# Patient Record
Sex: Male | Born: 1935
Health system: Southern US, Community
[De-identification: ages and names within clinical notes are randomized; demographics above are authoritative.]

## PROBLEM LIST (undated history)

## (undated) ENCOUNTER — Ambulatory Visit: Admission: EM | Payer: Medicare HMO | Source: Home / Self Care

## (undated) DIAGNOSIS — E7800 Pure hypercholesterolemia, unspecified: Secondary | ICD-10-CM

## (undated) DIAGNOSIS — H9190 Unspecified hearing loss, unspecified ear: Secondary | ICD-10-CM

## (undated) DIAGNOSIS — K219 Gastro-esophageal reflux disease without esophagitis: Secondary | ICD-10-CM

## (undated) DIAGNOSIS — I10 Essential (primary) hypertension: Secondary | ICD-10-CM

## (undated) DIAGNOSIS — T7840XA Allergy, unspecified, initial encounter: Secondary | ICD-10-CM

## (undated) DIAGNOSIS — E785 Hyperlipidemia, unspecified: Secondary | ICD-10-CM

## (undated) DIAGNOSIS — I219 Acute myocardial infarction, unspecified: Secondary | ICD-10-CM

## (undated) HISTORY — PX: CARDIAC CATHETERIZATION: SHX172

## (undated) HISTORY — PX: FRACTURE SURGERY: SHX138

## (undated) HISTORY — PX: TONSILLECTOMY: SUR1361

## (undated) HISTORY — DX: Allergy, unspecified, initial encounter: T78.40XA

## (undated) HISTORY — DX: Hyperlipidemia, unspecified: E78.5

## (undated) HISTORY — DX: Acute myocardial infarction, unspecified: I21.9

## (undated) HISTORY — DX: Essential (primary) hypertension: I10

---

## 1998-04-03 ENCOUNTER — Ambulatory Visit (HOSPITAL_COMMUNITY): Admission: RE | Admit: 1998-04-03 | Discharge: 1998-04-03 | Payer: Self-pay

## 1998-10-13 ENCOUNTER — Ambulatory Visit (HOSPITAL_COMMUNITY): Admission: RE | Admit: 1998-10-13 | Discharge: 1998-10-13 | Payer: Self-pay | Admitting: *Deleted

## 2004-10-23 ENCOUNTER — Inpatient Hospital Stay (HOSPITAL_COMMUNITY): Admission: EM | Admit: 2004-10-23 | Discharge: 2004-10-25 | Payer: Self-pay | Admitting: Emergency Medicine

## 2004-11-09 ENCOUNTER — Emergency Department (HOSPITAL_COMMUNITY): Admission: EM | Admit: 2004-11-09 | Discharge: 2004-11-09 | Payer: Self-pay | Admitting: Emergency Medicine

## 2004-11-13 ENCOUNTER — Ambulatory Visit (HOSPITAL_COMMUNITY): Admission: RE | Admit: 2004-11-13 | Discharge: 2004-11-13 | Payer: Self-pay | Admitting: Cardiology

## 2004-11-25 ENCOUNTER — Ambulatory Visit: Payer: Self-pay | Admitting: Cardiology

## 2004-11-29 ENCOUNTER — Ambulatory Visit (HOSPITAL_COMMUNITY): Admission: RE | Admit: 2004-11-29 | Discharge: 2004-11-30 | Payer: Self-pay | Admitting: Cardiology

## 2005-01-15 ENCOUNTER — Ambulatory Visit: Payer: Self-pay | Admitting: Cardiology

## 2005-04-09 ENCOUNTER — Ambulatory Visit: Payer: Self-pay | Admitting: Cardiology

## 2005-06-25 ENCOUNTER — Ambulatory Visit: Payer: Self-pay | Admitting: Cardiology

## 2005-07-10 ENCOUNTER — Ambulatory Visit: Payer: Self-pay | Admitting: Cardiology

## 2005-09-24 ENCOUNTER — Emergency Department (HOSPITAL_COMMUNITY): Admission: EM | Admit: 2005-09-24 | Discharge: 2005-09-25 | Payer: Self-pay | Admitting: Emergency Medicine

## 2005-10-10 ENCOUNTER — Ambulatory Visit: Payer: Self-pay | Admitting: Cardiology

## 2006-01-12 ENCOUNTER — Ambulatory Visit: Payer: Self-pay | Admitting: Cardiology

## 2012-09-27 ENCOUNTER — Emergency Department (HOSPITAL_COMMUNITY): Payer: Medicare Other

## 2012-09-27 ENCOUNTER — Encounter (HOSPITAL_COMMUNITY): Payer: Self-pay | Admitting: *Deleted

## 2012-09-27 ENCOUNTER — Emergency Department (HOSPITAL_COMMUNITY)
Admission: EM | Admit: 2012-09-27 | Discharge: 2012-09-27 | Disposition: A | Discharge status: Home / Self Care | Payer: Medicare Other | Attending: Emergency Medicine | Admitting: Emergency Medicine

## 2012-09-27 DIAGNOSIS — Z79899 Other long term (current) drug therapy: Secondary | ICD-10-CM | POA: Insufficient documentation

## 2012-09-27 DIAGNOSIS — Z7982 Long term (current) use of aspirin: Secondary | ICD-10-CM | POA: Insufficient documentation

## 2012-09-27 DIAGNOSIS — Z7902 Long term (current) use of antithrombotics/antiplatelets: Secondary | ICD-10-CM | POA: Insufficient documentation

## 2012-09-27 DIAGNOSIS — M549 Dorsalgia, unspecified: Secondary | ICD-10-CM | POA: Insufficient documentation

## 2012-09-27 DIAGNOSIS — M519 Unspecified thoracic, thoracolumbar and lumbosacral intervertebral disc disorder: Secondary | ICD-10-CM

## 2012-09-27 DIAGNOSIS — M5137 Other intervertebral disc degeneration, lumbosacral region: Secondary | ICD-10-CM | POA: Insufficient documentation

## 2012-09-27 DIAGNOSIS — M51379 Other intervertebral disc degeneration, lumbosacral region without mention of lumbar back pain or lower extremity pain: Secondary | ICD-10-CM | POA: Insufficient documentation

## 2012-09-27 LAB — CBC WITH DIFFERENTIAL/PLATELET
Basophils Absolute: 0 10*3/uL (ref 0.0–0.1)
Eosinophils Absolute: 0 10*3/uL (ref 0.0–0.7)
Eosinophils Relative: 0 % (ref 0–5)
HCT: 36.2 % — ABNORMAL LOW (ref 39.0–52.0)
MCH: 27.6 pg (ref 26.0–34.0)
MCHC: 33.1 g/dL (ref 30.0–36.0)
MCV: 83.4 fL (ref 78.0–100.0)
Monocytes Absolute: 0.5 10*3/uL (ref 0.1–1.0)
Platelets: 137 10*3/uL — ABNORMAL LOW (ref 150–400)
RDW: 13.2 % (ref 11.5–15.5)

## 2012-09-27 LAB — URINALYSIS, ROUTINE W REFLEX MICROSCOPIC
Bilirubin Urine: NEGATIVE
Hgb urine dipstick: NEGATIVE
Nitrite: NEGATIVE
Specific Gravity, Urine: 1.017 (ref 1.005–1.030)
Urobilinogen, UA: 0.2 mg/dL (ref 0.0–1.0)
pH: 7 (ref 5.0–8.0)

## 2012-09-27 LAB — COMPREHENSIVE METABOLIC PANEL
ALT: 15 U/L (ref 0–53)
AST: 20 U/L (ref 0–37)
Calcium: 8.9 mg/dL (ref 8.4–10.5)
Creatinine, Ser: 0.66 mg/dL (ref 0.50–1.35)
GFR calc Af Amer: >90 mL/min (ref >90)
Sodium: 139 mEq/L (ref 135–145)
Total Protein: 6.5 g/dL (ref 6.0–8.3)

## 2012-09-27 MED ORDER — KETOROLAC TROMETHAMINE 30 MG/ML IJ SOLN
15.0000 mg | Freq: Once | INTRAMUSCULAR | Status: AC
  Administered 2012-09-27: 15 mg via INTRAVENOUS
  Filled 2012-09-27: qty 1

## 2012-09-27 MED ORDER — HYDROCODONE-ACETAMINOPHEN 5-325 MG PO TABS: 1.0000 | ORAL_TABLET | Freq: Four times a day (QID) | ORAL | Status: DC | PRN

## 2012-09-27 MED ORDER — ONDANSETRON HCL 4 MG/2ML IJ SOLN
4.0000 mg | Freq: Once | INTRAMUSCULAR | Status: AC
  Administered 2012-09-27: 4 mg via INTRAVENOUS
  Filled 2012-09-27: qty 2

## 2012-09-27 MED ORDER — MORPHINE SULFATE 4 MG/ML IJ SOLN
4.0000 mg | Freq: Once | INTRAMUSCULAR | Status: AC
  Administered 2012-09-27: 4 mg via INTRAVENOUS
  Filled 2012-09-27: qty 1

## 2012-09-27 MED ORDER — CYCLOBENZAPRINE HCL 10 MG PO TABS: 10.0000 mg | ORAL_TABLET | Freq: Two times a day (BID) | ORAL | Status: DC | PRN

## 2012-09-27 NOTE — ED Notes (Signed)
PT called EMS for Flank pain that did not resolve. Pt has had same pain in oast. On arrival Pt A/O ,no acute distress noted.

## 2012-09-27 NOTE — ED Provider Notes (Addendum)
History     CSN: [id number]  Arrival date & time 09/27/12  1346   First MD Initiated Contact with Patient 09/27/12 1405      Chief Complaint  Patient presents with  . Flank Pain    LT    (Consider location/radiation/quality/duration/timing/severity/associated sxs/prior treatment) Patient is a 77 y.o. male presenting with flank pain. The history is provided by the patient.  Flank Pain This is a new problem. The current episode started 3 to 5 hours ago. The problem occurs constantly. The problem has not changed since onset.Pertinent negatives include no chest pain, no abdominal pain and no shortness of breath. Exacerbated by: lying flat. Nothing relieves the symptoms. Treatments tried: cranberry pills and tylenol. The treatment provided no relief.    No past medical history on file.  No past surgical history on file.  No family history on file.  History  Substance Use Topics  . Smoking status: Not on file  . Smokeless tobacco: Not on file  . Alcohol Use: Not on file      Review of Systems  Constitutional: Negative for fever.  Respiratory: Negative for shortness of breath.   Cardiovascular: Negative for chest pain.  Gastrointestinal: Negative for abdominal pain.  Genitourinary: Positive for flank pain.  All other systems reviewed and are negative.    Allergies  Review of patient's allergies indicates no known allergies.  Home Medications   Current Outpatient Rx  Name  Route  Sig  Dispense  Refill  . ACETAMINOPHEN ER 650 MG PO TBCR   Oral   Take 1,300 mg by mouth every 8 (eight) hours as needed. For pain         . ASPIRIN EC 81 MG PO TBEC   Oral   Take 81 mg by mouth daily.         Marland Kitchen VITAMIN D 1000 UNITS PO TABS   Oral   Take 1,000 Units by mouth daily.         Marland Kitchen CLOPIDOGREL BISULFATE 75 MG PO TABS   Oral   Take 75 mg by mouth every other day.         Marland Kitchen LANSOPRAZOLE 30 MG PO CPDR   Oral   Take 30 mg by mouth 2 (two) times daily.         Marland Kitchen  METOPROLOL TARTRATE 50 MG PO TABS   Oral   Take 25 mg by mouth daily.         . ADULT MULTIVITAMIN W/MINERALS CH   Oral   Take 1 tablet by mouth daily.         . OMEGA-3-ACID ETHYL ESTERS 1 G PO CAPS   Oral   Take 2 g by mouth 2 (two) times daily.         Marland Kitchen RAMIPRIL 2.5 MG PO CAPS   Oral   Take 2.5 mg by mouth daily.         Marland Kitchen ROSUVASTATIN CALCIUM 10 MG PO TABS   Oral   Take 10 mg by mouth daily.           BP 129/57  Pulse 68  Temp 97 F (36.1 C) (Oral)  Resp 20  SpO2 93%  Physical Exam  Nursing note and vitals reviewed. Constitutional: He is oriented to person, place, and time. He appears well-developed and well-nourished. No distress.  HENT:  Head: Normocephalic and atraumatic.  Mouth/Throat: Oropharynx is clear and moist.  Eyes: Conjunctivae normal and EOM are normal. Pupils are equal, round,  and reactive to light.  Neck: Normal range of motion. Neck supple.  Cardiovascular: Normal rate, regular rhythm and intact distal pulses.   No murmur heard. Pulmonary/Chest: Effort normal and breath sounds normal. No respiratory distress. He has no wheezes. He has no rales.  Abdominal: Soft. He exhibits no distension. There is no tenderness. There is no rebound and no guarding. A hernia is present. Hernia confirmed positive in the ventral area.       Easily reducible umbilical hernia  Musculoskeletal: Normal range of motion. He exhibits no edema and no tenderness.       Lumbar back: He exhibits normal range of motion, no bony tenderness and normal pulse.       Back:  Neurological: He is alert and oriented to person, place, and time.  Skin: Skin is warm and dry. No rash noted. No erythema.  Psychiatric: He has a normal mood and affect. His behavior is normal.    ED Course  Procedures (including critical care time)  Labs Reviewed  CBC WITH DIFFERENTIAL - Abnormal; Notable for the following:    Hemoglobin 12.0 (*)     HCT 36.2 (*)     Platelets 137 (*)     All  other components within normal limits  COMPREHENSIVE METABOLIC PANEL - Abnormal; Notable for the following:    Glucose, Bld 115 (*)     All other components within normal limits  URINALYSIS, ROUTINE W REFLEX MICROSCOPIC   Ct Abdomen Pelvis Wo Contrast  09/27/2012  *RADIOLOGY REPORT*  Clinical Data:  Left-sided flank pain since this morning.  History kidney stones.  CT ABDOMEN AND PELVIS WITHOUT CONTRAST (CT UROGRAM)  Technique: Contiguous axial images of the abdomen and pelvis without oral or intravenous contrast were obtained.  Comparison: None  Findings:  Exam is limited for evaluation of entities other than urinary tract calculi due to lack of oral or intravenous contrast.   Lung bases:  Mild scarring at the right lung base.  Normal heart size with multivessel coronary artery atherosclerosis.  Suspect an LAD stent.  Abdomen/pelvis:  Normal uninfused appearance of the liver, spleen, stomach, pancreas, gallbladder, biliary tract, adrenal glands.  No renal calculi or hydronephrosis.  Interpolar left renal lesion which measures 1.1 cm and fluid density, likely a cyst. No hydroureter or ureteric calculi.  No bladder calculi.  No retroperitoneal or retrocrural adenopathy.  Scattered colonic diverticula.  Normal terminal ileum and appendix. Normal small bowel without abdominal ascites.  Small right greater than left bilateral fat containing inguinal hernias.  Mild prostatomegaly, without significant free pelvic fluid.  Fat containing right paramidline ventral abdominal wall hernia on image 57/series 2.  Slight increased density within the herniated fat.  Bones/Musculoskeletal:  No acute osseous abnormality.  Multiple lumbar disc bulges.  IMPRESSION:  1. No urinary tract calculi or hydronephrosis. 2.  No other explanation for left-sided flank pain. 3.  Ventral abdominal wall hernia containing fat.  Increased density within the fat, for which physical exam correlation is suggested to exclude incarceration.    Original Report Authenticated By: Jeronimo Greaves, M.D.      1. Lumbar disc disease   2. Back pain       MDM   Patient with abrupt onset of left-sided flank pain today which did not improve after he took cranberry pills. He states he's had pain with this in the past but never this bad but always went away with cranberry pills. He denies any nausea, vomiting, diarrhea or abdominal pain. There is  no pain radiating down his leg he is able to ambulate without difficulty. Bedside ultrasound showed a normal aorta without any signs of AAA. Ultrasound of the kidney at bedside showed no hydronephrosis. CBC, CMP and UA are all within normal limits without any signs of UTI or hematuria. CBC is also within normal limits. Liver function and kidneys are within normal limits. CT of the abdomen and pelvis without contrast to evaluate for stones is pending. Patient had only minimal improvement after morphine and Toradol with used to see if that'll increase pain relief  5:26 PM CT is negative for stones but does show multiple bulging lumbar vertebral discs which would explain the location and extent of patient's pain.  6:24 PM Pain is improved.  Will d/c home.    Gwyneth Sprout, MD 09/27/12 1725  Gwyneth Sprout, MD 09/27/12 1726  Gwyneth Sprout, MD 09/27/12 [id number]  Gwyneth Sprout, MD 09/27/12 [id number]

## 2015-10-10 ENCOUNTER — Ambulatory Visit (INDEPENDENT_AMBULATORY_CARE_PROVIDER_SITE_OTHER): Payer: Medicare Other | Admitting: Physician Assistant

## 2015-10-10 ENCOUNTER — Encounter: Payer: Self-pay | Admitting: Physician Assistant

## 2015-10-10 VITALS — BP 120/80 | HR 70 | Temp 98.1°F | Ht 72.0 in | Wt 169.5 lb

## 2015-10-10 DIAGNOSIS — I10 Essential (primary) hypertension: Secondary | ICD-10-CM | POA: Diagnosis not present

## 2015-10-10 DIAGNOSIS — Z7189 Other specified counseling: Secondary | ICD-10-CM

## 2015-10-10 DIAGNOSIS — Z0000 Encounter for general adult medical examination without abnormal findings: Secondary | ICD-10-CM

## 2015-10-10 DIAGNOSIS — Z7689 Persons encountering health services in other specified circumstances: Secondary | ICD-10-CM

## 2015-10-10 DIAGNOSIS — E785 Hyperlipidemia, unspecified: Secondary | ICD-10-CM

## 2015-10-10 DIAGNOSIS — I2510 Atherosclerotic heart disease of native coronary artery without angina pectoris: Secondary | ICD-10-CM | POA: Insufficient documentation

## 2015-10-10 DIAGNOSIS — E559 Vitamin D deficiency, unspecified: Secondary | ICD-10-CM | POA: Diagnosis not present

## 2015-10-10 LAB — LIPID PANEL
CHOL/HDL RATIO: 4.2 ratio (ref <=5.0)
CHOLESTEROL: 129 mg/dL (ref 125–200)
HDL: 31 mg/dL — ABNORMAL LOW (ref >=40)
LDL Cholesterol: 46 mg/dL (ref <130)
Triglycerides: 260 mg/dL — ABNORMAL HIGH (ref <150)
VLDL: 52 mg/dL — ABNORMAL HIGH (ref <30)

## 2015-10-10 LAB — CBC WITH DIFFERENTIAL/PLATELET
Basophils Absolute: 0.1 10*3/uL (ref 0.0–0.1)
Basophils Relative: 1 % (ref 0–1)
Eosinophils Absolute: 0.2 10*3/uL (ref 0.0–0.7)
Eosinophils Relative: 4 % (ref 0–5)
HEMATOCRIT: 40.7 % (ref 39.0–52.0)
HEMOGLOBIN: 13.9 g/dL (ref 13.0–17.0)
LYMPHS PCT: 34 % (ref 12–46)
Lymphs Abs: 1.8 10*3/uL (ref 0.7–4.0)
MCH: 29.8 pg (ref 26.0–34.0)
MCHC: 34.2 g/dL (ref 30.0–36.0)
MCV: 87.3 fL (ref 78.0–100.0)
MONO ABS: 0.8 10*3/uL (ref 0.1–1.0)
MONOS PCT: 16 % — AB (ref 3–12)
MPV: 10 fL (ref 8.6–12.4)
NEUTROS ABS: 2.3 10*3/uL (ref 1.7–7.7)
Neutrophils Relative %: 45 % (ref 43–77)
Platelets: 148 10*3/uL — ABNORMAL LOW (ref 150–400)
RBC: 4.66 MIL/uL (ref 4.22–5.81)
RDW: 14.4 % (ref 11.5–15.5)
WBC: 5.2 10*3/uL (ref 4.0–10.5)

## 2015-10-10 LAB — COMPLETE METABOLIC PANEL WITH GFR
ALBUMIN: 3.9 g/dL (ref 3.6–5.1)
ALK PHOS: 53 U/L (ref 40–115)
ALT: 19 U/L (ref 9–46)
AST: 21 U/L (ref 10–35)
BUN: 9 mg/dL (ref 7–25)
CALCIUM: 8.7 mg/dL (ref 8.6–10.3)
CO2: 27 mmol/L (ref 20–31)
Chloride: 106 mmol/L (ref 98–110)
Creat: 0.71 mg/dL (ref 0.70–1.18)
GFR, Est African American: >89 mL/min (ref >=60)
Glucose, Bld: 101 mg/dL — ABNORMAL HIGH (ref 70–99)
POTASSIUM: 4.2 mmol/L (ref 3.5–5.3)
Sodium: 140 mmol/L (ref 135–146)
Total Bilirubin: 1 mg/dL (ref 0.2–1.2)
Total Protein: 6.2 g/dL (ref 6.1–8.1)

## 2015-10-10 NOTE — Progress Notes (Signed)
Patient ID: Randy Thomas MRN: [id number], DOB: Jul 01, 1936 80 y.o. Date of Encounter: 10/10/2015, 12:22 PM    Chief Complaint: Physical (CPE)  HPI: 80 y.o. y/o white male here for CPE.   He is also being seen as a new patient to establish care.  He is wearing a Veteran's cap---asked if he goes to the [facility].  He says that he has been going to the [facility] in [place] but is in the process of trying to find out if he can go to the new [facility] in [place]. However, he only goes to the [facility] for very limited things----such as, he does get his eye glasses there-- because he can get them for free there.  Says that he goes to his "Civilian Doctor for most everything."  However says that his "civilian doctor" is "getting old,  close to 8" so needs a new provider. (sees Dr. Laurian Brim in [place]).  He has a history of CAD. Says he has stent placed by Dr. Einar Gip 2006. Say he was continuing to have follow-up with Dr. Einar Gip annually--- but this past December, patient called and canceled the appointment.  He states that he sees no other specialists.  Reviewed his past medical history and problem list and all of this is up to date and documented below. Per patient--There is no additional past medical history or surgery history. Also reviewed his family medical history but there is no significant family medical history.  I asked him what his daily routine/activity level is. He says that he does all of the vacuuming and laundry and the lawn work. Says his wife likes flowers so he does flowers. Says that he does no "routine exercise program but sometimes he and his wife will go and walk at [facility].  He says that even with this exertion, he has no chest pressure heaviness or tightness in no increased shortness of breath----no symptoms similar to his prior angina.  He says that "the kind of high cholesterol he has has a really long name--- that it is hereditary something or another----and that it is  very high and they have told him if they can just get it down to around 200 they're happy with that"  No complaints or concerns today. Very pleasant man. Very talkative. Even comments that he knows he's a real chatter and talker and his wife tells him that in that he gets that from his mom. !!!   Review of Systems: Consitutional: No fever, chills, fatigue, night sweats, lymphadenopathy, or weight changes. Eyes: No visual changes, eye redness, or discharge. ENT/Mouth: Ears: No otalgia, tinnitus, hearing loss, discharge. Nose: No congestion, rhinorrhea, sinus pain, or epistaxis. Throat: No sore throat, post nasal drip, or teeth pain. Cardiovascular: No CP, palpitations, diaphoresis, DOE, edema, orthopnea, PND. Respiratory: No cough, hemoptysis, SOB, or wheezing. Gastrointestinal: No anorexia, dysphagia, reflux, pain, nausea, vomiting, hematemesis, diarrhea, constipation, BRBPR, or melena. Genitourinary: No dysuria, frequency, urgency, hematuria, incontinence, nocturia, decreased urinary stream, discharge, impotence, or testicular pain/masses. Musculoskeletal: No decreased ROM, myalgias, stiffness, joint swelling, or weakness. Skin: No rash, erythema, lesion changes, pain, warmth, jaundice, or pruritis. Neurological: No headache, dizziness, syncope, seizures, tremors, memory loss, coordination problems, or paresthesias. Psychological: No anxiety, depression, hallucinations, SI/HI. Endocrine: No fatigue, polydipsia, polyphagia, polyuria, or known diabetes. All other systems were reviewed and are otherwise negative.  Past Medical History  Diagnosis Date  . Allergy   . Myocardial infarction ([place])   . Hypertension   . Hyperlipidemia  Past Surgical History  Procedure Laterality Date  . Fracture surgery      1967 wore cast    Home Meds:  Outpatient Prescriptions Prior to Visit  Medication Sig Dispense Refill  . acetaminophen (TYLENOL ARTHRITIS PAIN) 650 MG CR tablet Take 1,300 mg by  mouth every 8 (eight) hours as needed. For pain    . aspirin EC 81 MG tablet Take 81 mg by mouth daily.    . cholecalciferol (VITAMIN D) 1000 UNITS tablet Take 1,000 Units by mouth daily.    . lansoprazole (PREVACID) 30 MG capsule Take 30 mg by mouth 2 (two) times daily.    . Multiple Vitamin (MULTIVITAMIN WITH MINERALS) TABS Take 1 tablet by mouth daily.    Marland Kitchen omega-3 acid ethyl esters (LOVAZA) 1 G capsule Take 2 g by mouth 2 (two) times daily.    . ramipril (ALTACE) 2.5 MG capsule Take 2.5 mg by mouth daily.    . metoprolol (LOPRESSOR) 50 MG tablet Take 25 mg by mouth daily.    . clopidogrel (PLAVIX) 75 MG tablet Take 75 mg by mouth every other day.    . cyclobenzaprine (FLEXERIL) 10 MG tablet Take 1 tablet (10 mg total) by mouth 2 (two) times daily as needed for muscle spasms. 20 tablet 0  . HYDROcodone-acetaminophen (NORCO/VICODIN) 5-325 MG per tablet Take 1 tablet by mouth every 6 (six) hours as needed for pain. 15 tablet 0  . rosuvastatin (CRESTOR) 10 MG tablet Take 10 mg by mouth daily.     No facility-administered medications prior to visit.    Allergies: No Known Allergies  Social History   Social History  . Marital Status: Married    Spouse Name: N/A  . Number of Children: N/A  . Years of Education: N/A   Occupational History  . Not on file.   Social History Main Topics  . Smoking status: Former Smoker    Quit date: 09/15/1981  . Smokeless tobacco: Never Used  . Alcohol Use: No  . Drug Use: No  . Sexual Activity: Not Currently   Other Topics Concern  . Not on file   Social History Narrative    Family History  Problem Relation Age of Onset  . Asthma Mother   . Hearing loss Mother   . Heart disease Father   . Diabetes Brother   . Diabetes Maternal Grandmother     Physical Exam: Blood pressure 120/80, pulse 70, temperature 98.1 F (36.7 C), temperature source Oral, height 6' (1.829 m), weight 169 lb 8 oz (76.885 kg).  General: Well developed, well  nourished, in no acute distress. HEENT: Normocephalic, atraumatic. Conjunctiva pink, sclera non-icteric. Pupils 2 mm constricting to 1 mm, round, regular, and equally reactive to light and accomodation. EOMI. Internal auditory canal clear. TMs with good cone of light and without pathology. Nasal mucosa pink. Nares are without discharge. No sinus tenderness. Oral mucosa pink. Pharynx without exudate.   Neck: Supple. Trachea midline. No thyromegaly. Full ROM. No lymphadenopathy. No carotid bruit. Lungs: Clear to auscultation bilaterally without wheezes, rales, or rhonchi. Breathing is of normal effort and unlabored. Cardiovascular: RRR with S1 S2. No murmurs, rubs, or gallops. Distal pulses 2+ symmetrically. No carotid or abdominal bruits. Abdomen: Soft, non-tender, non-distended with normoactive bowel sounds. No hepatosplenomegaly or masses. No rebound/guarding. No CVA tenderness. No hernias. Musculoskeletal: Full range of motion and 5/5 strength throughout. Without swelling, atrophy, tenderness, crepitus, or warmth. Extremities without clubbing, cyanosis, or edema.  Skin: Warm and moist without erythema,  ecchymosis, wounds, or rash. Neuro: A+Ox3. CN II-XII grossly intact. Moves all extremities spontaneously. Full sensation throughout. Normal gait. DTR 2+ throughout upper and lower extremities.  Psych:  Responds to questions appropriately with a normal affect.   Assessment/Plan:  80 y.o. y/o  white male here for CPE  -1. Visit for preventive health examination  A. Screening Labs: - CBC with Differential/Platelet - COMPLETE METABOLIC PANEL WITH GFR - Lipid panel - TSH - VITAMIN D 25 Hydroxy (Vit-D Deficiency, Fractures) - PSA, Medicare   B. Screening For Prostate Cancer: - PSA, Medicare  C. Screening For Colorectal Cancer:  He states that he did have a colonoscopy around 5-10 years ago. Says this was performed through the [facility]. He states that he keeps medical records at home and will look  through his records and will bring in colonoscopy report.  D. Immunizations: He reports that he has immunization records at home and will bring them in for [place] to put in our records. Says that he knows he had a flu vaccine at the pharmacy October 2016. Says that he has had pneumonia vaccine at the [facility] and even had "the booster". Says that he had to T dap about one year ago. Isn't sure if he had shingles vaccine. Flu Tetanus Pneumococcal Zostaax  2. Coronary artery disease involving native coronary artery of native heart without angina pectoris He is currently stable with no angina even with exertion. At next visit, will encourage him to schedule follow-up with his Cardiologist--Dr. Einar Gip He is on: Aspirin, Plavix, beta blocker, ACE inhibitor, statin  3. Hyperlipidemia He is on Crestor. He is fasting. Will check labs to monitor. - COMPLETE METABOLIC PANEL WITH GFR - Lipid panel  4. Essential hypertension Blood pressure is at goal/controlled. Check lab to monitor. Continue current medications. - COMPLETE METABOLIC PANEL WITH GFR  5. Vitamin D deficiency He is on vitamin D supplements. Will check lab now to monitor. - VITAMIN D 25 Hydroxy (Vit-D Deficiency, Fractures)    Subjective:   Patient presents for Medicare Annual/Subsequent preventive examination.   Review Past Medical/Family/Social: All of this information is reviewed and documented in the Epic tabs as well as in the note above.   Risk Factors  Current exercise habits:  This is documented in the history of present illness above. Dietary issues discussed:  He follows a low-sodium low-cholesterol "heart healthy "diet  Cardiac risk factors:  He has a known history of CAD.  Depression Screen  (Note: if answer to either of the following is "Yes", a more complete depression screening is indicated)  Over the past two weeks, have you felt down, depressed or hopeless? No Over the past two weeks, have you felt little interest or  pleasure in doing things? No Have you lost interest or pleasure in daily life? No Do you often feel hopeless? No Do you cry easily over simple problems? No   Activities of Daily Living  In your present state of health, do you have any difficulty performing the following activities?:  Driving? No  Managing money? No  Feeding yourself? No  Getting from bed to chair? No  Climbing a flight of stairs? No  Preparing food and eating?: No  Bathing or showering? No  Getting dressed: No  Getting to the toilet? No  Using the toilet:No  Moving around from place to place: No  In the past year have you fallen or had a near fall?:No  Are you sexually active? No  Do you have more than  one partner? No   Hearing Difficulties: No  Do you often ask people to speak up or repeat themselves? No  Do you experience ringing or noises in your ears? No Do you have difficulty understanding soft or whispered voices? No  Do you feel that you have a problem with memory? No Do you often misplace items? No  Do you feel safe at home? Yes  Cognitive Testing  Alert? Yes Normal Appearance?Yes  Oriented to person? Yes Place? Yes  Time? Yes  Recall of three objects? Yes  Can perform simple calculations? Yes  Displays appropriate judgment?Yes  Can read the correct time from a watch face?Yes   List the Names of Other Physician/Practitioners you currently use:  Dr. Einar Gip, Cardiology  Indicate any recent Medical Services you may have received from other than [facility] providers in the past year (date may be approximate).  Dr. Einar Gip, Cardiology  Screening Tests / Date----This information is all documented above. See note above. Colonoscopy                     Zostavax  Mammogram  Influenza Vaccine  Tetanus/tdap    Assessment:    Annual wellness medicare exam   Plan:    During the course of the visit the patient was educated and counseled about appropriate screening and preventive services including:    Screening mammography  Colorectal cancer screening  Shingles vaccine. Prescription given to that she can get the vaccine at the pharmacy or Medicare part D.  Screen + for depression. PHQ- 9 score of 12 (moderate depression). We discussed the options of counseling versus possibly a medication. I encouraged her strongly think about the counseling. She is going through some medical problems currently and her husband is as well Mrs. been very stressful for her. She says she will think about it. She does have Xanax to use as needed. Though she may benefit from an SSRI for her more depressive type symptoms but she wants to hold off at this time.  I aksed her to please have her cardioloist send records since we have none on file.  Diet review for nutrition referral? Yes ____ Not Indicated __x__  Patient Instructions (the written plan) was given to the patient.  Medicare Attestation  I have personally reviewed:  The patient's medical and social history  Their use of alcohol, tobacco or illicit drugs  Their current medications and supplements  The patient's functional ability including ADLs,fall risks, home safety risks, cognitive, and hearing and visual impairment  Diet and physical activities  Evidence for depression or mood disorders  The patient's weight, height, BMI, and visual acuity have been recorded in the chart. I have made referrals, counseling, and provided education to the patient based on review of the above and I have provided the patient with a written personalized care plan for preventive services.      Signed:   [address] [place], [place]  10/10/2015 12:22 PM

## 2015-10-11 ENCOUNTER — Encounter: Payer: Self-pay | Admitting: Family Medicine

## 2015-10-11 LAB — TSH: TSH: 2.61 u[IU]/mL (ref 0.350–4.500)

## 2015-10-11 LAB — VITAMIN D 25 HYDROXY (VIT D DEFICIENCY, FRACTURES): VIT D 25 HYDROXY: 33 ng/mL (ref 30–100)

## 2015-10-11 LAB — PSA, MEDICARE: PSA: 0.99 ng/mL (ref <=4.00)

## 2016-02-27 ENCOUNTER — Other Ambulatory Visit: Payer: Self-pay | Admitting: Physician Assistant

## 2016-02-27 NOTE — Telephone Encounter (Signed)
Refill appropriate and filled per protocol. 

## 2016-04-10 ENCOUNTER — Ambulatory Visit: Payer: Medicare Other | Admitting: Physician Assistant

## 2016-06-19 ENCOUNTER — Ambulatory Visit (INDEPENDENT_AMBULATORY_CARE_PROVIDER_SITE_OTHER): Payer: Medicare Other | Admitting: *Deleted

## 2016-06-19 DIAGNOSIS — Z23 Encounter for immunization: Secondary | ICD-10-CM | POA: Diagnosis not present

## 2016-06-19 NOTE — Progress Notes (Signed)
Patient seen in office for Influenza Vaccination.   Tolerated IM administration well.   Immunization history updated.  

## 2016-06-24 ENCOUNTER — Other Ambulatory Visit: Payer: Self-pay | Admitting: Physician Assistant

## 2016-10-15 ENCOUNTER — Encounter: Payer: Self-pay | Admitting: Physician Assistant

## 2016-10-15 ENCOUNTER — Ambulatory Visit (INDEPENDENT_AMBULATORY_CARE_PROVIDER_SITE_OTHER): Payer: Medicare Other | Admitting: Physician Assistant

## 2016-10-15 VITALS — BP 128/72 | HR 72 | Temp 97.5°F | Resp 18 | Wt 163.4 lb

## 2016-10-15 DIAGNOSIS — E559 Vitamin D deficiency, unspecified: Secondary | ICD-10-CM

## 2016-10-15 DIAGNOSIS — E785 Hyperlipidemia, unspecified: Secondary | ICD-10-CM

## 2016-10-15 DIAGNOSIS — Z0000 Encounter for general adult medical examination without abnormal findings: Secondary | ICD-10-CM

## 2016-10-15 DIAGNOSIS — I10 Essential (primary) hypertension: Secondary | ICD-10-CM | POA: Diagnosis not present

## 2016-10-15 DIAGNOSIS — I2510 Atherosclerotic heart disease of native coronary artery without angina pectoris: Secondary | ICD-10-CM | POA: Diagnosis not present

## 2016-10-15 MED ORDER — RAMIPRIL 2.5 MG PO CAPS: 2.5000 mg | ORAL_CAPSULE | Freq: Every day | ORAL | 1 refills | Status: DC

## 2016-10-15 MED ORDER — ROSUVASTATIN CALCIUM 40 MG PO TABS: 40.0000 mg | ORAL_TABLET | Freq: Every day | ORAL | 0 refills | Status: DC

## 2016-10-15 MED ORDER — METHOCARBAMOL 750 MG PO TABS: 750.0000 mg | ORAL_TABLET | Freq: Four times a day (QID) | ORAL | 5 refills | Status: DC

## 2016-10-15 NOTE — Progress Notes (Signed)
Patient ID: Randy Thomas MRN: [id number], DOB: 08/10/1936 81 y.o. Date of Encounter: 10/15/2016, 3:24 PM    Chief Complaint: Physical (CPE)  HPI: 81 y.o. y/o white male here for CPE.    09/2015: He is also being seen as a new patient to establish care.  He is wearing a Veteran's cap---asked if he goes to the [facility].  He says that he has been going to the [facility] in [place] but is in the process of trying to find out if he can go to the new [facility] in [place]. However, he only goes to the [facility] for very limited things----such as, he does get his eye glasses there-- because he can get them for free there.  Says that he goes to his "Civilian Doctor for most everything."  However says that his "civilian doctor" is "getting old,  close to 18" so needs a new provider. (sees Dr. Laurian Brim in [place]).  He has a history of CAD. Says he has stent placed by Dr. Einar Gip 2006. Say he was continuing to have follow-up with Dr. Einar Gip annually--- but this past December, patient called and canceled the appointment.  He states that he sees no other specialists.  Reviewed his past medical history and problem list and all of this is up to date and documented below. Per patient--There is no additional past medical history or surgery history. Also reviewed his family medical history but there is no significant family medical history.  I asked him what his daily routine/activity level is. He says that he does all of the vacuuming and laundry and the lawn work. Says his wife likes flowers so he does flowers. Says that he does no "routine exercise program but sometimes he and his wife will go and walk at [facility].  He says that even with this exertion, he has no chest pressure heaviness or tightness in no increased shortness of breath----no symptoms similar to his prior angina.  He says that "the kind of high cholesterol he has has a really long name--- that it is hereditary something or another----and  that it is very high and they have told him if they can just get it down to around 200 they're happy with that"  No complaints or concerns today. Very pleasant man. Very talkative. Even comments that he knows he's a real chatter and talker and his wife tells him that in that he gets that from his mom. !!!   10/15/2016:  Says that he has been having no chest pressure, no chest heaviness, no chest tightness. No increased SOB or DOE.  Still stays active---vacuuming, working outside in the yard.  Ran out of Crestor ~  Month ago. Was having no myalgias or other adverse effects---just ran out of refills. Taking ramipril and toprol daily. No lightheadedness or other adv effects. Says he saw Dr. Einar Gip for OV Sept 2017. Says he had him do carotid dopplers. Had no other cardiac tests this year.  Dr. Einar Gip does labs when he sees pt. Therefore, I will continue to just see patient for routine visit once a year and perform labs here once a year. Dr. Einar Gip will cont to monitor labs during the interval between. Reports that he occasionally gets some low back pain. Says that in the past he was prescribed Robaxin for this and it has worked well and is requesting refill on this. No other concerns to discuss today.  Review of Systems: Consitutional: No fever, chills, fatigue, night sweats, lymphadenopathy, or weight  changes. Eyes: No visual changes, eye redness, or discharge. ENT/Mouth: Ears: No otalgia, tinnitus, hearing loss, discharge. Nose: No congestion, rhinorrhea, sinus pain, or epistaxis. Throat: No sore throat, post nasal drip, or teeth pain. Cardiovascular: No CP, palpitations, diaphoresis, DOE, edema, orthopnea, PND. Respiratory: No cough, hemoptysis, SOB, or wheezing. Gastrointestinal: No anorexia, dysphagia, reflux, pain, nausea, vomiting, hematemesis, diarrhea, constipation, BRBPR, or melena. Genitourinary: No dysuria, frequency, urgency, hematuria, incontinence, nocturia, decreased urinary stream,  discharge, impotence, or testicular pain/masses. Musculoskeletal: No decreased ROM, myalgias, stiffness, joint swelling, or weakness. Skin: No rash, erythema, lesion changes, pain, warmth, jaundice, or pruritis. Neurological: No headache, dizziness, syncope, seizures, tremors, memory loss, coordination problems, or paresthesias. Psychological: No anxiety, depression, hallucinations, SI/HI. Endocrine: No fatigue, polydipsia, polyphagia, polyuria, or known diabetes. All other systems were reviewed and are otherwise negative.  Past Medical History:  Diagnosis Date  . Allergy   . Hyperlipidemia   . Hypertension   . Myocardial infarction      Past Surgical History:  Procedure Laterality Date  . FRACTURE SURGERY     1967 wore cast    Home Meds:  Outpatient Medications Prior to Visit  Medication Sig Dispense Refill  . acetaminophen (TYLENOL ARTHRITIS PAIN) 650 MG CR tablet Take 1,300 mg by mouth every 8 (eight) hours as needed. For pain    . aspirin EC 81 MG tablet Take 81 mg by mouth daily.    . cholecalciferol (VITAMIN D) 1000 UNITS tablet Take 1,000 Units by mouth daily.    . lansoprazole (PREVACID) 30 MG capsule Take 30 mg by mouth 2 (two) times daily.    . metoprolol succinate (TOPROL-XL) 50 MG 24 hr tablet TAKE 1/2 TABLET BY MOUTH IN THE MORNING AND 1/2 TABLET BY MOUTH IN THE EVENING  4  . Multiple Vitamin (MULTIVITAMIN WITH MINERALS) TABS Take 1 tablet by mouth daily.    Marland Kitchen omega-3 acid ethyl esters (LOVAZA) 1 G capsule Take 2 g by mouth 2 (two) times daily.    . ramipril (ALTACE) 2.5 MG capsule take 1 capsule by mouth once daily 90 capsule 0  . rosuvastatin (CRESTOR) 20 MG tablet Take 20 mg by mouth daily.     No facility-administered medications prior to visit.     Allergies: No Known Allergies  Social History   Social History  . Marital status: Married    Spouse name: N/A  . Number of children: N/A  . Years of education: N/A   Occupational History  . Not on file.    Social History Main Topics  . Smoking status: Former Smoker    Quit date: 09/15/1981  . Smokeless tobacco: Never Used  . Alcohol use No  . Drug use: No  . Sexual activity: Not Currently   Other Topics Concern  . Not on file   Social History Narrative  . No narrative on file    Family History  Problem Relation Age of Onset  . Asthma Mother   . Hearing loss Mother   . Heart disease Father   . Diabetes Brother   . Diabetes Maternal Grandmother     Physical Exam: Blood pressure 128/72, pulse 72, temperature 97.5 F (36.4 C), temperature source Oral, resp. rate 18, weight 163 lb 6.4 oz (74.1 kg), SpO2 96 %.  General: Well developed, well nourished, in no acute distress. HEENT: Normocephalic, atraumatic. Conjunctiva pink, sclera non-icteric. Pupils 2 mm constricting to 1 mm, round, regular, and equally reactive to light and accomodation. EOMI. Internal auditory canal clear. TMs with  good cone of light and without pathology. Nasal mucosa pink. Nares are without discharge. No sinus tenderness. Oral mucosa pink. Pharynx without exudate.   Neck: Supple. Trachea midline. No thyromegaly. Full ROM. No lymphadenopathy.  Soft carotid bruit. Lungs: Clear to auscultation bilaterally without wheezes, rales, or rhonchi. Breathing is of normal effort and unlabored. Cardiovascular: RRR with S1 S2. No murmurs, rubs, or gallops.  No LE edema. 2+ Posterior Tibial Pulses Bilaterally. Abdomen: Soft, non-tender, non-distended with normoactive bowel sounds. No hepatosplenomegaly or masses. No rebound/guarding. No CVA tenderness.  Small umbilical hernia.  Musculoskeletal: Full range of motion and 5/5 strength throughout. Without swelling, atrophy, tenderness, crepitus, or warmth. Extremities without clubbing, cyanosis, or edema.  Skin: Warm and moist without erythema, ecchymosis, wounds, or rash. Neuro: A+Ox3. CN II-XII grossly intact. Moves all extremities spontaneously. Full sensation throughout. Normal  gait. DTR 2+ throughout upper and lower extremities.  Psych:  Responds to questions appropriately with a normal affect.   Assessment/Plan:  81 y.o. y/o  white male here for CPE  -1. Visit for preventive health examination  A. Screening Labs: - CBC with Differential/Platelet - COMPLETE METABOLIC PANEL WITH GFR - Lipid panel - TSH - VITAMIN D 25 Hydroxy (Vit-D Deficiency, Fractures)    B. Screening For Prostate Cancer: - Age 43. Will stop checking PSAs  C. Screening For Colorectal Cancer:  He states that he did have a colonoscopy around 5-10 years ago. Says this was performed through the [facility]. He states that he keeps medical records at home and will look through his records and will bring in colonoscopy report.  D. Immunizations: He reports that he has immunization records at home and will bring them in for [place] to put in our records. Says that he knows he had a flu vaccine at the pharmacy October 2016. Says that he has had pneumonia vaccine at the [facility] and even had "the booster". Says that he had to T dap about one year ago. Isn't sure if he had shingles vaccine. Flu---He came here and received Flu vaccine 2017--2018 Tetanus Pneumococcal Zostaax  2. Coronary artery disease involving native coronary artery of native heart without angina pectoris He is currently stable with no angina even with exertion. He has had follow-up with his Cardiologist--Dr. Einar Gip He is on: Aspirin, beta blocker, ACE inhibitor, statin  3. Hyperlipidemia He is on Crestor. He is fasting. Will check labs to monitor. - COMPLETE METABOLIC PANEL WITH GFR - Lipid panel Dr. Einar Gip does labs when he sees pt. Therefore, I will continue to just see patient for routine visit once a year and perform labs here once a year. Dr. Einar Gip will cont to monitor labs during the interval between.  4. Essential hypertension Blood pressure is at goal/controlled. Check lab to monitor. Continue current medications. - COMPLETE METABOLIC  PANEL WITH GFR  5. Vitamin D deficiency He is on vitamin D supplements. Will check lab now to monitor. - VITAMIN D 25 Hydroxy (Vit-D Deficiency, Fractures)  6. Low Back Pain --Robaxin PRN  Dr. Einar Gip does labs when he sees pt. Therefore, I will continue to just see patient for routine visit once a year and perform labs here once a year. Dr. Einar Gip will cont to monitor labs during the interval between.   Subjective:   Patient presents for Medicare Annual/Subsequent preventive examination.   Review Past Medical/Family/Social: All of this information is reviewed and documented in the Epic tabs as well as in the note above.   Risk Factors  Current exercise  habits:  This is documented in the history of present illness above. Dietary issues discussed:  He follows a low-sodium low-cholesterol "heart healthy "diet  Cardiac risk factors:  He has a known history of CAD.  Depression Screen  (Note: if answer to either of the following is "Yes", a more complete depression screening is indicated)  Over the past two weeks, have you felt down, depressed or hopeless? No Over the past two weeks, have you felt little interest or pleasure in doing things? No Have you lost interest or pleasure in daily life? No Do you often feel hopeless? No Do you cry easily over simple problems? No   Activities of Daily Living  In your present state of health, do you have any difficulty performing the following activities?:  Driving? No  Managing money? No  Feeding yourself? No  Getting from bed to chair? No  Climbing a flight of stairs? No  Preparing food and eating?: No  Bathing or showering? No  Getting dressed: No  Getting to the toilet? No  Using the toilet:No  Moving around from place to place: No  In the past year have you fallen or had a near fall?:No  Are you sexually active? No  Do you have more than one partner? No   Hearing Difficulties: No  Do you often ask people to speak up or repeat  themselves? No  Do you experience ringing or noises in your ears? No Do you have difficulty understanding soft or whispered voices? No  Do you feel that you have a problem with memory? No Do you often misplace items? No  Do you feel safe at home? Yes  Cognitive Testing  Alert? Yes Normal Appearance?Yes  Oriented to person? Yes Place? Yes  Time? Yes  Recall of three objects? Yes  Can perform simple calculations? Yes  Displays appropriate judgment?Yes  Can read the correct time from a watch face?Yes   List the Names of Other Physician/Practitioners you currently use:  Dr. Einar Gip, Cardiology  Indicate any recent Medical Services you may have received from other than [facility] providers in the past year (date may be approximate).  Dr. Einar Gip, Cardiology  Screening Tests / Date----This information is all documented above. See note above. Colonoscopy                     Zostavax  Mammogram  Influenza Vaccine  Tetanus/tdap    Assessment:    Annual wellness medicare exam   Plan:    During the course of the visit the patient was educated and counseled about appropriate screening and preventive services including:  Screening mammography  Colorectal cancer screening  Shingles vaccine. Prescription given to that she can get the vaccine at the pharmacy or Medicare part D.  Screen + for depression. PHQ- 9 score of 12 (moderate depression). We discussed the options of counseling versus possibly a medication. I encouraged her strongly think about the counseling. She is going through some medical problems currently and her husband is as well Mrs. been very stressful for her. She says she will think about it. She does have Xanax to use as needed. Though she may benefit from an SSRI for her more depressive type symptoms but she wants to hold off at this time.  I aksed her to please have her cardioloist send records since we have none on file.  Diet review for nutrition referral? Yes ____ Not  Indicated __x__  Patient Instructions (the written plan) was given to  the patient.  Medicare Attestation  I have personally reviewed:  The patient's medical and social history  Their use of alcohol, tobacco or illicit drugs  Their current medications and supplements  The patient's functional ability including ADLs,fall risks, home safety risks, cognitive, and hearing and visual impairment  Diet and physical activities  Evidence for depression or mood disorders  The patient's weight, height, BMI, and visual acuity have been recorded in the chart. I have made referrals, counseling, and provided education to the patient based on review of the above and I have provided the patient with a written personalized care plan for preventive services.      Signed:   [address] [place], [place]  10/15/2016 3:24 PM

## 2016-10-17 ENCOUNTER — Other Ambulatory Visit: Payer: Medicare Other

## 2016-10-17 DIAGNOSIS — Z0000 Encounter for general adult medical examination without abnormal findings: Secondary | ICD-10-CM

## 2016-10-17 DIAGNOSIS — E785 Hyperlipidemia, unspecified: Secondary | ICD-10-CM

## 2016-10-17 DIAGNOSIS — E559 Vitamin D deficiency, unspecified: Secondary | ICD-10-CM

## 2016-10-17 DIAGNOSIS — I10 Essential (primary) hypertension: Secondary | ICD-10-CM

## 2016-10-17 LAB — TSH: TSH: 2.09 mIU/L (ref 0.40–4.50)

## 2016-10-17 LAB — CBC WITH DIFFERENTIAL/PLATELET
BASOS ABS: 53 {cells}/uL (ref 0–200)
Basophils Relative: 1 %
Eosinophils Absolute: 159 cells/uL (ref 15–500)
Eosinophils Relative: 3 %
HEMATOCRIT: 41.8 % (ref 38.5–50.0)
HEMOGLOBIN: 13.9 g/dL (ref 13.0–17.0)
Lymphocytes Relative: 27 %
Lymphs Abs: 1431 cells/uL (ref 850–3900)
MCH: 29.2 pg (ref 27.0–33.0)
MCHC: 33.3 g/dL (ref 32.0–36.0)
MCV: 87.8 fL (ref 80.0–100.0)
MONO ABS: 795 {cells}/uL (ref 200–950)
MPV: 10.4 fL (ref 7.5–12.5)
Monocytes Relative: 15 %
NEUTROS PCT: 54 %
Neutro Abs: 2862 cells/uL (ref 1500–7800)
Platelets: 152 10*3/uL (ref 140–400)
RBC: 4.76 MIL/uL (ref 4.20–5.80)
RDW: 14.1 % (ref 11.0–15.0)
WBC: 5.3 10*3/uL (ref 3.8–10.8)

## 2016-10-17 LAB — COMPLETE METABOLIC PANEL WITH GFR
ALBUMIN: 3.9 g/dL (ref 3.6–5.1)
ALK PHOS: 71 U/L (ref 40–115)
ALT: 17 U/L (ref 9–46)
AST: 18 U/L (ref 10–35)
BUN: 14 mg/dL (ref 7–25)
CALCIUM: 8.9 mg/dL (ref 8.6–10.3)
CHLORIDE: 105 mmol/L (ref 98–110)
CO2: 28 mmol/L (ref 20–31)
Creat: 0.8 mg/dL (ref 0.70–1.18)
GFR, EST NON AFRICAN AMERICAN: 86 mL/min (ref >=60)
Glucose, Bld: 112 mg/dL — ABNORMAL HIGH (ref 70–99)
POTASSIUM: 4.4 mmol/L (ref 3.5–5.3)
Sodium: 139 mmol/L (ref 135–146)
Total Bilirubin: 0.5 mg/dL (ref 0.2–1.2)
Total Protein: 6.1 g/dL (ref 6.1–8.1)

## 2016-10-17 LAB — LIPID PANEL
CHOL/HDL RATIO: 8 ratio — AB (ref <5.0)
CHOLESTEROL: 175 mg/dL (ref <200)
HDL: 22 mg/dL — ABNORMAL LOW (ref >40)
LDL Cholesterol: 75 mg/dL (ref <100)
TRIGLYCERIDES: 391 mg/dL — AB (ref <150)
VLDL: 78 mg/dL — AB (ref <30)

## 2016-10-18 LAB — VITAMIN D 25 HYDROXY (VIT D DEFICIENCY, FRACTURES): VIT D 25 HYDROXY: 33 ng/mL (ref 30–100)

## 2016-11-19 ENCOUNTER — Other Ambulatory Visit: Payer: Self-pay

## 2016-11-19 MED ORDER — OMEGA-3-ACID ETHYL ESTERS 1 G PO CAPS: 2.0000 g | ORAL_CAPSULE | Freq: Two times a day (BID) | ORAL | 3 refills | Status: DC

## 2016-11-19 MED ORDER — ASPIRIN EC 81 MG PO TBEC: 81.0000 mg | DELAYED_RELEASE_TABLET | Freq: Every day | ORAL | 3 refills | Status: DC

## 2016-11-19 MED ORDER — LANSOPRAZOLE 30 MG PO CPDR: 30.0000 mg | DELAYED_RELEASE_CAPSULE | Freq: Two times a day (BID) | ORAL | 3 refills | Status: DC

## 2016-11-19 MED ORDER — ROSUVASTATIN CALCIUM 40 MG PO TABS: 40.0000 mg | ORAL_TABLET | Freq: Every day | ORAL | 0 refills | Status: DC

## 2016-11-19 MED ORDER — ACETAMINOPHEN ER 650 MG PO TBCR: 1300.0000 mg | EXTENDED_RELEASE_TABLET | Freq: Three times a day (TID) | ORAL | 2 refills | Status: DC | PRN

## 2016-11-19 MED ORDER — METHOCARBAMOL 750 MG PO TABS: 750.0000 mg | ORAL_TABLET | Freq: Four times a day (QID) | ORAL | 5 refills | Status: DC

## 2016-11-19 MED ORDER — METOPROLOL SUCCINATE ER 50 MG PO TB24: ORAL_TABLET | ORAL | 4 refills | Status: DC

## 2016-11-19 MED ORDER — RAMIPRIL 2.5 MG PO CAPS: 2.5000 mg | ORAL_CAPSULE | Freq: Every day | ORAL | 1 refills | Status: DC

## 2016-11-19 MED ORDER — VITAMIN D 1000 UNITS PO TABS: 1000.0000 [IU] | ORAL_TABLET | Freq: Every day | ORAL | 3 refills | Status: DC

## 2016-11-19 MED ORDER — ADULT MULTIVITAMIN W/MINERALS CH: 1.0000 | ORAL_TABLET | Freq: Every day | ORAL | 3 refills | Status: DC

## 2016-11-19 NOTE — Telephone Encounter (Signed)
Patient requested that his medications be sent to [facility] and they only fax Rx's

## 2016-12-04 ENCOUNTER — Telehealth: Payer: Self-pay | Admitting: Physician Assistant

## 2016-12-04 NOTE — Telephone Encounter (Signed)
pts wife wants to know if we got in contact with the [facility] about her husbands med list. Please call back

## 2016-12-04 NOTE — Telephone Encounter (Signed)
Spoke with Randy Thomas and expalined that I sent  Faxed his OV'A over on 11-19-2016.  Randy Thomas stated he would call [facility] to confirm they received it

## 2017-01-06 ENCOUNTER — Encounter: Payer: Self-pay | Admitting: Physician Assistant

## 2017-02-10 ENCOUNTER — Other Ambulatory Visit: Payer: Self-pay | Admitting: Physician Assistant

## 2017-03-25 ENCOUNTER — Encounter: Payer: Self-pay | Admitting: Physician Assistant

## 2017-05-19 ENCOUNTER — Encounter: Payer: Self-pay | Admitting: Physician Assistant

## 2017-06-03 ENCOUNTER — Ambulatory Visit (INDEPENDENT_AMBULATORY_CARE_PROVIDER_SITE_OTHER): Payer: Medicare Other

## 2017-06-03 DIAGNOSIS — Z23 Encounter for immunization: Secondary | ICD-10-CM | POA: Diagnosis not present

## 2017-06-03 NOTE — Progress Notes (Signed)
Patient was seen in office for flu vaccine. patient received vaccine in right deltoid pt tolerated well

## 2018-02-10 ENCOUNTER — Other Ambulatory Visit: Payer: Self-pay

## 2018-02-10 ENCOUNTER — Encounter: Payer: Self-pay | Admitting: Physician Assistant

## 2018-02-10 ENCOUNTER — Ambulatory Visit (INDEPENDENT_AMBULATORY_CARE_PROVIDER_SITE_OTHER): Payer: Medicare HMO | Admitting: Physician Assistant

## 2018-02-10 VITALS — BP 120/60 | HR 60 | Temp 97.7°F | Resp 14 | Ht 66.0 in | Wt 171.0 lb

## 2018-02-10 DIAGNOSIS — I2510 Atherosclerotic heart disease of native coronary artery without angina pectoris: Secondary | ICD-10-CM

## 2018-02-10 DIAGNOSIS — E785 Hyperlipidemia, unspecified: Secondary | ICD-10-CM

## 2018-02-10 DIAGNOSIS — I10 Essential (primary) hypertension: Secondary | ICD-10-CM

## 2018-02-10 DIAGNOSIS — K429 Umbilical hernia without obstruction or gangrene: Secondary | ICD-10-CM

## 2018-02-10 DIAGNOSIS — Z0000 Encounter for general adult medical examination without abnormal findings: Secondary | ICD-10-CM | POA: Diagnosis not present

## 2018-02-10 LAB — CBC WITH DIFFERENTIAL/PLATELET
Basophils Absolute: 71 cells/uL (ref 0–200)
Basophils Relative: 1.2 %
EOS PCT: 4.6 %
Eosinophils Absolute: 271 cells/uL (ref 15–500)
HEMATOCRIT: 40.7 % (ref 38.5–50.0)
Hemoglobin: 13.8 g/dL (ref 13.2–17.1)
LYMPHS ABS: 1682 {cells}/uL (ref 850–3900)
MCH: 29.3 pg (ref 27.0–33.0)
MCHC: 33.9 g/dL (ref 32.0–36.0)
MCV: 86.4 fL (ref 80.0–100.0)
MONOS PCT: 14.5 %
MPV: 10.8 fL (ref 7.5–12.5)
Neutro Abs: 3021 cells/uL (ref 1500–7800)
Neutrophils Relative %: 51.2 %
PLATELETS: 149 10*3/uL (ref 140–400)
RBC: 4.71 10*6/uL (ref 4.20–5.80)
RDW: 13.3 % (ref 11.0–15.0)
Total Lymphocyte: 28.5 %
WBC mixed population: 856 cells/uL (ref 200–950)
WBC: 5.9 10*3/uL (ref 3.8–10.8)

## 2018-02-10 LAB — COMPLETE METABOLIC PANEL WITH GFR
AG Ratio: 2.2 (calc) (ref 1.0–2.5)
ALT: 18 U/L (ref 9–46)
AST: 20 U/L (ref 10–35)
Albumin: 4.3 g/dL (ref 3.6–5.1)
Alkaline phosphatase (APISO): 75 U/L (ref 40–115)
BUN: 16 mg/dL (ref 7–25)
CALCIUM: 9.1 mg/dL (ref 8.6–10.3)
CO2: 29 mmol/L (ref 20–32)
CREATININE: 0.79 mg/dL (ref 0.70–1.18)
Chloride: 105 mmol/L (ref 98–110)
GFR, EST NON AFRICAN AMERICAN: 85 mL/min/{1.73_m2} (ref >=60)
GFR, Est African American: 99 mL/min/{1.73_m2} (ref >=60)
GLOBULIN: 2 g/dL (ref 1.9–3.7)
GLUCOSE: 104 mg/dL — AB (ref 65–99)
Potassium: 4.9 mmol/L (ref 3.5–5.3)
Sodium: 140 mmol/L (ref 135–146)
Total Bilirubin: 1 mg/dL (ref 0.2–1.2)
Total Protein: 6.3 g/dL (ref 6.1–8.1)

## 2018-02-10 LAB — LIPID PANEL
CHOLESTEROL: 157 mg/dL (ref <200)
HDL: 39 mg/dL — AB (ref >40)
LDL Cholesterol (Calc): 81 mg/dL (calc)
NON-HDL CHOLESTEROL (CALC): 118 mg/dL (ref <130)
Total CHOL/HDL Ratio: 4 (calc) (ref <5.0)
Triglycerides: 278 mg/dL — ABNORMAL HIGH (ref <150)

## 2018-02-10 LAB — TSH: TSH: 1.85 mIU/L (ref 0.40–4.50)

## 2018-02-10 NOTE — Progress Notes (Signed)
Patient ID: Randy Thomas MRN: [id number], DOB: 07-02-1936 82 y.o. Date of Encounter: 02/10/2018, 1:43 PM    Chief Complaint: Physical (CPE)  HPI: 82 y.o. y/o white male here for CPE.    09/2015: He is also being seen as a new patient to establish care.  He is wearing a Veteran's cap---asked if he goes to the [place].  He says that he has been going to the [place] in [place] but is in the process of trying to find out if he can go to the new [place] in [place]. However, he only goes to the [place] for very limited things----such as, he does get his eye glasses there-- because he can get them for free there.  Says that he goes to his "Civilian Doctor for most everything."  However says that his "civilian doctor" is "getting old,  close to 56" so needs a new provider. (sees Dr. Laurian Brim in [place]).  He has a history of CAD. Says he has stent placed by Dr. Einar Gip 2006. Say he was continuing to have follow-up with Dr. Einar Gip annually--- but this past December, patient called and canceled the appointment.  He states that he sees no other specialists.  Reviewed his past medical history and problem list and all of this is up to date and documented below. Per patient--There is no additional past medical history or surgery history. Also reviewed his family medical history but there is no significant family medical history.  I asked him what his daily routine/activity level is. He says that he does all of the vacuuming and laundry and the lawn work. Says his wife likes flowers so he does flowers. Says that he does no "routine exercise program but sometimes he and his wife will go and walk at [facility].  He says that even with this exertion, he has no chest pressure heaviness or tightness in no increased shortness of breath----no symptoms similar to his prior angina.  He says that "the kind of high cholesterol he has has a really long name--- that it is hereditary something or another----and  that it is very high and they have told him if they can just get it down to around 200 they're happy with that"  No complaints or concerns today. Very pleasant man. Very talkative. Even comments that he knows he's a real chatter and talker and his wife tells him that in that he gets that from his mom. !!!   10/15/2016:  Says that he has been having no chest pressure, no chest heaviness, no chest tightness. No increased SOB or DOE.  Still stays active---vacuuming, working outside in the yard.  Ran out of Crestor ~  Month ago. Was having no myalgias or other adverse effects---just ran out of refills. Taking ramipril and toprol daily. No lightheadedness or other adv effects. Says he saw Dr. Einar Gip for OV Sept 2017. Says he had him do carotid dopplers. Had no other cardiac tests this year.  Dr. Einar Gip does labs when he sees pt. Therefore, I will continue to just see patient for routine visit once a year and perform labs here once a year. Dr. Einar Gip will cont to monitor labs during the interval between. Reports that he occasionally gets some low back pain. Says that in the past he was prescribed Robaxin for this and it has worked well and is requesting refill on this. No other concerns to discuss today.   02/10/2018: Today he reports that he has been doing well.  Has  no specific concerns to address.  Continues to be very active and does vacuuming laundry and gardening.  Has no angina symptoms even with this exertion.    Review of Systems: Consitutional: No fever, chills, fatigue, night sweats, lymphadenopathy, or weight changes. Eyes: No visual changes, eye redness, or discharge. ENT/Mouth: Ears: No otalgia, tinnitus, hearing loss, discharge. Nose: No congestion, rhinorrhea, sinus pain, or epistaxis. Throat: No sore throat, post nasal drip, or teeth pain. Cardiovascular: No CP, palpitations, diaphoresis, DOE, edema, orthopnea, PND. Respiratory: No cough, hemoptysis, SOB, or  wheezing. Gastrointestinal: No anorexia, dysphagia, reflux, pain, nausea, vomiting, hematemesis, diarrhea, constipation, BRBPR, or melena. Genitourinary: No dysuria, frequency, urgency, hematuria, incontinence, nocturia, decreased urinary stream, discharge, impotence, or testicular pain/masses. Musculoskeletal: No decreased ROM, myalgias, stiffness, joint swelling, or weakness. Skin: No rash, erythema, lesion changes, pain, warmth, jaundice, or pruritis. Neurological: No headache, dizziness, syncope, seizures, tremors, memory loss, coordination problems, or paresthesias. Psychological: No anxiety, depression, hallucinations, SI/HI. Endocrine: No fatigue, polydipsia, polyphagia, polyuria, or known diabetes. All other systems were reviewed and are otherwise negative.  Past Medical History:  Diagnosis Date  . Allergy   . Hyperlipidemia   . Hypertension   . Myocardial infarction [facility])      Past Surgical History:  Procedure Laterality Date  . FRACTURE SURGERY     1967 wore cast    Home Meds:  Outpatient Medications Prior to Visit  Medication Sig Dispense Refill  . acetaminophen (TYLENOL ARTHRITIS PAIN) 650 MG CR tablet Take 2 tablets (1,300 mg total) by mouth every 8 (eight) hours as needed. For pain 120 tablet 2  . aspirin EC 81 MG tablet Take 1 tablet (81 mg total) by mouth daily. 30 tablet 3  . cholecalciferol (VITAMIN D) 1000 units tablet Take 1 tablet (1,000 Units total) by mouth daily. 30 tablet 3  . lansoprazole (PREVACID) 30 MG capsule Take 1 capsule (30 mg total) by mouth 2 (two) times daily. 60 capsule 3  . methocarbamol (ROBAXIN) 750 MG tablet Take 1 tablet (750 mg total) by mouth 4 (four) times daily. 60 tablet 5  . metoprolol succinate (TOPROL-XL) 50 MG 24 hr tablet TAKE 1/2 TABLET BY MOUTH IN THE MORNING AND 1/2 TABLET BY MOUTH IN THE EVENING 60 tablet 4  . Multiple Vitamin (MULTIVITAMIN WITH MINERALS) TABS tablet Take 1 tablet by mouth daily. 30 tablet 3  . omega-3 acid  ethyl esters (LOVAZA) 1 g capsule Take 2 capsules (2 g total) by mouth 2 (two) times daily. 120 capsule 3  . ramipril (ALTACE) 2.5 MG capsule Take 1 capsule (2.5 mg total) by mouth daily. 90 capsule 1  . rosuvastatin (CRESTOR) 40 MG tablet Take 1 tablet (40 mg total) by mouth at bedtime. 90 tablet 0  . rosuvastatin (CRESTOR) 40 MG tablet take 1 tablet by mouth at bedtime 90 tablet 0   No facility-administered medications prior to visit.     Allergies: No Known Allergies  Social History   Socioeconomic History  . Marital status: Married    Spouse name: Not on file  . Number of children: Not on file  . Years of education: Not on file  . Highest education level: Not on file  Occupational History  . Not on file  Social Needs  . Financial resource strain: Not on file  . Food insecurity:    Worry: Not on file    Inability: Not on file  . Transportation needs:    Medical: Not on file    Non-medical: Not on  file  Tobacco Use  . Smoking status: Former Smoker    Last attempt to quit: 09/15/1981    Years since quitting: 36.4  . Smokeless tobacco: Never Used  Substance and Sexual Activity  . Alcohol use: No    Alcohol/week: 0.0 oz  . Drug use: No  . Sexual activity: Not Currently  Lifestyle  . Physical activity:    Days per week: Not on file    Minutes per session: Not on file  . Stress: Not on file  Relationships  . Social connections:    Talks on phone: Not on file    Gets together: Not on file    Attends religious service: Not on file    Active member of club or organization: Not on file    Attends meetings of clubs or organizations: Not on file    Relationship status: Not on file  . Intimate partner violence:    Fear of current or ex partner: Not on file    Emotionally abused: Not on file    Physically abused: Not on file    Forced sexual activity: Not on file  Other Topics Concern  . Not on file  Social History Narrative  . Not on file    Family History  Problem  Relation Age of Onset  . Asthma Mother   . Hearing loss Mother   . Heart disease Father   . Diabetes Brother   . Diabetes Maternal Grandmother     Physical Exam: Blood pressure 120/60, pulse 60, temperature 97.7 F (36.5 C), temperature source Oral, resp. rate 14, height 5\' 6"  (1.676 m), weight 77.6 kg (171 lb), SpO2 96 %., Body mass index is 27.6 kg/m. General: WNWD WM. Appears in no acute distress. Head: Normocephalic, atraumatic, eyes without discharge, sclera non-icteric, nares are without discharge. Bilateral auditory canals clear, TM's are without perforation, pearly grey and translucent with reflective cone of light bilaterally. Oral cavity moist, posterior pharynx without exudate, erythema.  Neck: Supple. No thyromegaly. No lymphadenopathy. Lungs: Clear bilaterally to auscultation without wheezes, rales, or rhonchi. Breathing is unlabored. Heart: RRR with S1 S2. No murmurs, rubs, or gallops. Abdomen: Soft, non-tender, non-distended with normoactive bowel sounds. No hepatomegaly. No rebound/guarding. Umbilical Hernia Present. Musculoskeletal:  Strength and tone normal for age. Extremities/Skin: Warm and dry. No clubbing or cyanosis. No edema. No rashes or suspicious lesions. Neuro: Alert and oriented X 3. Moves all extremities spontaneously. Gait is normal. CNII-XII grossly in tact. Psych:  Responds to questions appropriately with a normal affect.   Assessment/Plan:  82 y.o. y/o  white male here for CPE  -1. Visit for preventive health examination  A. Screening Labs: 02/10/2018: He is fasting.  Check labs now. - CBC with Differential/Platelet - COMPLETE METABOLIC PANEL WITH GFR - Lipid panel - TSH - VITAMIN D 25 Hydroxy (Vit-D Deficiency, Fractures)    B. Screening For Prostate Cancer: - Age 84. Will stop checking PSAs 02/10/2018: No further PSA indicated  C. Screening For Colorectal Cancer:  He states that he did have a colonoscopy around 5-10 years ago. Says this was  performed through the [facility] [place]. He states that he keeps medical records at home and will look through his records and will bring in colonoscopy report. 02/10/2018: Reviewed that he never brought in colonoscopy report.  However he does continue to go to the [facility] [place] routinely and will follow this up there.  D. Immunizations: He reports that he has immunization records at home and will bring them in for  [place] to put in our records. Says that he knows he had a flu vaccine at the pharmacy October 2016. Says that he has had pneumonia vaccine at the [facility] and even had "the booster". Says that he had to T dap about one year ago. Isn't sure if he had shingles vaccine. Flu---He came here and received Flu vaccine 2017--2018 Tetanus Pneumococcal Zostaax 02/10/2018: Reviewed that he never brought in immunization records.  However he does continue to go to the [facility] [place] routinely and will follow up at the [facility] [place] to make sure immunizations are updated.  2. Coronary artery disease involving native coronary artery of native heart without angina pectoris 02/10/2018:He is currently stable with no angina even with exertion.                  He has had follow-up with his Cardiologist--Dr. Einar Gip                  He is on:                  Aspirin, beta blocker, ACE inhibitor, statin  3. Hyperlipidemia 02/10/2018: He is on Crestor. He is fasting. Will check labs to monitor. - COMPLETE METABOLIC PANEL WITH GFR - Lipid panel Dr. Einar Gip does labs when he sees pt. Therefore, I will continue to just see patient for routine visit once a year and perform labs here once a year. Dr. Einar Gip will cont to monitor labs during the interval between.  4. Essential hypertension 02/10/2018:Blood pressure is at goal/controlled. Check lab to monitor. Continue current medications. - COMPLETE METABOLIC PANEL WITH GFR  5. Vitamin D deficiency 02/10/2018:He is on vitamin D supplements. Will check lab now to monitor. - VITAMIN D 25 Hydroxy (Vit-D Deficiency,  Fractures)   Dr. Einar Gip does labs when he sees pt. Therefore, I will continue to just see patient for routine visit once a year and perform labs here once a year. Dr. Einar Gip will cont to monitor labs during the interval between.   Subjective:   Patient presents for Medicare Annual/Subsequent preventive examination.   Review Past Medical/Family/Social: All of this information is reviewed and documented in the Epic tabs as well as in the note above.   Risk Factors  Current exercise habits:  This is documented in the history of present illness above. Dietary issues discussed:  He follows a low-sodium low-cholesterol "heart healthy "diet  Cardiac risk factors:  He has a known history of CAD.  Depression Screen  (Note: if answer to either of the following is "Yes", a more complete depression screening is indicated)  Over the past two weeks, have you felt down, depressed or hopeless? No Over the past two weeks, have you felt little interest or pleasure in doing things? No Have you lost interest or pleasure in daily life? No Do you often feel hopeless? No Do you cry easily over simple problems? No   Activities of Daily Living  In your present state of health, do you have any difficulty performing the following activities?:  Driving? No  Managing money? No  Feeding yourself? No  Getting from bed to chair? No  Climbing a flight of stairs? No  Preparing food and eating?: No  Bathing or showering? No  Getting dressed: No  Getting to the toilet? No  Using the toilet:No  Moving around from place to place: No  In the past year have you fallen or had a near fall?:No  Are you sexually active? No  Do  you have more than one partner? No   Hearing Difficulties: No  Do you often ask people to speak up or repeat themselves? No  Do you experience ringing or noises in your ears? No Do you have difficulty understanding soft or whispered voices? No  Do you feel that you have a problem with memory?  No Do you often misplace items? No  Do you feel safe at home? Yes  Cognitive Testing  Alert? Yes Normal Appearance?Yes  Oriented to person? Yes Place? Yes  Time? Yes  Recall of three objects? Yes  Can perform simple calculations? Yes  Displays appropriate judgment?Yes  Can read the correct time from a watch face?Yes   List the Names of Other Physician/Practitioners you currently use:  Dr. Einar Gip, Cardiology [facility] any recent Medical Services you may have received from other than [facility] providers in the past year (date may be approximate).  Dr. Einar Gip, Cardiology [facility]  Screening Tests / Date----This information is all documented above. See note above. Colonoscopy                     Zostavax  Influenza Vaccine  Tetanus/tdap    Assessment:    Annual wellness medicare exam   Plan:    During the course of the visit the patient was educated and counseled about appropriate screening and preventive services.   Medicare Attestation  I have personally reviewed:  The patient's medical and social history  Their use of alcohol, tobacco or illicit drugs  Their current medications and supplements  The patient's functional ability including ADLs,fall risks, home safety risks, cognitive, and hearing and visual impairment  Diet and physical activities     Signed:   [address] [place], [place]  02/10/2018 1:43 PM

## 2018-03-22 DIAGNOSIS — K429 Umbilical hernia without obstruction or gangrene: Secondary | ICD-10-CM | POA: Diagnosis not present

## 2018-03-31 DIAGNOSIS — I2510 Atherosclerotic heart disease of native coronary artery without angina pectoris: Secondary | ICD-10-CM | POA: Diagnosis not present

## 2018-03-31 DIAGNOSIS — I10 Essential (primary) hypertension: Secondary | ICD-10-CM | POA: Diagnosis not present

## 2018-03-31 DIAGNOSIS — E782 Mixed hyperlipidemia: Secondary | ICD-10-CM | POA: Diagnosis not present

## 2018-03-31 DIAGNOSIS — R0989 Other specified symptoms and signs involving the circulatory and respiratory systems: Secondary | ICD-10-CM | POA: Diagnosis not present

## 2018-04-12 ENCOUNTER — Other Ambulatory Visit: Payer: Self-pay

## 2018-04-12 DIAGNOSIS — Z01810 Encounter for preprocedural cardiovascular examination: Secondary | ICD-10-CM | POA: Diagnosis not present

## 2018-04-12 DIAGNOSIS — I10 Essential (primary) hypertension: Secondary | ICD-10-CM | POA: Diagnosis not present

## 2018-04-12 DIAGNOSIS — I2510 Atherosclerotic heart disease of native coronary artery without angina pectoris: Secondary | ICD-10-CM | POA: Diagnosis not present

## 2018-04-12 MED ORDER — HYDROCODONE-ACETAMINOPHEN 5-325 MG PO TABS: 1.0000 | ORAL_TABLET | Freq: Four times a day (QID) | ORAL | 0 refills | Status: DC | PRN

## 2018-04-12 MED ORDER — METHOCARBAMOL 750 MG PO TABS: 750.0000 mg | ORAL_TABLET | Freq: Four times a day (QID) | ORAL | 5 refills | Status: DC

## 2018-04-12 NOTE — Telephone Encounter (Signed)
Last OV 02/10/2018 Last refill 11/19/2016 Ok to refill

## 2018-05-27 ENCOUNTER — Other Ambulatory Visit (HOSPITAL_COMMUNITY): Payer: Self-pay

## 2018-06-03 ENCOUNTER — Ambulatory Visit: Admit: 2018-06-03 | Payer: Self-pay | Admitting: General Surgery

## 2018-06-03 SURGERY — REPAIR, HERNIA, UMBILICAL, ADULT
Anesthesia: General

## 2019-02-24 DIAGNOSIS — D225 Melanocytic nevi of trunk: Secondary | ICD-10-CM | POA: Diagnosis not present

## 2019-02-24 DIAGNOSIS — S70262A Insect bite (nonvenomous), left hip, initial encounter: Secondary | ICD-10-CM | POA: Diagnosis not present

## 2019-02-24 DIAGNOSIS — Z1283 Encounter for screening for malignant neoplasm of skin: Secondary | ICD-10-CM | POA: Diagnosis not present

## 2019-03-31 ENCOUNTER — Ambulatory Visit: Payer: Self-pay | Admitting: Cardiology

## 2019-04-04 ENCOUNTER — Other Ambulatory Visit: Payer: Self-pay | Admitting: Family Medicine

## 2019-04-04 MED ORDER — HYDROCODONE-ACETAMINOPHEN 5-325 MG PO TABS: 1.0000 | ORAL_TABLET | Freq: Four times a day (QID) | ORAL | 0 refills | Status: DC | PRN

## 2019-04-04 NOTE — Telephone Encounter (Signed)
Pt needs refill on hydrocodone to [facility] [address]

## 2019-04-04 NOTE — Telephone Encounter (Signed)
Patient is requesting a refill on Hydrocodone   LOV: 02/10/18  LRF:   04/12/18   (MBD pt)

## 2019-04-13 ENCOUNTER — Telehealth: Payer: Self-pay | Admitting: Family Medicine

## 2019-04-13 MED ORDER — METHOCARBAMOL 750 MG PO TABS: 750.0000 mg | ORAL_TABLET | Freq: Four times a day (QID) | ORAL | 0 refills | Status: DC

## 2019-04-13 NOTE — Telephone Encounter (Signed)
Hydrocodone was refilled on 04/04/19 by Dr. Dennard Schaumann  - sent in for refill on the Robaxin for #30 and pt NTBS before further refills. Will also send letter.

## 2019-04-13 NOTE — Telephone Encounter (Signed)
Refill on hydrocodone and methocarbamol to [facility] [address]

## 2019-04-15 ENCOUNTER — Other Ambulatory Visit: Payer: Self-pay

## 2019-04-15 ENCOUNTER — Other Ambulatory Visit: Payer: Self-pay | Admitting: Cardiology

## 2019-04-15 MED ORDER — OMEGA-3-ACID ETHYL ESTERS 1 G PO CAPS: 2.0000 g | ORAL_CAPSULE | Freq: Two times a day (BID) | ORAL | 0 refills | Status: DC

## 2019-04-21 ENCOUNTER — Other Ambulatory Visit: Payer: Self-pay | Admitting: *Deleted

## 2019-04-21 MED ORDER — METHOCARBAMOL 750 MG PO TABS: 750.0000 mg | ORAL_TABLET | Freq: Four times a day (QID) | ORAL | 0 refills | Status: DC

## 2019-05-02 ENCOUNTER — Other Ambulatory Visit: Payer: Self-pay

## 2019-05-03 ENCOUNTER — Ambulatory Visit (INDEPENDENT_AMBULATORY_CARE_PROVIDER_SITE_OTHER): Payer: Medicare HMO | Admitting: Family Medicine

## 2019-05-03 ENCOUNTER — Encounter: Payer: Self-pay | Admitting: Family Medicine

## 2019-05-03 ENCOUNTER — Other Ambulatory Visit: Payer: Self-pay

## 2019-05-03 VITALS — BP 120/60 | HR 60 | Temp 98.0°F | Resp 20 | Ht 66.0 in | Wt 166.0 lb

## 2019-05-03 DIAGNOSIS — Z23 Encounter for immunization: Secondary | ICD-10-CM | POA: Diagnosis not present

## 2019-05-03 DIAGNOSIS — E785 Hyperlipidemia, unspecified: Secondary | ICD-10-CM | POA: Diagnosis not present

## 2019-05-03 DIAGNOSIS — I10 Essential (primary) hypertension: Secondary | ICD-10-CM | POA: Diagnosis not present

## 2019-05-03 DIAGNOSIS — I2510 Atherosclerotic heart disease of native coronary artery without angina pectoris: Secondary | ICD-10-CM

## 2019-05-03 MED ORDER — CETIRIZINE HCL 10 MG PO TABS: 10.0000 mg | ORAL_TABLET | Freq: Every day | ORAL | 11 refills | Status: DC

## 2019-05-03 MED ORDER — METHOCARBAMOL 750 MG PO TABS: 750.0000 mg | ORAL_TABLET | Freq: Four times a day (QID) | ORAL | 2 refills | Status: DC

## 2019-05-03 MED ORDER — HYDROCODONE-ACETAMINOPHEN 5-325 MG PO TABS: 1.0000 | ORAL_TABLET | Freq: Four times a day (QID) | ORAL | 0 refills | Status: DC | PRN

## 2019-05-03 NOTE — Addendum Note (Signed)
Addended by: Shary Decamp B on: 05/03/2019 12:24 PM   Modules accepted: Orders

## 2019-05-03 NOTE — Progress Notes (Signed)
Subjective:    Patient ID: Randy Thomas, male    DOB: 10-21-1935, 83 y.o.   MRN: [id number]  HPI Patient establish care with my partner last year who recently retired.  Patient has a significant past medical history for coronary artery 2 stents placed in 2006 by Dr. Einar Gip.  He is currently on aspirin 81 mg a day along with Crestor 40 mg a day for his coronary artery disease.  He denies any chest pain.  He denies any shortness of breath.  He denies any dyspnea on exertion.  He denies any myalgias or right upper quadrant pain.  He does have chronic low back pain.  He states that he "has no cartilage between 3 vertebrae in his back".  I suspect that the patient is referring to degenerative disc disease in the lumbar spine.  He denies any radicular symptoms, numbness, tingling, or weakness radiating down his legs but he does have near constant pain in his lower back.  When he gets severe he will take 1 hydrocodone.  15 tablets last him at least a month.  He also uses Robaxin occasionally for back pain.  His blood pressure today is well controlled.  He denies any angioedema on his ACE inhibitor.  He denies any cough on his ACE inhibitor.  He denies any syncope or lightheadedness on his beta-blocker.  He is compliant with his aspirin.  He had Pneumovax 23 6 years ago.  He has never had Prevnar 13 to his knowledge.  This is due today Past Medical History:  Diagnosis Date  . Allergy   . Hyperlipidemia   . Hypertension   . Myocardial infarction [facility])    Past Surgical History:  Procedure Laterality Date  . [place] wore cast   Current Outpatient Medications on File Prior to Visit  Medication Sig Dispense Refill  . acetaminophen (TYLENOL ARTHRITIS PAIN) 650 MG CR tablet Take 2 tablets (1,300 mg total) by mouth every 8 (eight) hours as needed. For pain 120 tablet 2  . aspirin EC 81 MG tablet Take 1 tablet (81 mg total) by mouth daily. 30 tablet 3  . cholecalciferol (VITAMIN D) 1000  units tablet Take 1 tablet (1,000 Units total) by mouth daily. 30 tablet 3  . HYDROcodone-acetaminophen (NORCO/VICODIN) 5-325 MG tablet Take 1 tablet by mouth every 6 (six) hours as needed. 15 tablet 0  . lansoprazole (PREVACID) 30 MG capsule Take 1 capsule (30 mg total) by mouth 2 (two) times daily. 60 capsule 3  . methocarbamol (ROBAXIN) 750 MG tablet Take 1 tablet (750 mg total) by mouth 4 (four) times daily. 60 tablet 0  . metoprolol succinate (TOPROL-XL) 50 MG 24 hr tablet TAKE 1/2 TABLET BY MOUTH IN THE MORNING AND 1/2 TABLET BY MOUTH IN THE EVENING 60 tablet 4  . Multiple Vitamin (MULTIVITAMIN WITH MINERALS) TABS tablet Take 1 tablet by mouth daily. 30 tablet 3  . omega-3 acid ethyl esters (LOVAZA) 1 g capsule TAKE 2 CAPSULES BY MOUTH TWICE DAILY 360 capsule 0  . ramipril (ALTACE) 2.5 MG capsule Take 1 capsule (2.5 mg total) by mouth daily. 90 capsule 1  . rosuvastatin (CRESTOR) 40 MG tablet Take 1 tablet (40 mg total) by mouth at bedtime. 90 tablet 0   No current facility-administered medications on file prior to visit.    No Known Allergies Social History   Socioeconomic History  . Marital status: Married    Spouse name: Not on file  .  Number of children: Not on file  . Years of education: Not on file  . Highest education level: Not on file  Occupational History  . Not on file  Social Needs  . Financial resource strain: Not on file  . Food insecurity    Worry: Not on file    Inability: Not on file  . Transportation needs    Medical: Not on file    Non-medical: Not on file  Tobacco Use  . Smoking status: Former Smoker    Quit date: 09/15/1981    Years since quitting: 37.6  . Smokeless tobacco: Never Used  Substance and Sexual Activity  . Alcohol use: No    Alcohol/week: 0.0 standard drinks  . Drug use: No  . Sexual activity: Not Currently  Lifestyle  . Physical activity    Days per week: Not on file    Minutes per session: Not on file  . Stress: Not on file   Relationships  . Social Herbalist on phone: Not on file    Gets together: Not on file    Attends religious service: Not on file    Active member of club or organization: Not on file    Attends meetings of clubs or organizations: Not on file    Relationship status: Not on file  . Intimate partner violence    Fear of current or ex partner: Not on file    Emotionally abused: Not on file    Physically abused: Not on file    Forced sexual activity: Not on file  Other Topics Concern  . Not on file  Social History Narrative  . Not on file      Review of Systems  All other systems reviewed and are negative.      Objective:   Physical Exam Vitals signs reviewed.  Constitutional:      Appearance: Normal appearance. He is normal weight.  Cardiovascular:     Rate and Rhythm: Normal rate and regular rhythm.     Pulses: Normal pulses.     Heart sounds: Normal heart sounds. No murmur. No friction rub. No gallop.   Pulmonary:     Effort: Pulmonary effort is normal. No respiratory distress.     Breath sounds: Normal breath sounds. No stridor. No wheezing, rhonchi or rales.  Chest:     Chest wall: No tenderness.  Abdominal:     General: Abdomen is flat. Bowel sounds are normal. There is no distension.     Palpations: Abdomen is soft.     Tenderness: There is no abdominal tenderness. There is no guarding.  Musculoskeletal:     Right lower leg: No edema.     Left lower leg: No edema.  Neurological:     Mental Status: He is alert.           Assessment & Plan:  The primary encounter diagnosis was Coronary artery disease involving native coronary artery of native heart without angina pectoris. Diagnoses of Essential hypertension and Hyperlipidemia, unspecified hyperlipidemia type were also pertinent to this visit. Given his history of coronary artery disease, I would like him to return fasting so that we can check a CMP and a fasting lipid panel.  I would like his LDL  cholesterol to be below 70.  I encouraged the patient to continue to take his aspirin along with his beta-blocker and his ACE inhibitor.  His blood pressure today is well controlled at 120/60.  He denies any concerning symptoms  or features.  His physical exam today is normal.  I did recommend that he receive Prevnar 13.  Also recommended a seasonal flu shot when available.  I will refill the patient's hydrocodone which he uses sparingly for low back pain secondary to degenerative disc disease.  I also refilled his muscle relaxer.  Patient and his wife request a prescription for Zyrtec for allergies.  I gave the patient Zyrtec but explained if he gets any dry mouth, dizziness, or sedation due to the anticholinergic effects, we may need to switch to Flonase given his age.  His family will monitor for the symptoms.

## 2019-05-05 ENCOUNTER — Other Ambulatory Visit: Payer: Self-pay

## 2019-05-05 ENCOUNTER — Other Ambulatory Visit: Payer: Medicare HMO

## 2019-05-05 DIAGNOSIS — E785 Hyperlipidemia, unspecified: Secondary | ICD-10-CM | POA: Diagnosis not present

## 2019-05-05 DIAGNOSIS — I2510 Atherosclerotic heart disease of native coronary artery without angina pectoris: Secondary | ICD-10-CM | POA: Diagnosis not present

## 2019-05-05 DIAGNOSIS — I10 Essential (primary) hypertension: Secondary | ICD-10-CM | POA: Diagnosis not present

## 2019-05-06 ENCOUNTER — Other Ambulatory Visit: Payer: Medicare HMO

## 2019-05-06 ENCOUNTER — Encounter: Payer: Self-pay | Admitting: *Deleted

## 2019-05-06 LAB — CBC WITH DIFFERENTIAL/PLATELET
Absolute Monocytes: 597 cells/uL (ref 200–950)
Basophils Absolute: 71 cells/uL (ref 0–200)
Basophils Relative: 1.4 %
Eosinophils Absolute: 235 cells/uL (ref 15–500)
Eosinophils Relative: 4.6 %
HCT: 40.4 % (ref 38.5–50.0)
Hemoglobin: 13.8 g/dL (ref 13.2–17.1)
Lymphs Abs: 1739 cells/uL (ref 850–3900)
MCH: 29.5 pg (ref 27.0–33.0)
MCHC: 34.2 g/dL (ref 32.0–36.0)
MCV: 86.3 fL (ref 80.0–100.0)
MPV: 11.1 fL (ref 7.5–12.5)
Monocytes Relative: 11.7 %
Neutro Abs: 2458 cells/uL (ref 1500–7800)
Neutrophils Relative %: 48.2 %
Platelets: 160 10*3/uL (ref 140–400)
RBC: 4.68 10*6/uL (ref 4.20–5.80)
RDW: 13 % (ref 11.0–15.0)
Total Lymphocyte: 34.1 %
WBC: 5.1 10*3/uL (ref 3.8–10.8)

## 2019-05-06 LAB — LIPID PANEL
Cholesterol: 143 mg/dL (ref <200)
HDL: 31 mg/dL — ABNORMAL LOW (ref >=40)
LDL Cholesterol (Calc): 85 mg/dL (calc)
Non-HDL Cholesterol (Calc): 112 mg/dL (calc) (ref <130)
Total CHOL/HDL Ratio: 4.6 (calc) (ref <5.0)
Triglycerides: 168 mg/dL — ABNORMAL HIGH (ref <150)

## 2019-05-06 LAB — COMPLETE METABOLIC PANEL WITH GFR
AG Ratio: 2.5 (calc) (ref 1.0–2.5)
ALT: 17 U/L (ref 9–46)
AST: 20 U/L (ref 10–35)
Albumin: 4.2 g/dL (ref 3.6–5.1)
Alkaline phosphatase (APISO): 63 U/L (ref 35–144)
BUN: 13 mg/dL (ref 7–25)
CO2: 31 mmol/L (ref 20–32)
Calcium: 9.3 mg/dL (ref 8.6–10.3)
Chloride: 103 mmol/L (ref 98–110)
Creat: 0.79 mg/dL (ref 0.70–1.11)
GFR, Est African American: 98 mL/min/{1.73_m2} (ref >=60)
GFR, Est Non African American: 84 mL/min/{1.73_m2} (ref >=60)
Globulin: 1.7 g/dL (calc) — ABNORMAL LOW (ref 1.9–3.7)
Glucose, Bld: 103 mg/dL — ABNORMAL HIGH (ref 65–99)
Potassium: 4.6 mmol/L (ref 3.5–5.3)
Sodium: 141 mmol/L (ref 135–146)
Total Bilirubin: 1.2 mg/dL (ref 0.2–1.2)
Total Protein: 5.9 g/dL — ABNORMAL LOW (ref 6.1–8.1)

## 2019-06-14 ENCOUNTER — Other Ambulatory Visit: Payer: Self-pay | Admitting: Cardiology

## 2019-08-21 DIAGNOSIS — K219 Gastro-esophageal reflux disease without esophagitis: Secondary | ICD-10-CM | POA: Insufficient documentation

## 2019-08-21 DIAGNOSIS — E559 Vitamin D deficiency, unspecified: Secondary | ICD-10-CM | POA: Diagnosis not present

## 2019-08-21 DIAGNOSIS — I10 Essential (primary) hypertension: Secondary | ICD-10-CM | POA: Diagnosis not present

## 2019-08-21 DIAGNOSIS — Z5329 Procedure and treatment not carried out because of patient's decision for other reasons: Secondary | ICD-10-CM | POA: Diagnosis not present

## 2019-08-21 DIAGNOSIS — Z03818 Encounter for observation for suspected exposure to other biological agents ruled out: Secondary | ICD-10-CM | POA: Diagnosis not present

## 2019-08-21 DIAGNOSIS — I2510 Atherosclerotic heart disease of native coronary artery without angina pectoris: Secondary | ICD-10-CM | POA: Insufficient documentation

## 2019-08-21 DIAGNOSIS — Z20828 Contact with and (suspected) exposure to other viral communicable diseases: Secondary | ICD-10-CM | POA: Diagnosis not present

## 2019-08-21 DIAGNOSIS — R109 Unspecified abdominal pain: Secondary | ICD-10-CM | POA: Diagnosis not present

## 2019-08-21 DIAGNOSIS — K8010 Calculus of gallbladder with chronic cholecystitis without obstruction: Principal | ICD-10-CM | POA: Insufficient documentation

## 2019-08-21 DIAGNOSIS — E785 Hyperlipidemia, unspecified: Secondary | ICD-10-CM | POA: Insufficient documentation

## 2019-08-21 DIAGNOSIS — I252 Old myocardial infarction: Secondary | ICD-10-CM | POA: Diagnosis not present

## 2019-08-21 DIAGNOSIS — Z87891 Personal history of nicotine dependence: Secondary | ICD-10-CM | POA: Insufficient documentation

## 2019-08-21 DIAGNOSIS — K819 Cholecystitis, unspecified: Secondary | ICD-10-CM | POA: Diagnosis not present

## 2019-08-21 DIAGNOSIS — Z79899 Other long term (current) drug therapy: Secondary | ICD-10-CM | POA: Insufficient documentation

## 2019-08-21 DIAGNOSIS — Z7982 Long term (current) use of aspirin: Secondary | ICD-10-CM | POA: Insufficient documentation

## 2019-08-22 ENCOUNTER — Observation Stay (HOSPITAL_COMMUNITY)
Admission: EM | Admit: 2019-08-22 | Discharge: 2019-08-22 | Disposition: A | Discharge status: Home / Self Care | Payer: Medicare HMO | Attending: Internal Medicine | Admitting: Internal Medicine

## 2019-08-22 ENCOUNTER — Inpatient Hospital Stay (HOSPITAL_COMMUNITY): Payer: Medicare HMO | Admitting: Anesthesiology

## 2019-08-22 ENCOUNTER — Other Ambulatory Visit: Payer: Self-pay

## 2019-08-22 ENCOUNTER — Encounter (HOSPITAL_COMMUNITY)
Admission: EM | Disposition: A | Discharge status: Home / Self Care | Payer: Self-pay | Source: Home / Self Care | Attending: Internal Medicine

## 2019-08-22 ENCOUNTER — Encounter (HOSPITAL_COMMUNITY): Payer: Self-pay | Admitting: Emergency Medicine

## 2019-08-22 ENCOUNTER — Emergency Department (HOSPITAL_COMMUNITY): Payer: Medicare HMO

## 2019-08-22 ENCOUNTER — Inpatient Hospital Stay (HOSPITAL_COMMUNITY): Payer: Medicare HMO

## 2019-08-22 DIAGNOSIS — I10 Essential (primary) hypertension: Secondary | ICD-10-CM | POA: Diagnosis not present

## 2019-08-22 DIAGNOSIS — K219 Gastro-esophageal reflux disease without esophagitis: Secondary | ICD-10-CM | POA: Diagnosis not present

## 2019-08-22 DIAGNOSIS — R109 Unspecified abdominal pain: Secondary | ICD-10-CM | POA: Diagnosis not present

## 2019-08-22 DIAGNOSIS — I252 Old myocardial infarction: Secondary | ICD-10-CM | POA: Diagnosis not present

## 2019-08-22 DIAGNOSIS — J9811 Atelectasis: Secondary | ICD-10-CM | POA: Diagnosis not present

## 2019-08-22 DIAGNOSIS — E785 Hyperlipidemia, unspecified: Secondary | ICD-10-CM | POA: Diagnosis present

## 2019-08-22 DIAGNOSIS — K819 Cholecystitis, unspecified: Secondary | ICD-10-CM | POA: Diagnosis not present

## 2019-08-22 DIAGNOSIS — Z01818 Encounter for other preprocedural examination: Secondary | ICD-10-CM

## 2019-08-22 DIAGNOSIS — Z5329 Procedure and treatment not carried out because of patient's decision for other reasons: Secondary | ICD-10-CM | POA: Diagnosis not present

## 2019-08-22 DIAGNOSIS — K8010 Calculus of gallbladder with chronic cholecystitis without obstruction: Secondary | ICD-10-CM | POA: Diagnosis not present

## 2019-08-22 DIAGNOSIS — I2510 Atherosclerotic heart disease of native coronary artery without angina pectoris: Secondary | ICD-10-CM | POA: Diagnosis present

## 2019-08-22 DIAGNOSIS — Z20828 Contact with and (suspected) exposure to other viral communicable diseases: Secondary | ICD-10-CM | POA: Diagnosis not present

## 2019-08-22 DIAGNOSIS — E559 Vitamin D deficiency, unspecified: Secondary | ICD-10-CM | POA: Diagnosis not present

## 2019-08-22 DIAGNOSIS — I700 Atherosclerosis of aorta: Secondary | ICD-10-CM | POA: Diagnosis not present

## 2019-08-22 HISTORY — DX: Gastro-esophageal reflux disease without esophagitis: K21.9

## 2019-08-22 LAB — CBC WITH DIFFERENTIAL/PLATELET
Abs Immature Granulocytes: 0.02 10*3/uL (ref 0.00–0.07)
Basophils Absolute: 0.1 10*3/uL (ref 0.0–0.1)
Basophils Relative: 1 %
Eosinophils Absolute: 0.1 10*3/uL (ref 0.0–0.5)
Eosinophils Relative: 1 %
HCT: 38.7 % — ABNORMAL LOW (ref 39.0–52.0)
Hemoglobin: 12.8 g/dL — ABNORMAL LOW (ref 13.0–17.0)
Immature Granulocytes: 0 %
Lymphocytes Relative: 15 %
Lymphs Abs: 1.2 10*3/uL (ref 0.7–4.0)
MCH: 29.8 pg (ref 26.0–34.0)
MCHC: 33.1 g/dL (ref 30.0–36.0)
MCV: 90 fL (ref 80.0–100.0)
Monocytes Absolute: 0.7 10*3/uL (ref 0.1–1.0)
Monocytes Relative: 9 %
Neutro Abs: 5.9 10*3/uL (ref 1.7–7.7)
Neutrophils Relative %: 74 %
Platelets: 143 10*3/uL — ABNORMAL LOW (ref 150–400)
RBC: 4.3 MIL/uL (ref 4.22–5.81)
RDW: 12.9 % (ref 11.5–15.5)
WBC: 8 10*3/uL (ref 4.0–10.5)
nRBC: 0 % (ref 0.0–0.2)

## 2019-08-22 LAB — BASIC METABOLIC PANEL
Anion gap: 10 (ref 5–15)
BUN: 15 mg/dL (ref 8–23)
CO2: 26 mmol/L (ref 22–32)
Calcium: 8.7 mg/dL — ABNORMAL LOW (ref 8.9–10.3)
Chloride: 103 mmol/L (ref 98–111)
Creatinine, Ser: 0.69 mg/dL (ref 0.61–1.24)
GFR calc Af Amer: >60 mL/min (ref >60)
GFR calc non Af Amer: >60 mL/min (ref >60)
Glucose, Bld: 154 mg/dL — ABNORMAL HIGH (ref 70–99)
Potassium: 4.2 mmol/L (ref 3.5–5.1)
Sodium: 139 mmol/L (ref 135–145)

## 2019-08-22 LAB — URINALYSIS, ROUTINE W REFLEX MICROSCOPIC
Bilirubin Urine: NEGATIVE
Glucose, UA: NEGATIVE mg/dL
Hgb urine dipstick: NEGATIVE
Ketones, ur: NEGATIVE mg/dL
Leukocytes,Ua: NEGATIVE
Nitrite: NEGATIVE
Protein, ur: NEGATIVE mg/dL
Specific Gravity, Urine: >1.046 — ABNORMAL HIGH (ref 1.005–1.030)
pH: 6 (ref 5.0–8.0)

## 2019-08-22 LAB — COMPREHENSIVE METABOLIC PANEL
ALT: 22 U/L (ref 0–44)
AST: 23 U/L (ref 15–41)
Albumin: 4.1 g/dL (ref 3.5–5.0)
Alkaline Phosphatase: 69 U/L (ref 38–126)
Anion gap: 9 (ref 5–15)
BUN: 18 mg/dL (ref 8–23)
CO2: 26 mmol/L (ref 22–32)
Calcium: 8.6 mg/dL — ABNORMAL LOW (ref 8.9–10.3)
Chloride: 104 mmol/L (ref 98–111)
Creatinine, Ser: 0.75 mg/dL (ref 0.61–1.24)
GFR calc Af Amer: >60 mL/min (ref >60)
GFR calc non Af Amer: >60 mL/min (ref >60)
Glucose, Bld: 160 mg/dL — ABNORMAL HIGH (ref 70–99)
Potassium: 4 mmol/L (ref 3.5–5.1)
Sodium: 139 mmol/L (ref 135–145)
Total Bilirubin: 0.8 mg/dL (ref 0.3–1.2)
Total Protein: 6.7 g/dL (ref 6.5–8.1)

## 2019-08-22 LAB — CBC
HCT: 37.6 % — ABNORMAL LOW (ref 39.0–52.0)
Hemoglobin: 12.3 g/dL — ABNORMAL LOW (ref 13.0–17.0)
MCH: 29.4 pg (ref 26.0–34.0)
MCHC: 32.7 g/dL (ref 30.0–36.0)
MCV: 90 fL (ref 80.0–100.0)
Platelets: 130 10*3/uL — ABNORMAL LOW (ref 150–400)
RBC: 4.18 MIL/uL — ABNORMAL LOW (ref 4.22–5.81)
RDW: 13.1 % (ref 11.5–15.5)
WBC: 11.4 10*3/uL — ABNORMAL HIGH (ref 4.0–10.5)
nRBC: 0 % (ref 0.0–0.2)

## 2019-08-22 LAB — SARS CORONAVIRUS 2 BY RT PCR (HOSPITAL ORDER, PERFORMED IN ~~LOC~~ HOSPITAL LAB): SARS Coronavirus 2: NEGATIVE

## 2019-08-22 LAB — LIPASE, BLOOD: Lipase: 26 U/L (ref 11–51)

## 2019-08-22 SURGERY — CANCELLED PROCEDURE
Anesthesia: General

## 2019-08-22 MED ORDER — OMEGA-3-ACID ETHYL ESTERS 1 G PO CAPS
2.0000 | ORAL_CAPSULE | Freq: Two times a day (BID) | ORAL | Status: DC
  Filled 2019-08-22 (×5): qty 2

## 2019-08-22 MED ORDER — PROPOFOL 10 MG/ML IV BOLUS
INTRAVENOUS | Status: AC
  Filled 2019-08-22: qty 40

## 2019-08-22 MED ORDER — ONDANSETRON HCL 4 MG/2ML IJ SOLN: 4.0000 mg | Freq: Four times a day (QID) | INTRAMUSCULAR | Status: DC | PRN

## 2019-08-22 MED ORDER — ADULT MULTIVITAMIN W/MINERALS CH: 1.0000 | ORAL_TABLET | Freq: Every day | ORAL | Status: DC

## 2019-08-22 MED ORDER — PIPERACILLIN-TAZOBACTAM 3.375 G IVPB 30 MIN: 3.3750 g | Freq: Three times a day (TID) | INTRAVENOUS | Status: DC

## 2019-08-22 MED ORDER — HYDROMORPHONE HCL 1 MG/ML IJ SOLN: 0.2500 mg | INTRAMUSCULAR | Status: DC | PRN

## 2019-08-22 MED ORDER — FENTANYL CITRATE (PF) 250 MCG/5ML IJ SOLN
INTRAMUSCULAR | Status: AC
  Filled 2019-08-22: qty 5

## 2019-08-22 MED ORDER — CHLORHEXIDINE GLUCONATE CLOTH 2 % EX PADS: 6.0000 | MEDICATED_PAD | Freq: Once | CUTANEOUS | Status: DC

## 2019-08-22 MED ORDER — SODIUM CHLORIDE 0.9 % IV SOLN: 250.0000 mL | INTRAVENOUS | Status: DC | PRN

## 2019-08-22 MED ORDER — IOHEXOL 300 MG/ML  SOLN
100.0000 mL | Freq: Once | INTRAMUSCULAR | Status: AC | PRN
  Administered 2019-08-22: 01:00:00 100 mL via INTRAVENOUS

## 2019-08-22 MED ORDER — POLYETHYLENE GLYCOL 3350 17 G PO PACK: 17.0000 g | PACK | Freq: Every day | ORAL | Status: DC | PRN

## 2019-08-22 MED ORDER — ACETAMINOPHEN 325 MG PO TABS
650.0000 mg | ORAL_TABLET | Freq: Four times a day (QID) | ORAL | Status: DC | PRN
  Administered 2019-08-22: 07:00:00 650 mg via ORAL
  Filled 2019-08-22: qty 2

## 2019-08-22 MED ORDER — MORPHINE SULFATE (PF) 4 MG/ML IV SOLN
4.0000 mg | INTRAVENOUS | Status: DC | PRN
  Administered 2019-08-22: 04:00:00 4 mg via INTRAVENOUS
  Filled 2019-08-22: qty 1

## 2019-08-22 MED ORDER — LORATADINE 10 MG PO TABS: 10.0000 mg | ORAL_TABLET | Freq: Every day | ORAL | Status: DC

## 2019-08-22 MED ORDER — LACTATED RINGERS IV SOLN
INTRAVENOUS | Status: DC
  Administered 2019-08-22: 04:00:00 via INTRAVENOUS

## 2019-08-22 MED ORDER — RAMIPRIL 1.25 MG PO CAPS
2.5000 mg | ORAL_CAPSULE | Freq: Every day | ORAL | Status: DC
  Administered 2019-08-22: 11:00:00 2.5 mg via ORAL
  Filled 2019-08-22 (×3): qty 1

## 2019-08-22 MED ORDER — BUPIVACAINE LIPOSOME 1.3 % IJ SUSP
INTRAMUSCULAR | Status: AC
  Filled 2019-08-22: qty 20

## 2019-08-22 MED ORDER — FENTANYL CITRATE (PF) 100 MCG/2ML IJ SOLN
50.0000 ug | Freq: Once | INTRAMUSCULAR | Status: AC
  Administered 2019-08-22: 50 ug via INTRAVENOUS
  Filled 2019-08-22: qty 2

## 2019-08-22 MED ORDER — ACETAMINOPHEN 650 MG RE SUPP: 650.0000 mg | Freq: Four times a day (QID) | RECTAL | Status: DC | PRN

## 2019-08-22 MED ORDER — SODIUM CHLORIDE 0.9 % IR SOLN
Status: DC | PRN
  Administered 2019-08-22: 1000 mL

## 2019-08-22 MED ORDER — FENTANYL CITRATE (PF) 100 MCG/2ML IJ SOLN
50.0000 ug | Freq: Once | INTRAMUSCULAR | Status: AC
  Administered 2019-08-22: 02:00:00 50 ug via INTRAVENOUS
  Filled 2019-08-22: qty 2

## 2019-08-22 MED ORDER — MORPHINE SULFATE (PF) 2 MG/ML IV SOLN
1.0000 mg | INTRAVENOUS | Status: DC | PRN
  Administered 2019-08-22: 1 mg via INTRAVENOUS
  Filled 2019-08-22: qty 1

## 2019-08-22 MED ORDER — LACTATED RINGERS IV SOLN: Freq: Once | INTRAVENOUS | Status: DC

## 2019-08-22 MED ORDER — ENOXAPARIN SODIUM 40 MG/0.4ML ~~LOC~~ SOLN: 40.0000 mg | SUBCUTANEOUS | Status: DC

## 2019-08-22 MED ORDER — VITAMIN D3 25 MCG (1000 UNIT) PO TABS
1000.0000 [IU] | ORAL_TABLET | Freq: Every day | ORAL | Status: DC
  Filled 2019-08-22 (×3): qty 1

## 2019-08-22 MED ORDER — METOPROLOL SUCCINATE ER 25 MG PO TB24
12.5000 mg | ORAL_TABLET | Freq: Every day | ORAL | Status: DC
  Administered 2019-08-22: 11:00:00 12.5 mg via ORAL
  Filled 2019-08-22: qty 1

## 2019-08-22 MED ORDER — ONDANSETRON HCL 4 MG PO TABS: 4.0000 mg | ORAL_TABLET | Freq: Four times a day (QID) | ORAL | Status: DC | PRN

## 2019-08-22 MED ORDER — SODIUM CHLORIDE 0.9% FLUSH: 3.0000 mL | INTRAVENOUS | Status: DC | PRN

## 2019-08-22 MED ORDER — MORPHINE SULFATE (PF) 2 MG/ML IV SOLN: 1.0000 mg | INTRAVENOUS | Status: DC | PRN

## 2019-08-22 MED ORDER — SODIUM CHLORIDE 0.9% FLUSH: 3.0000 mL | Freq: Two times a day (BID) | INTRAVENOUS | Status: DC

## 2019-08-22 MED ORDER — BISACODYL 10 MG RE SUPP: 10.0000 mg | Freq: Every day | RECTAL | Status: DC | PRN

## 2019-08-22 MED ORDER — ROSUVASTATIN CALCIUM 40 MG PO TABS
40.0000 mg | ORAL_TABLET | Freq: Every day | ORAL | Status: DC
  Filled 2019-08-22 (×2): qty 1

## 2019-08-22 MED ORDER — LIDOCAINE 2% (20 MG/ML) 5 ML SYRINGE
INTRAMUSCULAR | Status: AC
  Filled 2019-08-22: qty 5

## 2019-08-22 MED ORDER — SODIUM CHLORIDE 0.9 % IV SOLN
INTRAVENOUS | Status: DC
  Administered 2019-08-22: 01:00:00 via INTRAVENOUS

## 2019-08-22 MED ORDER — PANTOPRAZOLE SODIUM 40 MG IV SOLR
40.0000 mg | Freq: Once | INTRAVENOUS | Status: AC
  Administered 2019-08-22: 01:00:00 40 mg via INTRAVENOUS
  Filled 2019-08-22: qty 40

## 2019-08-22 MED ORDER — SODIUM CHLORIDE 0.9 % IV SOLN
2.0000 g | Freq: Once | INTRAVENOUS | Status: AC
  Administered 2019-08-22: 2 g via INTRAVENOUS
  Filled 2019-08-22: qty 20

## 2019-08-22 MED ORDER — ROCURONIUM BROMIDE 10 MG/ML (PF) SYRINGE
PREFILLED_SYRINGE | INTRAVENOUS | Status: AC
  Filled 2019-08-22: qty 10

## 2019-08-22 MED ORDER — GLYCOPYRROLATE PF 0.2 MG/ML IJ SOSY
PREFILLED_SYRINGE | INTRAMUSCULAR | Status: AC
  Filled 2019-08-22: qty 1

## 2019-08-22 SURGICAL SUPPLY — 43 items
ADH SKN CLS APL DERMABOND .7 (GAUZE/BANDAGES/DRESSINGS) ×1
APL PRP STRL LF DISP 70% ISPRP (MISCELLANEOUS) ×1
APPLIER CLIP ROT 10 11.4 M/L (STAPLE) ×2
APR CLP MED LRG 11.4X10 (STAPLE) ×1
BAG RETRIEVAL 10 (BASKET) ×1
CHLORAPREP W/TINT 26 (MISCELLANEOUS) ×3 IMPLANT
CLIP APPLIE ROT 10 11.4 M/L (STAPLE) ×2 IMPLANT
CLOTH BEACON ORANGE TIMEOUT ST (SAFETY) ×3 IMPLANT
COVER LIGHT HANDLE STERIS (MISCELLANEOUS) ×6 IMPLANT
COVER WAND RF STERILE (DRAPES) ×3 IMPLANT
DERMABOND ADVANCED (GAUZE/BANDAGES/DRESSINGS) ×1
DERMABOND ADVANCED .7 DNX12 (GAUZE/BANDAGES/DRESSINGS) ×2 IMPLANT
ELECT REM PT RETURN 9FT ADLT (ELECTROSURGICAL) ×2
ELECTRODE REM PT RTRN 9FT ADLT (ELECTROSURGICAL) ×2 IMPLANT
GLOVE BIOGEL PI IND STRL 7.0 (GLOVE) ×4 IMPLANT
GLOVE BIOGEL PI INDICATOR 7.0 (GLOVE) ×2
GLOVE SURG SS PI 7.5 STRL IVOR (GLOVE) ×3 IMPLANT
GOWN STRL REUS W/TWL LRG LVL3 (GOWN DISPOSABLE) ×9 IMPLANT
HEMOSTAT SNOW SURGICEL 2X4 (HEMOSTASIS) ×3 IMPLANT
INST SET LAPROSCOPIC AP (KITS) ×3 IMPLANT
KIT TURNOVER KIT A (KITS) ×3 IMPLANT
MANIFOLD NEPTUNE II (INSTRUMENTS) ×3 IMPLANT
NDL HYPO 18GX1.5 BLUNT FILL (NEEDLE) ×1 IMPLANT
NDL INSUFFLATION 14GA 120MM (NEEDLE) ×1 IMPLANT
NEEDLE HYPO 18GX1.5 BLUNT FILL (NEEDLE) ×2 IMPLANT
NEEDLE HYPO 22GX1.5 SAFETY (NEEDLE) ×3 IMPLANT
NEEDLE INSUFFLATION 14GA 120MM (NEEDLE) ×2 IMPLANT
NS IRRIG 1000ML POUR BTL (IV SOLUTION) ×3 IMPLANT
PACK LAP CHOLE LZT030E (CUSTOM PROCEDURE TRAY) ×3 IMPLANT
PAD ARMBOARD 7.5X6 YLW CONV (MISCELLANEOUS) ×3 IMPLANT
SET BASIN LINEN APH (SET/KITS/TRAYS/PACK) ×3 IMPLANT
SET TUBE SMOKE EVAC HIGH FLOW (TUBING) ×3 IMPLANT
SLEEVE ENDOPATH XCEL 5M (ENDOMECHANICALS) ×3 IMPLANT
SUT MNCRL AB 4-0 PS2 18 (SUTURE) ×6 IMPLANT
SUT VICRYL 0 UR6 27IN ABS (SUTURE) ×3 IMPLANT
SYR 20ML LL LF (SYRINGE) ×6 IMPLANT
SYS BAG RETRIEVAL 10MM (BASKET) ×1
SYSTEM BAG RETRIEVAL 10MM (BASKET) ×2 IMPLANT
TROCAR ENDO BLADELESS 11MM (ENDOMECHANICALS) ×3 IMPLANT
TROCAR XCEL NON-BLD 5MMX100MML (ENDOMECHANICALS) ×3 IMPLANT
TROCAR XCEL UNIV SLVE 11M 100M (ENDOMECHANICALS) ×3 IMPLANT
TUBE CONNECTING 12X1/4 (SUCTIONS) ×3 IMPLANT
WARMER LAPAROSCOPE (MISCELLANEOUS) ×3 IMPLANT

## 2019-08-22 NOTE — ED Notes (Signed)
Dr Scherrie November at bedside.

## 2019-08-22 NOTE — H&P (Signed)
History and Physical    Randy Thomas [id number] DOB: 1936/09/01 DOA: 08/22/2019  PCP: Susy Frizzle, MD  Patient coming from: Home  I have personally briefly reviewed patient's old medical records in [place]  Chief Complaint: Abdominal pain  HPI: Randy Thomas is a 83 y.o. male with medical history significant of hypertension, hyperlipidemia, coronary artery disease, GERD who presented to the ER with abdominal pain.  Patient had relatively sudden onset abdominal pain mainly in the epigastric region starting about 3 hours prior to arrival to the ER.  Described as acute onset intense pain associated with mild burning.  Nonradiating, associated with nausea and 2 episodes of vomiting.  No exacerbating or relieving factors. Patient had a CT of the abdomen and was found to have a gallstone lodged in the neck of the gallbladder with mild gallbladder distention.  Case was discussed with Dr. Arnoldo Morale from the surgical service who agreed to consult on the patient and requested the patient be kept n.p.o.  Patient is being admitted to the hospitalist service. ED Course:  Vital Signs reviewed on presentation, significant for heart rate 63, blood pressure 133/60, saturation 96% on room air.  Patient is afebrile. Labs reviewed, significant for sodium 139, potassium 4.0, chloride 104, BUN 18, creatinine 0.75, glucose 160, LFTs within normal limits, WBC count 8.0, hemoglobin 12.8, hematocrit 38.7, platelets 143, SARS-CoV-2 RT-PCR is pending. Imaging personally Reviewed, CT of the abdomen pelvis showed umbilical hernia containing fat and omental vessels with no bowel involvement, similar to prior exam.  Moderate aortic atherosclerosis without aortic aneurysm.  Calcified gallstone in the gallbladder neck with mild gallbladder distention.  Upper normal CBD at 8 mm.  No visualized choledocholithiasis.  Colonic diverticulosis, enlarged prostate gland   Review of Systems: As per HPI otherwise 10  point review of systems negative.  All other review of systems is negative except the ones noted above in the HPI.  Past Medical History:  Diagnosis Date   Allergy    GERD (gastroesophageal reflux disease)    Hyperlipidemia    Hypertension    Myocardial infarction [facility])     Past Surgical History:  Procedure Laterality Date   FRACTURE SURGERY     1967 wore cast     reports that he quit smoking about 37 years ago. He has never used smokeless tobacco. He reports that he does not drink alcohol or use drugs.  No Known Allergies  Family History  Problem Relation Age of Onset   Asthma Mother    Hearing loss Mother    Heart disease Father    Diabetes Brother    Diabetes Maternal Grandmother    Family history reviewed, noted as above, not pertinent to current presentation.   Prior to Admission medications   Medication Sig Start Date End Date Taking? Authorizing Provider  acetaminophen (TYLENOL ARTHRITIS PAIN) 650 MG CR tablet Take 2 tablets (1,300 mg total) by mouth every 8 (eight) hours as needed. For pain 11/19/16   Orlena Sheldon, PA-C  aspirin EC 81 MG tablet Take 1 tablet (81 mg total) by mouth daily. 11/19/16   Orlena Sheldon, PA-C  cetirizine (ZYRTEC) 10 MG tablet Take 1 tablet (10 mg total) by mouth daily. 05/03/19   Susy Frizzle, MD  cholecalciferol (VITAMIN D) 1000 units tablet Take 1 tablet (1,000 Units total) by mouth daily. 11/19/16   Orlena Sheldon, PA-C  HYDROcodone-acetaminophen (NORCO/VICODIN) 5-325 MG tablet Take 1 tablet by mouth every 6 (six) hours as  needed. 05/03/19   Susy Frizzle, MD  lansoprazole (PREVACID) 30 MG capsule Take 1 capsule (30 mg total) by mouth 2 (two) times daily. 11/19/16   Orlena Sheldon, PA-C  methocarbamol (ROBAXIN) 750 MG tablet Take 1 tablet (750 mg total) by mouth 4 (four) times daily. 05/03/19   Susy Frizzle, MD  metoprolol succinate (TOPROL-XL) 50 MG 24 hr tablet TAKE 1/2 TABLET BY MOUTH IN THE MORNING AND 1/2 TABLET BY MOUTH  IN THE EVENING 11/19/16   Orlena Sheldon, PA-C  Multiple Vitamin (MULTIVITAMIN WITH MINERALS) TABS tablet Take 1 tablet by mouth daily. 11/19/16   Dixon, Stanton Kidney B, PA-C  omega-3 acid ethyl esters (LOVAZA) 1 g capsule TAKE 2 CAPSULES BY MOUTH TWICE DAILY 06/14/19   Adrian Prows, MD  ramipril (ALTACE) 2.5 MG capsule Take 1 capsule (2.5 mg total) by mouth daily. 11/19/16   Dena Billet B, PA-C  rosuvastatin (CRESTOR) 40 MG tablet Take 1 tablet (40 mg total) by mouth at bedtime. 11/19/16   Orlena Sheldon, PA-C    Physical Exam: Vitals:   08/22/19 0007 08/22/19 0105 08/22/19 0230 08/22/19 0300  BP: 100/76 (!) 136/53 (!) 127/58 133/68  Pulse: (!) 52 61 62 63  Resp: 19 15 (!) 21 17  SpO2: 93% 96% 96% 96%    Constitutional: NAD, calm, comfortable Vitals:   08/22/19 0007 08/22/19 0105 08/22/19 0230 08/22/19 0300  BP: 100/76 (!) 136/53 (!) 127/58 133/68  Pulse: (!) 52 61 62 63  Resp: 19 15 (!) 21 17  SpO2: 93% 96% 96% 96%   Eyes: PERRL, lids and conjunctivae normal ENMT: Mucous membranes are moist. Posterior pharynx clear of any exudate or lesions.Normal dentition.  Neck: normal, supple, no masses, no thyromegaly Respiratory: clear to auscultation bilaterally, no wheezing, no crackles. Normal respiratory effort. No accessory muscle use.  Cardiovascular: Regular rate and rhythm, no murmurs / rubs / gallops. No extremity edema. 2+ pedal pulses. No carotid bruits.  Abdomen: Epigastric tenderness noted, no guarding or rigidity, no masses palpated. No hepatosplenomegaly. Bowel sounds positive.  Musculoskeletal: no clubbing / cyanosis. No joint deformity upper and lower extremities. Good ROM, no contractures. Normal muscle tone.  Skin: no rashes, lesions, ulcers. No induration Neurologic: CN 2-12 grossly intact. Sensation intact, DTR normal. Strength 5/5 in all 4.  Psychiatric: Normal judgment and insight. Alert and oriented x 3. Normal mood.    Decubitus Ulcers: Not present on admission Catheters and tubes:  None   Labs on Admission: I have personally reviewed following labs and imaging studies  CBC: Recent Labs  Lab 08/22/19 0035  WBC 8.0  NEUTROABS 5.9  HGB 12.8*  HCT 38.7*  MCV 90.0  PLT [id number]*   Basic Metabolic Panel: Recent Labs  Lab 08/22/19 0035  NA 139  K 4.0  CL 104  CO2 26  GLUCOSE 160*  BUN 18  CREATININE 0.75  CALCIUM 8.6*   GFR: CrCl cannot be calculated (Unknown ideal weight.). Liver Function Tests: Recent Labs  Lab 08/22/19 0035  AST 23  ALT 22  ALKPHOS 69  BILITOT 0.8  PROT 6.7  ALBUMIN 4.1   Recent Labs  Lab 08/22/19 0035  LIPASE 26   No results for input(s): AMMONIA in the last 168 hours. Coagulation Profile: No results for input(s): INR, PROTIME in the last 168 hours. Cardiac Enzymes: No results for input(s): CKTOTAL, CKMB, CKMBINDEX, TROPONINI in the last 168 hours. BNP (last 3 results) No results for input(s): PROBNP in the last 8760 hours. HbA1C:  No results for input(s): HGBA1C in the last 72 hours. CBG: No results for input(s): GLUCAP in the last 168 hours. Lipid Profile: No results for input(s): CHOL, HDL, LDLCALC, TRIG, CHOLHDL, LDLDIRECT in the last 72 hours. Thyroid Function Tests: No results for input(s): TSH, T4TOTAL, FREET4, T3FREE, THYROIDAB in the last 72 hours. Anemia Panel: No results for input(s): VITAMINB12, FOLATE, FERRITIN, TIBC, IRON, RETICCTPCT in the last 72 hours. Urine analysis:    Component Value Date/Time   COLORURINE YELLOW 09/27/2012 [place] 09/27/2012 1445   LABSPEC 1.017 09/27/2012 1445   PHURINE 7.0 09/27/2012 1445   GLUCOSEU NEGATIVE 09/27/2012 1445   HGBUR NEGATIVE 09/27/2012 1445   BILIRUBINUR NEGATIVE 09/27/2012 1445   KETONESUR NEGATIVE 09/27/2012 1445   PROTEINUR NEGATIVE 09/27/2012 1445   UROBILINOGEN 0.2 09/27/2012 1445   NITRITE NEGATIVE 09/27/2012 1445   LEUKOCYTESUR NEGATIVE 09/27/2012 1445    Radiological Exams on Admission: Ct Abdomen Pelvis W Contrast  Result  Date: 08/22/2019 CLINICAL DATA:  Abdominal pain. Pulsatile abdominal mass, abdominal aortic aneurysm suspected. EXAM: CT ABDOMEN AND PELVIS WITH CONTRAST TECHNIQUE: Multidetector CT imaging of the abdomen and pelvis was performed using the standard protocol following bolus administration of intravenous contrast. CONTRAST:  169mL OMNIPAQUE IOHEXOL 300 MG/ML  SOLN COMPARISON:  Noncontrast CT 09/27/2012 FINDINGS: Lower chest: Mild lingular and left lower lobe scarring. No acute airspace disease. No pleural fluid. Hepatobiliary: No focal hepatic abnormality. Mild gallbladder distention without calcified gallstone at the gallbladder neck. Common bile duct prominence measuring 8 mm in the midportion. No calcified choledocholithiasis. Pancreas: No ductal dilatation or inflammation. Spleen: Normal in size without focal abnormality. Adrenals/Urinary Tract: Normal adrenal glands. No hydronephrosis or perinephric edema. Homogeneous renal enhancement with symmetric excretion on delayed phase imaging. Small cortical hypodensities in the right kidney are too small to accurately characterize. Exophytic 10 mm lesion from the anterior mid left kidney is unchanged from prior exam, and most consistent with hyperdense cyst. No evidence of enhancement on delayed phase imaging. Urinary bladder is physiologically distended without wall thickening. Stomach/Bowel: Stomach is unremarkable. Few fluid-filled nondilated pelvic bowel loops without wall thickening or inflammation. There is mild fecalization of distal small bowel contents. Normal appendix coiled in the right lower quadrant, series 8, image 39. Moderate stool burden throughout the colon without colonic wall thickening. Diverticulosis from the descending through the sigmoid, no acute diverticulitis. Vascular/Lymphatic: Moderate aortic atherosclerosis. No aortic aneurysm. No periaortic stranding or evidence of vasculitis. Incidental note of 2 left renal arteries. Portal vein is  patent. Mesenteric vessels are patent. No enlarged lymph nodes in the abdomen or pelvis. Reproductive: Enlarged prostate gland spans 5.2 cm transverse. Other: Umbilical hernia contains fat and omental vessels. Hernia neck measures 16 x 13 mm. Mild stranding in the hernia sac, however this appears similar to prior. No bowel involvement. There is fat within both inguinal canals. No ascites or free air. No evidence of intra-abdominal mass. Musculoskeletal: There are no acute or suspicious osseous abnormalities. Multilevel degenerative change in the spine. IMPRESSION: 1. Umbilical hernia contains fat and omental vessels. Mild stranding in the hernia sac may represent inflammatory change, however this appears similar to prior exam. No bowel involvement. 2. Moderate aortic atherosclerosis without aortic aneurysm or acute aortic abnormality. No evidence of intra-abdominal mass. 3. Calcified gallstone in the gallbladder neck with mild gallbladder distension. Upper normal common bile duct for age at 8 mm, no visualized choledocholithiasis. 4. Colonic diverticulosis without diverticulitis. 5. Enlarged prostate gland. Aortic Atherosclerosis (ICD10-I70.0). Electronically Signed  By: Keith Rake M.D.   On: 08/22/2019 01:55      Assessment/Plan Principal Problem:   Cholecystitis Active Problems:   Coronary artery disease   Hyperlipidemia   HTN (hypertension)     Principal Problem: Abdominal pain: Possibly secondary to cholelithiasis with early cholecystitis Patient presented with abdominal pain.  Found to have gallstone lodged in the neck of the gallbladder with slightly dilated CBD.  LFTs are within normal limits.  Lipase normal.  No jaundice, fever, leukocytosis noted. Plan: Will admit to MedSurg Pain meds as needed Keep n.p.o. Gentle IV fluid resuscitation General surgery consult in a.m., discussed with Dr. Arnoldo Morale by ED physician.  Other Active Problems: Hypertension:  Continue ramipril,  metoprolol with strict parameters  Hyperlipidemia: Continue Crestor, Lovaza  Coronary artery disease status post stent placement: Patient has a history of stent placement x3 Hold aspirin  Gastroesophageal flux disease: Continue pantoprazole   DVT prophylaxis: Lovenox Code Status:  Full code Family Communication: N/A  Disposition Plan: Admit as inpatient for probable early cholecystitis, will get surgical consultation in a.m. Consults called: N/A Admission status: Inpatient   Lynetta Mare MD [facility]  If 7PM-7AM, please contact night-coverage   08/22/2019, 3:02 AM

## 2019-08-22 NOTE — ED Triage Notes (Signed)
Pt reports abdominal pain that began 3 hours ago. Pt stating "it feels like its on fire." Pt also reporting N/V denies diarrhea.

## 2019-08-22 NOTE — Interval H&P Note (Signed)
History and Physical Interval Note:  08/22/2019 11:41 AM  Randy Thomas  has presented today for surgery, with the diagnosis of cholecystitis, cholelithiasis.  The various methods of treatment have been discussed with the patient and family. After consideration of risks, benefits and other options for treatment, the patient has consented to  Procedure(s): LAPAROSCOPIC CHOLECYSTECTOMY (N/A) as a surgical intervention.  The patient's history has been reviewed, patient examined, no change in status, stable for surgery.  I have reviewed the patient's chart and labs.  Questions were answered to the patient's satisfaction.     [facility] Signs

## 2019-08-22 NOTE — OR Nursing (Signed)
As patient was being moved to the operating room table, he suddenly sat up and stated that he did not want the surgery. A safety belt was laid across his lap and me and Marquita Palms, RN on each side of him with CRNA at head of bed. Dr. Arnoldo Morale and Dr. Charna Elizabeth were called in the room and patient revoked consent for surgery. Patient was assisted back on his stretcher and transported to phase 2 recovery.

## 2019-08-22 NOTE — ED Notes (Signed)
Patient transported to CT 

## 2019-08-22 NOTE — Transfer of Care (Signed)
Immediate Anesthesia Transfer of Care Note  Patient: Randy Thomas  Procedure(s) Performed: CANCELLED PROCEDURE - PATIENT REFUSED PROCEDURE AFTER HE MOVED OVER TO THE OR BED. (N/A )  Patient Location:back to preop nurse Anesthesia Type:none Level of Consciousness: awake, alert  and oriented  Airway & Oxygen Therapy: Patient Spontanous Breathing  Post-op Assessment: Report given to RN  Post vital signs: stable  Last Vitals:  Vitals Value Taken Time  BP 100/53 08/22/19 1222  Temp 37.4 C 08/22/19 1222  Pulse    Resp 20 08/22/19 1222  SpO2 93 % 08/22/19 1222    Last Pain:  Vitals:   08/22/19 1222  TempSrc: Oral  PainSc: 0-No pain         Complications: received no anesthesia.

## 2019-08-22 NOTE — Anesthesia Preprocedure Evaluation (Signed)
Anesthesia Evaluation  Patient identified by MRN, date of birth, ID band Patient awake    Reviewed: Allergy & Precautions, NPO status , Patient's Chart, lab work & pertinent test results  Airway Mallampati: III  TM Distance: >3 FB Neck ROM: Full    Dental  (+) Upper Dentures, Edentulous Lower   Pulmonary former smoker,    Pulmonary exam normal breath sounds clear to auscultation       Cardiovascular Exercise Tolerance: Good hypertension, + CAD and + Past MI   Rhythm:Regular Rate:Tachycardia  22-Aug-2019 07:44:52 [facility] ROUTINE RECORD Sinus rhythm Borderline T wave abnormalities Confirmed by Nat Christen [phone]) on 08/22/2019 9:42:04 AM   Neuro/Psych negative neurological ROS  negative psych ROS   GI/Hepatic GERD  Medicated and Controlled,Acute cholecystitis    Endo/Other    Renal/GU      Musculoskeletal   Abdominal   Peds  Hematology   Anesthesia Other Findings   Reproductive/Obstetrics                             Anesthesia Physical Anesthesia Plan  ASA: III  Anesthesia Plan: General   Post-op Pain Management:    Induction: Intravenous  PONV Risk Score and Plan: 3 and Dexamethasone, Ondansetron and Treatment may vary due to age or medical condition  Airway Management Planned: Oral ETT  Additional Equipment:   Intra-op Plan:   Post-operative Plan: Extubation in OR  Informed Consent: I have reviewed the patients History and Physical, chart, labs and discussed the procedure including the risks, benefits and alternatives for the proposed anesthesia with the patient or authorized representative who has indicated his/her understanding and acceptance.       Plan Discussed with: CRNA  Anesthesia Plan Comments:         Anesthesia Quick Evaluation

## 2019-08-22 NOTE — Progress Notes (Signed)
Received pt from OR. Report taken from [place] pt revoked consent & refused procedure. Dr. Arnoldo Morale in to talk to pt & son.  Verbal discharge instructions given. Pt. & son verbalized understanding.

## 2019-08-22 NOTE — ED Provider Notes (Signed)
[facility] EMERGENCY DEPARTMENT Provider Note   CSN: [id number] Arrival date & time: 08/21/19  2344     History   Chief Complaint Chief Complaint  Patient presents with   Abdominal Pain    HPI Randy Thomas is a 83 y.o. male.     HPI  This patient is an 83 year old male with a history of myocardial infarction, hypertension and hyperlipidemia.  He has stents in his heart but has never had any abdominal surgery.  He states that approximately 3 hours ago he had acute onset of a burning intense pain in the mid abdomen which seems to extend from just below the bellybutton to the epigastrium.  This does not radiate to the back, it was associated with 2 episodes of vomiting.  He has not had any alcohol since [age] and has not had any tobacco since 1983.  This pain is persistent, nothing seems to make it better or worse, no medications given prehospital, he did vomit twice at home.  Arrives by family member private transportation.  Past Medical History:  Diagnosis Date   Allergy    GERD (gastroesophageal reflux disease)    Hyperlipidemia    Hypertension    Myocardial infarction [facility])     Patient Active Problem List   Diagnosis Date Noted   Cholecystitis 08/22/2019   Coronary artery disease 10/10/2015   Hyperlipidemia 10/10/2015   HTN (hypertension) 10/10/2015   Vitamin D deficiency 10/10/2015    Past Surgical History:  Procedure Laterality Date   FRACTURE SURGERY     1967 wore cast        Home Medications    Prior to Admission medications   Medication Sig Start Date End Date Taking? Authorizing Provider  acetaminophen (TYLENOL ARTHRITIS PAIN) 650 MG CR tablet Take 2 tablets (1,300 mg total) by mouth every 8 (eight) hours as needed. For pain 11/19/16   Orlena Sheldon, PA-C  aspirin EC 81 MG tablet Take 1 tablet (81 mg total) by mouth daily. 11/19/16   Orlena Sheldon, PA-C  cetirizine (ZYRTEC) 10 MG tablet Take 1 tablet (10 mg total) by mouth daily. 05/03/19    Susy Frizzle, MD  cholecalciferol (VITAMIN D) 1000 units tablet Take 1 tablet (1,000 Units total) by mouth daily. 11/19/16   Orlena Sheldon, PA-C  HYDROcodone-acetaminophen (NORCO/VICODIN) 5-325 MG tablet Take 1 tablet by mouth every 6 (six) hours as needed. 05/03/19   Susy Frizzle, MD  lansoprazole (PREVACID) 30 MG capsule Take 1 capsule (30 mg total) by mouth 2 (two) times daily. 11/19/16   Orlena Sheldon, PA-C  methocarbamol (ROBAXIN) 750 MG tablet Take 1 tablet (750 mg total) by mouth 4 (four) times daily. 05/03/19   Susy Frizzle, MD  metoprolol succinate (TOPROL-XL) 50 MG 24 hr tablet TAKE 1/2 TABLET BY MOUTH IN THE MORNING AND 1/2 TABLET BY MOUTH IN THE EVENING 11/19/16   Orlena Sheldon, PA-C  Multiple Vitamin (MULTIVITAMIN WITH MINERALS) TABS tablet Take 1 tablet by mouth daily. 11/19/16   Dixon, Stanton Kidney B, PA-C  omega-3 acid ethyl esters (LOVAZA) 1 g capsule TAKE 2 CAPSULES BY MOUTH TWICE DAILY 06/14/19   Adrian Prows, MD  ramipril (ALTACE) 2.5 MG capsule Take 1 capsule (2.5 mg total) by mouth daily. 11/19/16   Dena Billet B, PA-C  rosuvastatin (CRESTOR) 40 MG tablet Take 1 tablet (40 mg total) by mouth at bedtime. 11/19/16   Orlena Sheldon, PA-C    Family History Family History  Problem  Relation Age of Onset   Asthma Mother    Hearing loss Mother    Heart disease Father    Diabetes Brother    Diabetes Maternal Grandmother     Social History Social History   Tobacco Use   Smoking status: Former Smoker    Quit date: 09/15/1981    Years since quitting: 37.9   Smokeless tobacco: Never Used  Substance Use Topics   Alcohol use: No    Alcohol/week: 0.0 standard drinks   Drug use: No     Allergies   Patient has no known allergies.   Review of Systems Review of Systems  All other systems reviewed and are negative.    Physical Exam Updated Vital Signs BP (!) 136/53    Pulse 61    Resp 15    SpO2 96%   Physical Exam Vitals signs and nursing note reviewed.    Constitutional:      General: He is not in acute distress.    Appearance: He is well-developed.  HENT:     Head: Normocephalic and atraumatic.     Mouth/Throat:     Pharynx: No oropharyngeal exudate.  Eyes:     General: No scleral icterus.       Right eye: No discharge.        Left eye: No discharge.     Conjunctiva/sclera: Conjunctivae normal.     Pupils: Pupils are equal, round, and reactive to light.  Neck:     Musculoskeletal: Normal range of motion and neck supple.     Thyroid: No thyromegaly.     Vascular: No JVD.  Cardiovascular:     Rate and Rhythm: Normal rate and regular rhythm.     Heart sounds: Normal heart sounds. No murmur. No friction rub. No gallop.   Pulmonary:     Effort: Pulmonary effort is normal. No respiratory distress.     Breath sounds: Normal breath sounds. No wheezing or rales.  Abdominal:     General: Bowel sounds are normal. There is no distension.     Palpations: Abdomen is soft. There is no mass.     Tenderness: There is abdominal tenderness.     Comments: The abdomen is slightly rotund, he has reproducible tenderness from the periumbilical region through the epigastrium and RUQ but no guarding or peritoneal signs, no lower abdominal tenderness.  No CVA tenderness No Murphy sign.  Musculoskeletal: Normal range of motion.        General: No tenderness.  Lymphadenopathy:     Cervical: No cervical adenopathy.  Skin:    General: Skin is warm and dry.     Findings: No erythema or rash.  Neurological:     Mental Status: He is alert.     Coordination: Coordination normal.  Psychiatric:        Behavior: Behavior normal.      ED Treatments / Results  Labs (all labs ordered are listed, but only abnormal results are displayed) Labs Reviewed  CBC WITH DIFFERENTIAL/PLATELET - Abnormal; Notable for the following components:      Result Value   Hemoglobin 12.8 (*)    HCT 38.7 (*)    Platelets 143 (*)    All other components within normal limits   COMPREHENSIVE METABOLIC PANEL - Abnormal; Notable for the following components:   Glucose, Bld 160 (*)    Calcium 8.6 (*)    All other components within normal limits  LIPASE, BLOOD  URINALYSIS, ROUTINE W REFLEX MICROSCOPIC  POC  SARS CORONAVIRUS 2 AG -  ED    EKG None  Radiology Ct Abdomen Pelvis W Contrast  Result Date: 08/22/2019 CLINICAL DATA:  Abdominal pain. Pulsatile abdominal mass, abdominal aortic aneurysm suspected. EXAM: CT ABDOMEN AND PELVIS WITH CONTRAST TECHNIQUE: Multidetector CT imaging of the abdomen and pelvis was performed using the standard protocol following bolus administration of intravenous contrast. CONTRAST:  170mL OMNIPAQUE IOHEXOL 300 MG/ML  SOLN COMPARISON:  Noncontrast CT 09/27/2012 FINDINGS: Lower chest: Mild lingular and left lower lobe scarring. No acute airspace disease. No pleural fluid. Hepatobiliary: No focal hepatic abnormality. Mild gallbladder distention without calcified gallstone at the gallbladder neck. Common bile duct prominence measuring 8 mm in the midportion. No calcified choledocholithiasis. Pancreas: No ductal dilatation or inflammation. Spleen: Normal in size without focal abnormality. Adrenals/Urinary Tract: Normal adrenal glands. No hydronephrosis or perinephric edema. Homogeneous renal enhancement with symmetric excretion on delayed phase imaging. Small cortical hypodensities in the right kidney are too small to accurately characterize. Exophytic 10 mm lesion from the anterior mid left kidney is unchanged from prior exam, and most consistent with hyperdense cyst. No evidence of enhancement on delayed phase imaging. Urinary bladder is physiologically distended without wall thickening. Stomach/Bowel: Stomach is unremarkable. Few fluid-filled nondilated pelvic bowel loops without wall thickening or inflammation. There is mild fecalization of distal small bowel contents. Normal appendix coiled in the right lower quadrant, series 8, image 39. Moderate  stool burden throughout the colon without colonic wall thickening. Diverticulosis from the descending through the sigmoid, no acute diverticulitis. Vascular/Lymphatic: Moderate aortic atherosclerosis. No aortic aneurysm. No periaortic stranding or evidence of vasculitis. Incidental note of 2 left renal arteries. Portal vein is patent. Mesenteric vessels are patent. No enlarged lymph nodes in the abdomen or pelvis. Reproductive: Enlarged prostate gland spans 5.2 cm transverse. Other: Umbilical hernia contains fat and omental vessels. Hernia neck measures 16 x 13 mm. Mild stranding in the hernia sac, however this appears similar to prior. No bowel involvement. There is fat within both inguinal canals. No ascites or free air. No evidence of intra-abdominal mass. Musculoskeletal: There are no acute or suspicious osseous abnormalities. Multilevel degenerative change in the spine. IMPRESSION: 1. Umbilical hernia contains fat and omental vessels. Mild stranding in the hernia sac may represent inflammatory change, however this appears similar to prior exam. No bowel involvement. 2. Moderate aortic atherosclerosis without aortic aneurysm or acute aortic abnormality. No evidence of intra-abdominal mass. 3. Calcified gallstone in the gallbladder neck with mild gallbladder distension. Upper normal common bile duct for age at 8 mm, no visualized choledocholithiasis. 4. Colonic diverticulosis without diverticulitis. 5. Enlarged prostate gland. Aortic Atherosclerosis (ICD10-I70.0). Electronically Signed   By: Keith Rake M.D.   On: 08/22/2019 01:55    Procedures Ultrasound ED Abd  Date/Time: 08/22/2019 12:21 AM Performed by: Noemi Chapel, MD Authorized by: Noemi Chapel, MD   Procedure details:    Indications: abdominal pain     Assessment for:  AAA   Aorta:  Visualized   Images: not archived   Study Limitations: bowel gas Vascular findings:    Aorta: aorta normal (< 3cm)     Intra-abdominal fluid:  unidentified   Comments:         (including critical care time)  Medications Ordered in ED Medications  0.9 %  sodium chloride infusion ( Intravenous New Bag/Given 08/22/19 0107)  cefTRIAXone (ROCEPHIN) 2 g in sodium chloride 0.9 % 100 mL IVPB (has no administration in time range)  fentaNYL (SUBLIMAZE) injection 50 mcg (50 mcg  Intravenous Given 08/22/19 0107)  pantoprazole (PROTONIX) injection 40 mg (40 mg Intravenous Given 08/22/19 0108)  iohexol (OMNIPAQUE) 300 MG/ML solution 100 mL (100 mLs Intravenous Contrast Given 08/22/19 0129)  fentaNYL (SUBLIMAZE) injection 50 mcg (50 mcg Intravenous Given 08/22/19 0229)     Initial Impression / Assessment and Plan / ED Course  I have reviewed the triage vital signs and the nursing notes.  Pertinent labs & imaging results that were available during my care of the patient were reviewed by me and considered in my medical decision making (see chart for details).  Clinical Course as of Aug 21 300  Mon Aug 22, 2019  0214 Specifically the patient has no significant tenderness around his umbilical fat-containing hernia.  The CT scan of the abdomen and pelvis does not fact confirm that there is a gallstone in the neck of the gallbladder with common bile duct of 8 mm in diameter.  His pain is returned, will discuss with general surgery   [BM]    Clinical Course User Index [BM] Noemi Chapel, MD       In this elderly male I would consider finding such as pancreatitis, cholecystitis, peptic ulcer disease, abdominal aortic aneurysm, mesenteric ischemia though less likely.  This did not seem to be brought on by eating and he has no history of atrial fibrillation.  The patient will need a CT scan, labs, saline lock with some fluids, his blood pressure is borderline hypotensive.  He is not tachycardic or febrile.  I have performed a bedside ultrasound, the point-of-care ultrasound reveals no obvious aneurysm however there was a lot of overlying gas  limiting the utility of the study.  He will need a formal CT scan.  The patient has a gallstone in the neck of his gallbladder with a common bile duct of approximately 8 mm in diameter.  At this time the patient will need to have general surgical consultation for what appears to be possible biliary colic versus early cholecystitis.  Thankfully there is no leukocytosis, no elevation of liver function tests and no fever or jaundice.  The patient is agreeable to the plan, his pain is come back, he will need another dose of fentanyl.  Multiple doses of narcotic medications were given.  D/w Dr. Arnoldo Morale - will see in consultation -  Requests NPO and hospitalist will admit. D/w Dr. Scherrie November who has been kind enough to admit Rocephin given per ED Antibiotic algorithm  Final Clinical Impressions(s) / ED Diagnoses   Final diagnoses:  Cholecystitis    ED Discharge Orders    None       Noemi Chapel, MD 08/22/19 0301

## 2019-08-22 NOTE — Progress Notes (Addendum)
Per HPI: Randy Thomas is a 83 y.o. male with medical history significant of hypertension, hyperlipidemia, coronary artery disease, GERD who presented to the ER with abdominal pain.  Patient had relatively sudden onset abdominal pain mainly in the epigastric region starting about 3 hours prior to arrival to the ER.  Described as acute onset intense pain associated with mild burning.  Nonradiating, associated with nausea and 2 episodes of vomiting.  No exacerbating or relieving factors. Patient had a CT of the abdomen and was found to have a gallstone lodged in the neck of the gallbladder with mild gallbladder distention.  Case was discussed with Dr. Arnoldo Morale from the surgical service who agreed to consult on the patient and requested the patient be kept n.p.o.  Patient is being admitted to the hospitalist service.  12/7: Patient was admitted with abdominal pain secondary to cholelithiasis with early findings of cholecystitis.  General surgery has evaluated patient with plans for cholecystectomy noted this afternoon.  He is currently n.p.o. and on IV fluids.  Vital signs are stable.  Continue home medications as otherwise prescribed and continue to monitor closely perioperatively.  We will plan for repeat labs in a.m.  No other acute concerns noted at this time.  He is feeling better after taking some Tylenol for pain this morning.  Patient is very hard of hearing and I have also discussed the plan with son at bedside.  Update: Patient is being discharged by general surgery as he became acutely anxious prior to the procedure and refused to have a cholecystectomy.  He is otherwise stable for discharge.  Total care time: 25 minutes.

## 2019-08-22 NOTE — H&P (View-Only) (Signed)
Reason for Consult: Symptomatic Cholelithiasis Referring Physician: Dr. Olen Pel is a very pleasant 83 y.o. male with a history of HTN, HLD, and CAD who presented on the evening of 12/6 with sudden onset epigastric pain associated with 2 episodes of emesis. Pain does not radiate, unrelated to food intake. Pt states that he has had pain like this before "on and off over the years" but never to this severity. Pain moderately well controlled with current regimen. Abdominal CT showed gallstone lodged in gallbladder neck with mild gallbladder distention. LFTs, bilirubin, WNL.   Past Medical History:  Diagnosis Date  . Allergy   . GERD (gastroesophageal reflux disease)   . Hyperlipidemia   . Hypertension   . Myocardial infarction [facility])     Past Surgical History:  Procedure Laterality Date  . FRACTURE SURGERY     1967 wore cast    Family History  Problem Relation Age of Onset  . Asthma Mother   . Hearing loss Mother   . Heart disease Father   . Diabetes Brother   . Diabetes Maternal Grandmother     Social History:  reports that he quit smoking about 37 years ago. He has never used smokeless tobacco. He reports that he does not drink alcohol or use drugs.  Allergies: No Known Allergies  Medications: I have reviewed the patient's current medications.  Results for orders placed or performed during the hospital encounter of 08/22/19 (from the past 48 hour(s))  CBC with Differential     Status: Abnormal   Collection Time: 08/22/19 12:35 AM  Result Value Ref Range   WBC 8.0 4.0 - 10.5 K/uL   RBC 4.30 4.22 - 5.81 MIL/uL   Hemoglobin 12.8 (L) 13.0 - 17.0 g/dL   HCT 38.7 (L) 39.0 - 52.0 %   MCV 90.0 80.0 - 100.0 fL   MCH 29.8 26.0 - 34.0 pg   MCHC 33.1 30.0 - 36.0 g/dL   RDW 12.9 11.5 - 15.5 %   Platelets 143 (L) 150 - 400 K/uL   nRBC 0.0 0.0 - 0.2 %   Neutrophils Relative % 74 %   Neutro Abs 5.9 1.7 - 7.7 K/uL   Lymphocytes Relative 15 %   Lymphs Abs 1.2  0.7 - 4.0 K/uL   Monocytes Relative 9 %   Monocytes Absolute 0.7 0.1 - 1.0 K/uL   Eosinophils Relative 1 %   Eosinophils Absolute 0.1 0.0 - 0.5 K/uL   Basophils Relative 1 %   Basophils Absolute 0.1 0.0 - 0.1 K/uL   Immature Granulocytes 0 %   Abs Immature Granulocytes 0.02 0.00 - 0.07 K/uL    Comment: Performed at [facility], [address] [place], [place] [address]  CMP     Status: Abnormal   Collection Time: 08/22/19 12:35 AM  Result Value Ref Range   Sodium 139 135 - 145 mmol/L   Potassium 4.0 3.5 - 5.1 mmol/L   Chloride 104 98 - 111 mmol/L   CO2 26 22 - 32 mmol/L   Glucose, Bld 160 (H) 70 - 99 mg/dL   BUN 18 8 - 23 mg/dL   Creatinine, Ser 0.75 0.61 - 1.24 mg/dL   Calcium 8.6 (L) 8.9 - 10.3 mg/dL   Total Protein 6.7 6.5 - 8.1 g/dL   Albumin 4.1 3.5 - 5.0 g/dL   AST 23 15 - 41 U/L   ALT 22 0 - 44 U/L   Alkaline Phosphatase 69 38 - 126 U/L  Total Bilirubin 0.8 0.3 - 1.2 mg/dL   GFR calc non Af Amer >60 >60 mL/min   GFR calc Af Amer >60 >60 mL/min   Anion gap 9 5 - 15    Comment: Performed at [facility], [address] [place], [place] [address]  Lipase     Status: None   Collection Time: 08/22/19 12:35 AM  Result Value Ref Range   Lipase 26 11 - 51 U/L    Comment: Performed at [facility], [address] [place], [place] [address]  Urinalysis, Routine w reflex microscopic     Status: Abnormal   Collection Time: 08/22/19  2:21 AM  Result Value Ref Range   Color, Urine STRAW (A) YELLOW   APPearance CLEAR CLEAR   Specific Gravity, Urine >1.046 (H) 1.005 - 1.030   pH 6.0 5.0 - 8.0   Glucose, UA NEGATIVE NEGATIVE mg/dL   Hgb urine dipstick NEGATIVE NEGATIVE   Bilirubin Urine NEGATIVE NEGATIVE   Ketones, ur NEGATIVE NEGATIVE mg/dL   Protein, ur NEGATIVE NEGATIVE mg/dL   Nitrite NEGATIVE NEGATIVE   Leukocytes,Ua NEGATIVE NEGATIVE    Comment: Performed at [facility], [address] [place], [place] [address]  SARS Coronavirus 2 by RT PCR (hospital order,  performed in [place] hospital lab) Nasopharyngeal Nasopharyngeal Swab     Status: None   Collection Time: 08/22/19  3:35 AM   Specimen: Nasopharyngeal Swab  Result Value Ref Range   SARS Coronavirus 2 NEGATIVE NEGATIVE    Comment: (NOTE) SARS-CoV-2 target nucleic acids are NOT DETECTED. The SARS-CoV-2 RNA is generally detectable in upper and lower respiratory specimens during the acute phase of infection. The lowest concentration of SARS-CoV-2 viral copies this assay can detect is 250 copies / mL. A negative result does not preclude SARS-CoV-2 infection and should not be used as the sole basis for treatment or other patient management decisions.  A negative result may occur with improper specimen collection / handling, submission of specimen other than nasopharyngeal swab, presence of viral mutation(s) within the areas targeted by this assay, and inadequate number of viral copies (<250 copies / mL). A negative result must be combined with clinical observations, patient history, and epidemiological information. Fact Sheet for Patients:   StrictlyIdeas.no Fact Sheet for Healthcare Providers: BankingDealers.co.za This test is not yet approved or cleared  by the [place] FDA and has been authorized for detection and/or diagnosis of SARS-CoV-2 by FDA under an Emergency Use Authorization (EUA).  This EUA will remain in effect (meaning this test can be used) for the duration of the COVID-19 declaration under Section 564(b)(1) of the Act, 21 U.S.C. section 360bbb-3(b)(1), unless the authorization is terminated or revoked sooner. Performed at [facility], [address] [place], [place] [id number]   Basic metabolic panel     Status: Abnormal   Collection Time: 08/22/19  6:29 AM  Result Value Ref Range   Sodium 139 135 - 145 mmol/L   Potassium 4.2 3.5 - 5.1 mmol/L   Chloride 103 98 - 111 mmol/L   CO2 26 22 - 32 mmol/L   Glucose, Bld  154 (H) 70 - 99 mg/dL   BUN 15 8 - 23 mg/dL   Creatinine, Ser 0.69 0.61 - 1.24 mg/dL   Calcium 8.7 (L) 8.9 - 10.3 mg/dL   GFR calc non Af Amer >60 >60 mL/min   GFR calc Af Amer >60 >60 mL/min   Anion gap 10 5 - 15    Comment: Performed at [facility], [address], [place], [place] [address]  CBC     Status: Abnormal   Collection Time: 08/22/19  6:29 AM  Result Value Ref Range   WBC 11.4 (H) 4.0 - 10.5 K/uL   RBC 4.18 (L) 4.22 - 5.81 MIL/uL   Hemoglobin 12.3 (L) 13.0 - 17.0 g/dL   HCT 37.6 (L) 39.0 - 52.0 %   MCV 90.0 80.0 - 100.0 fL   MCH 29.4 26.0 - 34.0 pg   MCHC 32.7 30.0 - 36.0 g/dL   RDW 13.1 11.5 - 15.5 %   Platelets 130 (L) 150 - 400 K/uL   nRBC 0.0 0.0 - 0.2 %    Comment: Performed at [facility], [address], [place], [place] [address]    Ct Abdomen Pelvis W Contrast  Result Date: 08/22/2019 CLINICAL DATA:  Abdominal pain. Pulsatile abdominal mass, abdominal aortic aneurysm suspected. EXAM: CT ABDOMEN AND PELVIS WITH CONTRAST TECHNIQUE: Multidetector CT imaging of the abdomen and pelvis was performed using the standard protocol following bolus administration of intravenous contrast. CONTRAST:  173mL OMNIPAQUE IOHEXOL 300 MG/ML  SOLN COMPARISON:  Noncontrast CT 09/27/2012 FINDINGS: Lower chest: Mild lingular and left lower lobe scarring. No acute airspace disease. No pleural fluid. Hepatobiliary: No focal hepatic abnormality. Mild gallbladder distention without calcified gallstone at the gallbladder neck. Common bile duct prominence measuring 8 mm in the midportion. No calcified choledocholithiasis. Pancreas: No ductal dilatation or inflammation. Spleen: Normal in size without focal abnormality. Adrenals/Urinary Tract: Normal adrenal glands. No hydronephrosis or perinephric edema. Homogeneous renal enhancement with symmetric excretion on delayed phase imaging. Small cortical hypodensities in the right kidney are too small to accurately characterize. Exophytic 10 mm lesion from  the anterior mid left kidney is unchanged from prior exam, and most consistent with hyperdense cyst. No evidence of enhancement on delayed phase imaging. Urinary bladder is physiologically distended without wall thickening. Stomach/Bowel: Stomach is unremarkable. Few fluid-filled nondilated pelvic bowel loops without wall thickening or inflammation. There is mild fecalization of distal small bowel contents. Normal appendix coiled in the right lower quadrant, series 8, image 39. Moderate stool burden throughout the colon without colonic wall thickening. Diverticulosis from the descending through the sigmoid, no acute diverticulitis. Vascular/Lymphatic: Moderate aortic atherosclerosis. No aortic aneurysm. No periaortic stranding or evidence of vasculitis. Incidental note of 2 left renal arteries. Portal vein is patent. Mesenteric vessels are patent. No enlarged lymph nodes in the abdomen or pelvis. Reproductive: Enlarged prostate gland spans 5.2 cm transverse. Other: Umbilical hernia contains fat and omental vessels. Hernia neck measures 16 x 13 mm. Mild stranding in the hernia sac, however this appears similar to prior. No bowel involvement. There is fat within both inguinal canals. No ascites or free air. No evidence of intra-abdominal mass. Musculoskeletal: There are no acute or suspicious osseous abnormalities. Multilevel degenerative change in the spine. IMPRESSION: 1. Umbilical hernia contains fat and omental vessels. Mild stranding in the hernia sac may represent inflammatory change, however this appears similar to prior exam. No bowel involvement. 2. Moderate aortic atherosclerosis without aortic aneurysm or acute aortic abnormality. No evidence of intra-abdominal mass. 3. Calcified gallstone in the gallbladder neck with mild gallbladder distension. Upper normal common bile duct for age at 8 mm, no visualized choledocholithiasis. 4. Colonic diverticulosis without diverticulitis. 5. Enlarged prostate gland.  Aortic Atherosclerosis (ICD10-I70.0). Electronically Signed   By: Keith Rake M.D.   On: 08/22/2019 01:55    ROS:  Pertinent items are noted in HPI.  Blood pressure (!) 125/55,  pulse 64, temperature 98.9 F (37.2 C), temperature source Oral, resp. rate 13, SpO2 94 %. Physical Exam:  General: Well appearing, in no acute distress HEENT: No scleral icterus Abdominal: Soft, non-distended, tender to deep palpation in RUQ. Murphy's sign negative. No rebound tenderness or guarding. Non-tender, reducible umbilical hernia noted. Lungs: CTAB, normal work of breathing  Assessment/Plan: Pain is likely secondary to cholelithiasis, with early signs of cholecystitis. Given evidence of gallbladder inflammation, and high likelihood of symptom recurrence, surgery recommended but not emergent. Risks and benefits of surgery discussed with patient, who is amenable to the procedure. Cholecystectomy planned for later this afternoon, with possible concurrent repair of umbilical hernia. Patient to remain NPO for now.    Ardean Larsen Mykael Trott MSIII 08/22/2019, 7:36 AM

## 2019-08-22 NOTE — Consult Note (Signed)
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 male with a history of HTN, HLD, and CAD who presented on the evening of 12/6 with sudden onset epigastric pain associated with 2 episodes of emesis. Pain does not radiate, unrelated to food intake. Pt states that he has had pain like this before "on and off over the years" but never to this severity. Pain moderately well controlled with current regimen. Abdominal CT showed gallstone lodged in gallbladder neck with mild gallbladder distention. LFTs, bilirubin, WNL.   Past Medical History:  Diagnosis Date  . Allergy   . GERD (gastroesophageal reflux disease)   . Hyperlipidemia   . Hypertension   . Myocardial infarction [facility])     Past Surgical History:  Procedure Laterality Date  . FRACTURE SURGERY     1967 wore cast    Family History  Problem Relation Age of Onset  . Asthma Mother   . Hearing loss Mother   . Heart disease Father   . Diabetes Brother   . Diabetes Maternal Grandmother     Social History:  reports that he quit smoking about 37 years ago. He has never used smokeless tobacco. He reports that he does not drink alcohol or use drugs.  Allergies: No Known Allergies  Medications: I have reviewed the patient's current medications.  Results for orders placed or performed during the hospital encounter of 08/22/19 (from the past 48 hour(s))  CBC with Differential     Status: Abnormal   Collection Time: 08/22/19 12:35 AM  Result Value Ref Range   WBC 8.0 4.0 - 10.5 K/uL   RBC 4.30 4.22 - 5.81 MIL/uL   Hemoglobin 12.8 (L) 13.0 - 17.0 g/dL   HCT 38.7 (L) 39.0 - 52.0 %   MCV 90.0 80.0 - 100.0 fL   MCH 29.8 26.0 - 34.0 pg   MCHC 33.1 30.0 - 36.0 g/dL   RDW 12.9 11.5 - 15.5 %   Platelets 143 (L) 150 - 400 K/uL   nRBC 0.0 0.0 - 0.2 %   Neutrophils Relative % 74 %   Neutro Abs 5.9 1.7 - 7.7 K/uL   Lymphocytes Relative 15 %   Lymphs Abs 1.2  0.7 - 4.0 K/uL   Monocytes Relative 9 %   Monocytes Absolute 0.7 0.1 - 1.0 K/uL   Eosinophils Relative 1 %   Eosinophils Absolute 0.1 0.0 - 0.5 K/uL   Basophils Relative 1 %   Basophils Absolute 0.1 0.0 - 0.1 K/uL   Immature Granulocytes 0 %   Abs Immature Granulocytes 0.02 0.00 - 0.07 K/uL    Comment: Performed at [facility], [address], [place], [place] [address]  CMP     Status: Abnormal   Collection Time: 08/22/19 12:35 AM  Result Value Ref Range   Sodium 139 135 - 145 mmol/L   Potassium 4.0 3.5 - 5.1 mmol/L   Chloride 104 98 - 111 mmol/L   CO2 26 22 - 32 mmol/L   Glucose, Bld 160 (H) 70 - 99 mg/dL   BUN 18 8 - 23 mg/dL   Creatinine, Ser 0.75 0.61 - 1.24 mg/dL   Calcium 8.6 (L) 8.9 - 10.3 mg/dL   Total Protein 6.7 6.5 - 8.1 g/dL   Albumin 4.1 3.5 - 5.0 g/dL   AST 23 15 - 41 U/L   ALT 22 0 - 44 U/L   Alkaline Phosphatase 69 38 - 126 U/L  Total Bilirubin 0.8 0.3 - 1.2 mg/dL   GFR calc non Af Amer >60 >60 mL/min   GFR calc Af Amer >60 >60 mL/min   Anion gap 9 5 - 15    Comment: Performed at [facility], [address] [place], [place] [address]  Lipase     Status: None   Collection Time: 08/22/19 12:35 AM  Result Value Ref Range   Lipase 26 11 - 51 U/L    Comment: Performed at [facility], [address] [place], [place] [address]  Urinalysis, Routine w reflex microscopic     Status: Abnormal   Collection Time: 08/22/19  2:21 AM  Result Value Ref Range   Color, Urine STRAW (A) YELLOW   APPearance CLEAR CLEAR   Specific Gravity, Urine >1.046 (H) 1.005 - 1.030   pH 6.0 5.0 - 8.0   Glucose, UA NEGATIVE NEGATIVE mg/dL   Hgb urine dipstick NEGATIVE NEGATIVE   Bilirubin Urine NEGATIVE NEGATIVE   Ketones, ur NEGATIVE NEGATIVE mg/dL   Protein, ur NEGATIVE NEGATIVE mg/dL   Nitrite NEGATIVE NEGATIVE   Leukocytes,Ua NEGATIVE NEGATIVE    Comment: Performed at [facility], [address], [place], [place] [address]  SARS Coronavirus 2 by RT PCR (hospital order,  performed in [place] hospital lab) Nasopharyngeal Nasopharyngeal Swab     Status: None   Collection Time: 08/22/19  3:35 AM   Specimen: Nasopharyngeal Swab  Result Value Ref Range   SARS Coronavirus 2 NEGATIVE NEGATIVE    Comment: (NOTE) SARS-CoV-2 target nucleic acids are NOT DETECTED. The SARS-CoV-2 RNA is generally detectable in upper and lower respiratory specimens during the acute phase of infection. The lowest concentration of SARS-CoV-2 viral copies this assay can detect is 250 copies / mL. A negative result does not preclude SARS-CoV-2 infection and should not be used as the sole basis for treatment or other patient management decisions.  A negative result may occur with improper specimen collection / handling, submission of specimen other than nasopharyngeal swab, presence of viral mutation(s) within the areas targeted by this assay, and inadequate number of viral copies (<250 copies / mL). A negative result must be combined with clinical observations, patient history, and epidemiological information. Fact Sheet for Patients:   StrictlyIdeas.no Fact Sheet for Healthcare Providers: BankingDealers.co.za This test is not yet approved or cleared  by the [place] FDA and has been authorized for detection and/or diagnosis of SARS-CoV-2 by FDA under an Emergency Use Authorization (EUA).  This EUA will remain in effect (meaning this test can be used) for the duration of the COVID-19 declaration under Section 564(b)(1) of the Act, 21 U.S.C. section 360bbb-3(b)(1), unless the authorization is terminated or revoked sooner. Performed at [facility], [address] [place], [place] [id number]   Basic metabolic panel     Status: Abnormal   Collection Time: 08/22/19  6:29 AM  Result Value Ref Range   Sodium 139 135 - 145 mmol/L   Potassium 4.2 3.5 - 5.1 mmol/L   Chloride 103 98 - 111 mmol/L   CO2 26 22 - 32 mmol/L   Glucose, Bld  154 (H) 70 - 99 mg/dL   BUN 15 8 - 23 mg/dL   Creatinine, Ser 0.69 0.61 - 1.24 mg/dL   Calcium 8.7 (L) 8.9 - 10.3 mg/dL   GFR calc non Af Amer >60 >60 mL/min   GFR calc Af Amer >60 >60 mL/min   Anion gap 10 5 - 15    Comment: Performed at [facility], [address] [place], [place] [address]  CBC     Status: Abnormal   Collection Time: 08/22/19  6:29 AM  Result Value Ref Range   WBC 11.4 (H) 4.0 - 10.5 K/uL   RBC 4.18 (L) 4.22 - 5.81 MIL/uL   Hemoglobin 12.3 (L) 13.0 - 17.0 g/dL   HCT 37.6 (L) 39.0 - 52.0 %   MCV 90.0 80.0 - 100.0 fL   MCH 29.4 26.0 - 34.0 pg   MCHC 32.7 30.0 - 36.0 g/dL   RDW 13.1 11.5 - 15.5 %   Platelets 130 (L) 150 - 400 K/uL   nRBC 0.0 0.0 - 0.2 %    Comment: Performed at [facility], [address], [place], [place] [address]    Ct Abdomen Pelvis W Contrast  Result Date: 08/22/2019 CLINICAL DATA:  Abdominal pain. Pulsatile abdominal mass, abdominal aortic aneurysm suspected. EXAM: CT ABDOMEN AND PELVIS WITH CONTRAST TECHNIQUE: Multidetector CT imaging of the abdomen and pelvis was performed using the standard protocol following bolus administration of intravenous contrast. CONTRAST:  13mL OMNIPAQUE IOHEXOL 300 MG/ML  SOLN COMPARISON:  Noncontrast CT 09/27/2012 FINDINGS: Lower chest: Mild lingular and left lower lobe scarring. No acute airspace disease. No pleural fluid. Hepatobiliary: No focal hepatic abnormality. Mild gallbladder distention without calcified gallstone at the gallbladder neck. Common bile duct prominence measuring 8 mm in the midportion. No calcified choledocholithiasis. Pancreas: No ductal dilatation or inflammation. Spleen: Normal in size without focal abnormality. Adrenals/Urinary Tract: Normal adrenal glands. No hydronephrosis or perinephric edema. Homogeneous renal enhancement with symmetric excretion on delayed phase imaging. Small cortical hypodensities in the right kidney are too small to accurately characterize. Exophytic 10 mm lesion from  the anterior mid left kidney is unchanged from prior exam, and most consistent with hyperdense cyst. No evidence of enhancement on delayed phase imaging. Urinary bladder is physiologically distended without wall thickening. Stomach/Bowel: Stomach is unremarkable. Few fluid-filled nondilated pelvic bowel loops without wall thickening or inflammation. There is mild fecalization of distal small bowel contents. Normal appendix coiled in the right lower quadrant, series 8, image 39. Moderate stool burden throughout the colon without colonic wall thickening. Diverticulosis from the descending through the sigmoid, no acute diverticulitis. Vascular/Lymphatic: Moderate aortic atherosclerosis. No aortic aneurysm. No periaortic stranding or evidence of vasculitis. Incidental note of 2 left renal arteries. Portal vein is patent. Mesenteric vessels are patent. No enlarged lymph nodes in the abdomen or pelvis. Reproductive: Enlarged prostate gland spans 5.2 cm transverse. Other: Umbilical hernia contains fat and omental vessels. Hernia neck measures 16 x 13 mm. Mild stranding in the hernia sac, however this appears similar to prior. No bowel involvement. There is fat within both inguinal canals. No ascites or free air. No evidence of intra-abdominal mass. Musculoskeletal: There are no acute or suspicious osseous abnormalities. Multilevel degenerative change in the spine. IMPRESSION: 1. Umbilical hernia contains fat and omental vessels. Mild stranding in the hernia sac may represent inflammatory change, however this appears similar to prior exam. No bowel involvement. 2. Moderate aortic atherosclerosis without aortic aneurysm or acute aortic abnormality. No evidence of intra-abdominal mass. 3. Calcified gallstone in the gallbladder neck with mild gallbladder distension. Upper normal common bile duct for age at 8 mm, no visualized choledocholithiasis. 4. Colonic diverticulosis without diverticulitis. 5. Enlarged prostate gland.  Aortic Atherosclerosis (ICD10-I70.0). Electronically Signed   By: Keith Rake M.D.   On: 08/22/2019 01:55    ROS:  Pertinent items are noted in HPI.  Blood pressure (!) 125/55,  pulse 64, temperature 98.9 F (37.2 C), temperature source Oral, resp. rate 13, SpO2 94 %. Physical Exam:  General: Well appearing, in no acute distress HEENT: No scleral icterus Abdominal: Soft, non-distended, tender to deep palpation in RUQ. Murphy's sign negative. No rebound tenderness or guarding. Non-tender, reducible umbilical hernia noted. Lungs: CTAB, normal work of breathing  Assessment/Plan: Pain is likely secondary to cholelithiasis, with early signs of cholecystitis. Given evidence of gallbladder inflammation, and high likelihood of symptom recurrence, surgery recommended but not emergent. Risks and benefits of surgery discussed with patient, who is amenable to the procedure. Cholecystectomy planned for later this afternoon, with possible concurrent repair of umbilical hernia. Patient to remain NPO for now.    Randy Thomas Randy Thomas MSIII 08/22/2019, 7:36 AM

## 2019-08-22 NOTE — Care Management (Addendum)
Patient has already discharged home from Periop/PACU area. Unable to give Code 44.    ADDENDUM:Spoke with PACU RN who discharged patient, patient was discharged about 12:45 from PACU. Condition Code 44 was not prompted/able to be seen by PACU RN.

## 2019-08-22 NOTE — Progress Notes (Signed)
As patient was being moved to the operating room table, he suddenly stated that he did not want the surgery.  He was alert and oriented.  He revoked consent for the surgery.  Son was notified.  Son states he has a history of PTSD and anxiety attacks.  The patient will be discharged from the post procedure unit.

## 2019-08-27 NOTE — Anesthesia Postprocedure Evaluation (Signed)
Anesthesia Post Note  Patient: Randy Thomas  Procedure(s) Performed: CANCELLED PROCEDURE - PATIENT REFUSED PROCEDURE AFTER HE MOVED OVER TO THE OR BED. (N/A )  Anesthesia Post Evaluation   Last Vitals:  Vitals:   08/22/19 1123 08/22/19 1222  BP: 140/67 (!) 100/53  Pulse:    Resp: 20 20  Temp: 36.8 C 37.4 C  SpO2: 96% 93%    Last Pain:  Vitals:   08/22/19 1222  TempSrc: Oral  PainSc: 0-No pain                 Raneem Mend[place] C Ashleigh Arya

## 2019-08-31 ENCOUNTER — Telehealth: Payer: Self-pay | Admitting: Family Medicine

## 2019-08-31 NOTE — Telephone Encounter (Signed)
Spoke with patient to follow up on recent Hospital discharge. Patient states that he is doing well. Denies any abdominal pain. States at the current moment symptoms have resolved. He is scheduled to see Dr.Jenkins on tomorrow to discuss gallbladder surgery. I have him scheduled to see Dr. Dennard Schaumann on 09/05/2019

## 2019-09-01 ENCOUNTER — Encounter: Payer: Self-pay | Admitting: General Surgery

## 2019-09-01 ENCOUNTER — Other Ambulatory Visit: Payer: Self-pay

## 2019-09-01 ENCOUNTER — Ambulatory Visit (INDEPENDENT_AMBULATORY_CARE_PROVIDER_SITE_OTHER): Payer: Medicare HMO | Admitting: General Surgery

## 2019-09-01 VITALS — BP 153/76 | HR 85 | Temp 97.5°F | Resp 18 | Ht 66.0 in | Wt 163.0 lb

## 2019-09-01 DIAGNOSIS — K8020 Calculus of gallbladder without cholecystitis without obstruction: Secondary | ICD-10-CM

## 2019-09-01 NOTE — Telephone Encounter (Signed)
Error

## 2019-09-01 NOTE — Progress Notes (Signed)
Subjective:     Randy Thomas  Here for hospital follow-up.  Patient had anxiety attack just before induction of anesthesia, thus his laparoscopic cholecystectomy was canceled.  Since that time, he has tried to adjust his diet.  He denies any right upper quadrant abdominal pain, nausea, vomiting, fever, chills, or jaundice.  He has 0 out of 10 abdominal pain. Objective:    BP (!) 153/76 (BP Location: Left Arm, Patient Position: Sitting, Cuff Size: Normal)   Pulse 85   Temp (!) 97.5 F (36.4 C) (Oral)   Resp 18   Ht 5\' 6"  (1.676 m)   Wt 163 lb (73.9 kg)   SpO2 97%   BMI 26.31 kg/m   General:  alert, cooperative and no distress  Abdomen is soft, nontender, nondistended.  No rigidity is noted.     Assessment:    Biliary colic with cholelithiasis, currently asymptomatic    Plan:   Patient would like to defer surgery as much as possible.  That is fine with me.  He was given literature concerning the signs and symptoms of cholecystitis.  He will follow-up with me as needed.  He was told that since he has had one attack of biliary colic, this could repeat itself over the next few years.

## 2019-09-01 NOTE — Patient Instructions (Signed)

## 2019-09-05 ENCOUNTER — Encounter: Payer: Self-pay | Admitting: Family Medicine

## 2019-09-05 ENCOUNTER — Ambulatory Visit (INDEPENDENT_AMBULATORY_CARE_PROVIDER_SITE_OTHER): Payer: Medicare HMO | Admitting: Family Medicine

## 2019-09-05 ENCOUNTER — Other Ambulatory Visit: Payer: Self-pay

## 2019-09-05 VITALS — BP 110/60 | HR 94 | Temp 97.8°F | Resp 16 | Ht 66.0 in | Wt 160.0 lb

## 2019-09-05 DIAGNOSIS — K8020 Calculus of gallbladder without cholecystitis without obstruction: Secondary | ICD-10-CM

## 2019-09-05 NOTE — Progress Notes (Signed)
Subjective:    Patient ID: Randy Thomas, male    DOB: [birth year], 83 y.o.   MRN: [id number]  HPI Patient was admitted to the hospital on December 7 and a CT scan obtained at that time showed cholelithiasis:  IMPRESSION: 1. Umbilical hernia contains fat and omental vessels. Mild stranding in the hernia sac may represent inflammatory change, however this appears similar to prior exam. No bowel involvement. 2. Moderate aortic atherosclerosis without aortic aneurysm or acute aortic abnormality. No evidence of intra-abdominal mass. 3. Calcified gallstone in the gallbladder neck with mild gallbladder distension. Upper normal common bile duct for age at 8 mm, no visualized choledocholithiasis. 4. Colonic diverticulosis without diverticulitis. 5. Enlarged prostate gland.  It was thought that the patient may be developing early cholecystitis.  He was admitted to the hospital and kept n.p.o.  He was scheduled for a cholecystectomy.  However the patient had a panic attack on the way to the operating room and rescinded his consent for surgery.  As his symptoms had improved he was discharged from the hospital.  He recently had an outpatient follow-up with Dr. Arnoldo Morale.  Per the note, the patient was asymptomatic and at the present time no cholecystectomy was planned as an outpatient as the patient wishes to defer surgery unless symptoms return.  He is here today for follow-up.  Patient seems to be doing remarkably well.  He denies any further abdominal pain.  Patient states that his pain originally started suddenly directly below the xiphoid process and radiated down in the center of his abdomen to his umbilicus.  He described it as a sharp burning constant pain that was unbearable.  Since his hospitalization he has not experienced that at all.  He denies any fever.  He denies any jaundice.  He denies any nausea or vomiting or melena or hematochezia.  He has essentially been completely  asymptomatic.  Past Medical History:  Diagnosis Date  . Allergy   . GERD (gastroesophageal reflux disease)   . Hyperlipidemia   . Hypertension   . Myocardial infarction [facility])    Past Surgical History:  Procedure Laterality Date  . [place] wore cast   Current Outpatient Medications on File Prior to Visit  Medication Sig Dispense Refill  . acetaminophen (TYLENOL ARTHRITIS PAIN) 650 MG CR tablet Take 2 tablets (1,300 mg total) by mouth every 8 (eight) hours as needed. For pain 120 tablet 2  . aspirin EC 81 MG tablet Take 1 tablet (81 mg total) by mouth daily. 30 tablet 3  . cetirizine (ZYRTEC) 10 MG tablet Take 1 tablet (10 mg total) by mouth daily. 30 tablet 11  . cholecalciferol (VITAMIN D) 1000 units tablet Take 1 tablet (1,000 Units total) by mouth daily. 30 tablet 3  . HYDROcodone-acetaminophen (NORCO/VICODIN) 5-325 MG tablet Take 1 tablet by mouth every 6 (six) hours as needed. 15 tablet 0  . lansoprazole (PREVACID) 30 MG capsule Take 1 capsule (30 mg total) by mouth 2 (two) times daily. 60 capsule 3  . methocarbamol (ROBAXIN) 750 MG tablet Take 1 tablet (750 mg total) by mouth 4 (four) times daily. 60 tablet 2  . metoprolol succinate (TOPROL-XL) 50 MG 24 hr tablet TAKE 1/2 TABLET BY MOUTH IN THE MORNING AND 1/2 TABLET BY MOUTH IN THE EVENING 60 tablet 4  . Multiple Vitamin (MULTIVITAMIN WITH MINERALS) TABS tablet Take 1 tablet by mouth daily. 30 tablet 3  . omega-3 acid ethyl esters (LOVAZA) 1 g  capsule TAKE 2 CAPSULES BY MOUTH TWICE DAILY 360 capsule 0  . ramipril (ALTACE) 2.5 MG capsule Take 1 capsule (2.5 mg total) by mouth daily. 90 capsule 1  . rosuvastatin (CRESTOR) 40 MG tablet Take 1 tablet (40 mg total) by mouth at bedtime. 90 tablet 0   No current facility-administered medications on file prior to visit.   No Known Allergies  Social History   Socioeconomic History  . Marital status: Married    Spouse name: Not on file  . Number of children: Not  on file  . Years of education: Not on file  . Highest education level: Not on file  Occupational History  . Not on file  Tobacco Use  . Smoking status: Former Smoker    Quit date: 09/15/1981    Years since quitting: 37.9  . Smokeless tobacco: Never Used  Substance and Sexual Activity  . Alcohol use: No    Alcohol/week: 0.0 standard drinks  . Drug use: No  . Sexual activity: Not Currently  Other Topics Concern  . Not on file  Social History Narrative  . Not on file   Social Determinants of Health   Financial Resource Strain:   . Difficulty of Paying Living Expenses: Not on file  Food Insecurity:   . Worried About Charity fundraiser in the Last Year: Not on file  . Ran Out of Food in the Last Year: Not on file  Transportation Needs:   . Lack of Transportation (Medical): Not on file  . Lack of Transportation (Non-Medical): Not on file  Physical Activity:   . Days of Exercise per Week: Not on file  . Minutes of Exercise per Session: Not on file  Stress:   . Feeling of Stress : Not on file  Social Connections:   . Frequency of Communication with Friends and Family: Not on file  . Frequency of Social Gatherings with Friends and Family: Not on file  . Attends Religious Services: Not on file  . Active Member of Clubs or Organizations: Not on file  . Attends Archivist Meetings: Not on file  . Marital Status: Not on file  Intimate Partner Violence:   . Fear of Current or Ex-Partner: Not on file  . Emotionally Abused: Not on file  . Physically Abused: Not on file  . Sexually Abused: Not on file    Review of Systems  All other systems reviewed and are negative.      Objective:   Physical Exam Vitals reviewed.  Constitutional:      Appearance: Normal appearance. He is normal weight. He is not ill-appearing or diaphoretic.  Cardiovascular:     Rate and Rhythm: Normal rate and regular rhythm.     Heart sounds: Normal heart sounds. No murmur. No friction rub. No  gallop.   Pulmonary:     Effort: Pulmonary effort is normal. No respiratory distress.     Breath sounds: Normal breath sounds. No stridor. No wheezing, rhonchi or rales.  Abdominal:     General: Abdomen is flat. Bowel sounds are normal. There is no distension.     Palpations: Abdomen is soft.     Tenderness: There is no abdominal tenderness. There is no guarding or rebound.  Musculoskeletal:     Right lower leg: No edema.     Left lower leg: No edema.  Neurological:     Mental Status: He is alert.           Assessment & Plan:  Calculus of gallbladder without cholecystitis without obstruction - Plan: CBC with Differential, COMPLETE METABOLIC PANEL WITH GFR  Abdominal exam today is completely benign.  Spent the entire office visit discussing signs and symptoms of cholecystitis.  Try to explain the difference between biliary colic and cholecystitis.  If the patient develops unrelenting pain, fever, jaundice, he is to go directly to the hospital.  Explained that he would have to have surgery at that point.  Otherwise as long as he is not experiencing pain, we can certainly simply monitor the situation.  Patient is comfortable with this plan.

## 2019-09-06 LAB — CBC WITH DIFFERENTIAL/PLATELET
Absolute Monocytes: 679 cells/uL (ref 200–950)
Basophils Absolute: 83 cells/uL (ref 0–200)
Basophils Relative: 1.4 %
Eosinophils Absolute: 171 cells/uL (ref 15–500)
Eosinophils Relative: 2.9 %
HCT: 39.1 % (ref 38.5–50.0)
Hemoglobin: 13.5 g/dL (ref 13.2–17.1)
Lymphs Abs: 1605 cells/uL (ref 850–3900)
MCH: 30.5 pg (ref 27.0–33.0)
MCHC: 34.5 g/dL (ref 32.0–36.0)
MCV: 88.5 fL (ref 80.0–100.0)
MPV: 10.6 fL (ref 7.5–12.5)
Monocytes Relative: 11.5 %
Neutro Abs: 3363 cells/uL (ref 1500–7800)
Neutrophils Relative %: 57 %
Platelets: 409 10*3/uL — ABNORMAL HIGH (ref 140–400)
RBC: 4.42 10*6/uL (ref 4.20–5.80)
RDW: 13.1 % (ref 11.0–15.0)
Total Lymphocyte: 27.2 %
WBC: 5.9 10*3/uL (ref 3.8–10.8)

## 2019-09-06 LAB — COMPLETE METABOLIC PANEL WITH GFR
AG Ratio: 1.5 (calc) (ref 1.0–2.5)
ALT: 32 U/L (ref 9–46)
AST: 18 U/L (ref 10–35)
Albumin: 3.9 g/dL (ref 3.6–5.1)
Alkaline phosphatase (APISO): 80 U/L (ref 35–144)
BUN: 17 mg/dL (ref 7–25)
CO2: 27 mmol/L (ref 20–32)
Calcium: 9.2 mg/dL (ref 8.6–10.3)
Chloride: 102 mmol/L (ref 98–110)
Creat: 0.83 mg/dL (ref 0.70–1.11)
GFR, Est African American: 96 mL/min/{1.73_m2} (ref >=60)
GFR, Est Non African American: 83 mL/min/{1.73_m2} (ref >=60)
Globulin: 2.6 g/dL (calc) (ref 1.9–3.7)
Glucose, Bld: 102 mg/dL — ABNORMAL HIGH (ref 65–99)
Potassium: 4.8 mmol/L (ref 3.5–5.3)
Sodium: 139 mmol/L (ref 135–146)
Total Bilirubin: 0.6 mg/dL (ref 0.2–1.2)
Total Protein: 6.5 g/dL (ref 6.1–8.1)

## 2019-09-09 ENCOUNTER — Other Ambulatory Visit: Payer: Self-pay | Admitting: Cardiology

## 2019-10-03 ENCOUNTER — Other Ambulatory Visit: Payer: Self-pay

## 2019-10-03 MED ORDER — HYDROCODONE-ACETAMINOPHEN 5-325 MG PO TABS: 1.0000 | ORAL_TABLET | Freq: Four times a day (QID) | ORAL | 0 refills | Status: DC | PRN

## 2019-10-03 NOTE — Telephone Encounter (Signed)
Requested Prescriptions   Pending Prescriptions Disp Refills  . HYDROcodone-acetaminophen (NORCO/VICODIN) 5-325 MG tablet 15 tablet 0    Sig: Take 1 tablet by mouth every 6 (six) hours as needed.    Last OV 09/05/2019   Last written 05/03/2019

## 2019-11-11 ENCOUNTER — Ambulatory Visit (INDEPENDENT_AMBULATORY_CARE_PROVIDER_SITE_OTHER): Payer: Medicare HMO | Admitting: Family Medicine

## 2019-11-11 ENCOUNTER — Encounter: Payer: Self-pay | Admitting: Family Medicine

## 2019-11-11 ENCOUNTER — Other Ambulatory Visit: Payer: Self-pay

## 2019-11-11 VITALS — BP 130/60 | HR 92 | Temp 97.1°F | Resp 12 | Ht 66.0 in | Wt 149.0 lb

## 2019-11-11 DIAGNOSIS — E860 Dehydration: Secondary | ICD-10-CM | POA: Diagnosis not present

## 2019-11-11 DIAGNOSIS — R197 Diarrhea, unspecified: Secondary | ICD-10-CM

## 2019-11-11 DIAGNOSIS — R634 Abnormal weight loss: Secondary | ICD-10-CM | POA: Diagnosis not present

## 2019-11-11 LAB — CBC WITH DIFFERENTIAL/PLATELET
Absolute Monocytes: 896 cells/uL (ref 200–950)
Basophils Absolute: 56 cells/uL (ref 0–200)
Basophils Relative: 0.5 %
Eosinophils Absolute: 56 cells/uL (ref 15–500)
Eosinophils Relative: 0.5 %
HCT: 33.5 % — ABNORMAL LOW (ref 38.5–50.0)
Hemoglobin: 11.2 g/dL — ABNORMAL LOW (ref 13.2–17.1)
Lymphs Abs: 941 cells/uL (ref 850–3900)
MCH: 28.5 pg (ref 27.0–33.0)
MCHC: 33.4 g/dL (ref 32.0–36.0)
MCV: 85.2 fL (ref 80.0–100.0)
MPV: 11.2 fL (ref 7.5–12.5)
Monocytes Relative: 8 %
Neutro Abs: 9251 cells/uL — ABNORMAL HIGH (ref 1500–7800)
Neutrophils Relative %: 82.6 %
Platelets: 323 10*3/uL (ref 140–400)
RBC: 3.93 10*6/uL — ABNORMAL LOW (ref 4.20–5.80)
RDW: 13.8 % (ref 11.0–15.0)
Total Lymphocyte: 8.4 %
WBC: 11.2 10*3/uL — ABNORMAL HIGH (ref 3.8–10.8)

## 2019-11-11 LAB — COMPLETE METABOLIC PANEL WITH GFR
AG Ratio: 1.4 (calc) (ref 1.0–2.5)
ALT: 30 U/L (ref 9–46)
AST: 27 U/L (ref 10–35)
Albumin: 3.6 g/dL (ref 3.6–5.1)
Alkaline phosphatase (APISO): 106 U/L (ref 35–144)
BUN/Creatinine Ratio: 33 (calc) — ABNORMAL HIGH (ref 6–22)
BUN: 18 mg/dL (ref 7–25)
CO2: 25 mmol/L (ref 20–32)
Calcium: 8.6 mg/dL (ref 8.6–10.3)
Chloride: 102 mmol/L (ref 98–110)
Creat: 0.54 mg/dL — ABNORMAL LOW (ref 0.70–1.11)
GFR, Est African American: 114 mL/min/{1.73_m2} (ref >=60)
GFR, Est Non African American: 98 mL/min/{1.73_m2} (ref >=60)
Globulin: 2.5 g/dL (calc) (ref 1.9–3.7)
Glucose, Bld: 142 mg/dL — ABNORMAL HIGH (ref 65–99)
Potassium: 4.4 mmol/L (ref 3.5–5.3)
Sodium: 136 mmol/L (ref 135–146)
Total Bilirubin: 0.5 mg/dL (ref 0.2–1.2)
Total Protein: 6.1 g/dL (ref 6.1–8.1)

## 2019-11-11 LAB — LIPASE: Lipase: 18 U/L (ref 7–60)

## 2019-11-11 MED ORDER — ONDANSETRON HCL 4 MG PO TABS: 4.0000 mg | ORAL_TABLET | Freq: Three times a day (TID) | ORAL | 0 refills | Status: DC | PRN

## 2019-11-11 NOTE — Progress Notes (Signed)
Subjective:    Patient ID: Randy Thomas, male    DOB: [birth year], 84 y.o.   MRN: [id number]  HPI  09/05/19 Patient was admitted to the hospital on December 7 and a CT scan obtained at that time showed cholelithiasis:  IMPRESSION: 1. Umbilical hernia contains fat and omental vessels. Mild stranding in the hernia sac may represent inflammatory change, however this appears similar to prior exam. No bowel involvement. 2. Moderate aortic atherosclerosis without aortic aneurysm or acute aortic abnormality. No evidence of intra-abdominal mass. 3. Calcified gallstone in the gallbladder neck with mild gallbladder distension. Upper normal common bile duct for age at 8 mm, no visualized choledocholithiasis. 4. Colonic diverticulosis without diverticulitis. 5. Enlarged prostate gland.  It was thought that the patient may be developing early cholecystitis.  He was admitted to the hospital and kept n.p.o.  He was scheduled for a cholecystectomy.  However the patient had a panic attack on the way to the operating room and rescinded his consent for surgery.  As his symptoms had improved he was discharged from the hospital.  He recently had an outpatient follow-up with Dr. Arnoldo Morale.  Per the note, the patient was asymptomatic and at the present time no cholecystectomy was planned as an outpatient as the patient wishes to defer surgery unless symptoms return.  He is here today for follow-up.  Patient seems to be doing remarkably well.  He denies any further abdominal pain.  Patient states that his pain originally started suddenly directly below the xiphoid process and radiated down in the center of his abdomen to his umbilicus.  He described it as a sharp burning constant pain that was unbearable.  Since his hospitalization he has not experienced that at all.  He denies any fever.  He denies any jaundice.  He denies any nausea or vomiting or melena or hematochezia.  He has essentially been completely  asymptomatic.  At that time, my plan was: Abdominal exam today is completely benign.  Spent the entire office visit discussing signs and symptoms of cholecystitis.  Try to explain the difference between biliary colic and cholecystitis.  If the patient develops unrelenting pain, fever, jaundice, he is to go directly to the hospital.  Explained that he would have to have surgery at that point.  Otherwise as long as he is not experiencing pain, we can certainly simply monitor the situation.  Patient is comfortable with this plan.  11/11/19 Patient presents today with his daughter.  When I last saw the patient he was 160 pounds.  He is down to 149.  He has lost 11 pounds.  He has very little appetite.  His daughter states that he is eating and drinking very little.  He states that ever since his hospitalization he has been having diarrhea.  It has become extremely bad over the last 3 weeks.  He will go anywhere from 5-15 times a day.  Most times it is pure water.  He is only had one episode of nausea and vomiting.  However he does have very little appetite and persistent watery diarrhea.  He denies any gray or acholic stools.  He denies any fever or jaundice.  However he does appear pale and dehydrated.  His mucous membranes are dry.  His skin turgor is decreased.  His blood pressure is remarkably good given the weight loss that he has experienced.  His bowel sounds are normal.  They are not decreased and they are not increased.  There is no guarding.  There is no rebound.  There is no evidence of an acute abdomen.  There is no distention.   Past Medical History:  Diagnosis Date  . Allergy   . GERD (gastroesophageal reflux disease)   . Hyperlipidemia   . Hypertension   . Myocardial infarction [facility])    Past Surgical History:  Procedure Laterality Date  . [place] wore cast   Current Outpatient Medications on File Prior to Visit  Medication Sig Dispense Refill  . acetaminophen (TYLENOL  ARTHRITIS PAIN) 650 MG CR tablet Take 2 tablets (1,300 mg total) by mouth every 8 (eight) hours as needed. For pain 120 tablet 2  . aspirin EC 81 MG tablet Take 1 tablet (81 mg total) by mouth daily. 30 tablet 3  . cetirizine (ZYRTEC) 10 MG tablet Take 1 tablet (10 mg total) by mouth daily. 30 tablet 11  . cholecalciferol (VITAMIN D) 1000 units tablet Take 1 tablet (1,000 Units total) by mouth daily. 30 tablet 3  . HYDROcodone-acetaminophen (NORCO/VICODIN) 5-325 MG tablet Take 1 tablet by mouth every 6 (six) hours as needed. 15 tablet 0  . lansoprazole (PREVACID) 30 MG capsule Take 1 capsule (30 mg total) by mouth 2 (two) times daily. 60 capsule 3  . methocarbamol (ROBAXIN) 750 MG tablet Take 1 tablet (750 mg total) by mouth 4 (four) times daily. 60 tablet 2  . metoprolol succinate (TOPROL-XL) 50 MG 24 hr tablet TAKE 1/2 TABLET BY MOUTH IN THE MORNING AND 1/2 TABLET BY MOUTH IN THE EVENING 60 tablet 4  . Multiple Vitamin (MULTIVITAMIN WITH MINERALS) TABS tablet Take 1 tablet by mouth daily. 30 tablet 3  . omega-3 acid ethyl esters (LOVAZA) 1 g capsule TAKE 2 CAPSULES BY MOUTH TWICE DAILY 360 capsule 0  . ramipril (ALTACE) 2.5 MG capsule Take 1 capsule (2.5 mg total) by mouth daily. 90 capsule 1  . rosuvastatin (CRESTOR) 40 MG tablet Take 1 tablet (40 mg total) by mouth at bedtime. 90 tablet 0   No current facility-administered medications on file prior to visit.   No Known Allergies  Social History   Socioeconomic History  . Marital status: Married    Spouse name: Not on file  . Number of children: Not on file  . Years of education: Not on file  . Highest education level: Not on file  Occupational History  . Not on file  Tobacco Use  . Smoking status: Former Smoker    Quit date: 09/15/1981    Years since quitting: 38.1  . Smokeless tobacco: Never Used  Substance and Sexual Activity  . Alcohol use: No    Alcohol/week: 0.0 standard drinks  . Drug use: No  . Sexual activity: Not  Currently  Other Topics Concern  . Not on file  Social History Narrative  . Not on file   Social Determinants of Health   Financial Resource Strain:   . Difficulty of Paying Living Expenses: Not on file  Food Insecurity:   . Worried About Charity fundraiser in the Last Year: Not on file  . Ran Out of Food in the Last Year: Not on file  Transportation Needs:   . Lack of Transportation (Medical): Not on file  . Lack of Transportation (Non-Medical): Not on file  Physical Activity:   . Days of Exercise per Week: Not on file  . Minutes of Exercise per Session: Not on file  Stress:   . Feeling of Stress : Not on file  Social Connections:   . Frequency of Communication with Friends and Family: Not on file  . Frequency of Social Gatherings with Friends and Family: Not on file  . Attends Religious Services: Not on file  . Active Member of Clubs or Organizations: Not on file  . Attends Archivist Meetings: Not on file  . Marital Status: Not on file  Intimate Partner Violence:   . Fear of Current or Ex-Partner: Not on file  . Emotionally Abused: Not on file  . Physically Abused: Not on file  . Sexually Abused: Not on file    Review of Systems  All other systems reviewed and are negative.      Objective:   Physical Exam Vitals reviewed.  Constitutional:      Appearance: Normal appearance. He is normal weight. He is not ill-appearing or diaphoretic.  HENT:     Mouth/Throat:     Mouth: Mucous membranes are dry.  Cardiovascular:     Rate and Rhythm: Normal rate and regular rhythm.     Heart sounds: Normal heart sounds. No murmur. No friction rub. No gallop.   Pulmonary:     Effort: Pulmonary effort is normal. No respiratory distress.     Breath sounds: Normal breath sounds. No stridor. No wheezing, rhonchi or rales.  Abdominal:     General: Abdomen is flat. Bowel sounds are normal. There is no distension.     Palpations: Abdomen is soft.     Tenderness: There is  abdominal tenderness. There is no guarding or rebound.    Musculoskeletal:     Right lower leg: No edema.     Left lower leg: No edema.  Neurological:     Mental Status: He is alert.           Assessment & Plan:  Weight loss - Plan: CBC with Differential/Platelet, COMPLETE METABOLIC PANEL WITH GFR, Lipase  Diarrhea, unspecified type - Plan: C difficile Toxins A+B W/Rflx  Dehydration  An IV was started today in clinic and the patient was given 1 L of normal saline.  I will give the patient Zofran 4 mg every 8 hours as needed for nausea over the weekend and encourage him to drink Gatorade.  I have also recommended that we rule out C. difficile as a potential cause of his diarrhea given the fact it began after his hospitalization.  He is clearly dehydrated.  I recommended that they hold his Altace until he is eating better and his diarrhea has improved.  Check CBC, CMP, lipase.  If there are elevations in his liver function tests or lipase he would likely need to return to the hospital to remove his gallbladder or focus on comfort care and palliative care consult.  Await the results of his lab work as well as stool studies to rule out C. difficile.  I will avoid antidiarrheal medication until I have the results of the C. difficile studies to avoid toxic megacolon.

## 2019-11-14 ENCOUNTER — Ambulatory Visit (INDEPENDENT_AMBULATORY_CARE_PROVIDER_SITE_OTHER): Payer: Medicare HMO | Admitting: Family Medicine

## 2019-11-14 ENCOUNTER — Other Ambulatory Visit: Payer: Self-pay

## 2019-11-14 ENCOUNTER — Encounter: Payer: Self-pay | Admitting: Family Medicine

## 2019-11-14 VITALS — BP 124/60 | HR 76 | Temp 96.4°F | Resp 16 | Ht 66.0 in | Wt 148.5 lb

## 2019-11-14 DIAGNOSIS — R634 Abnormal weight loss: Secondary | ICD-10-CM

## 2019-11-14 DIAGNOSIS — E860 Dehydration: Secondary | ICD-10-CM

## 2019-11-14 DIAGNOSIS — R197 Diarrhea, unspecified: Secondary | ICD-10-CM | POA: Diagnosis not present

## 2019-11-14 MED ORDER — METRONIDAZOLE 500 MG PO TABS: 500.0000 mg | ORAL_TABLET | Freq: Two times a day (BID) | ORAL | 0 refills | Status: DC

## 2019-11-14 NOTE — Progress Notes (Signed)
Subjective:    Patient ID: Randy Thomas, male    DOB: December 27, 1935, 84 y.o.   MRN: [id number]  HPI  09/05/19 Patient was admitted to the hospital on December 7 and a CT scan obtained at that time showed cholelithiasis:  IMPRESSION: 1. Umbilical hernia contains fat and omental vessels. Mild stranding in the hernia sac may represent inflammatory change, however this appears similar to prior exam. No bowel involvement. 2. Moderate aortic atherosclerosis without aortic aneurysm or acute aortic abnormality. No evidence of intra-abdominal mass. 3. Calcified gallstone in the gallbladder neck with mild gallbladder distension. Upper normal common bile duct for age at 8 mm, no visualized choledocholithiasis. 4. Colonic diverticulosis without diverticulitis. 5. Enlarged prostate gland.  It was thought that the patient may be developing early cholecystitis.  He was admitted to the hospital and kept n.p.o.  He was scheduled for a cholecystectomy.  However the patient had a panic attack on the way to the operating room and rescinded his consent for surgery.  As his symptoms had improved he was discharged from the hospital.  He recently had an outpatient follow-up with Dr. Arnoldo Morale.  Per the note, the patient was asymptomatic and at the present time no cholecystectomy was planned as an outpatient as the patient wishes to defer surgery unless symptoms return.  He is here today for follow-up.  Patient seems to be doing remarkably well.  He denies any further abdominal pain.  Patient states that his pain originally started suddenly directly below the xiphoid process and radiated down in the center of his abdomen to his umbilicus.  He described it as a sharp burning constant pain that was unbearable.  Since his hospitalization he has not experienced that at all.  He denies any fever.  He denies any jaundice.  He denies any nausea or vomiting or melena or hematochezia.  He has essentially been completely  asymptomatic.  At that time, my plan was: Abdominal exam today is completely benign.  Spent the entire office visit discussing signs and symptoms of cholecystitis.  Try to explain the difference between biliary colic and cholecystitis.  If the patient develops unrelenting pain, fever, jaundice, he is to go directly to the hospital.  Explained that he would have to have surgery at that point.  Otherwise as long as he is not experiencing pain, we can certainly simply monitor the situation.  Patient is comfortable with this plan.  11/11/19 Patient presents today with his daughter.  When I last saw the patient he was 160 pounds.  He is down to 149.  He has lost 11 pounds.  He has very little appetite.  His daughter states that he is eating and drinking very little.  He states that ever since his hospitalization he has been having diarrhea.  It has become extremely bad over the last 3 weeks.  He will go anywhere from 5-15 times a day.  Most times it is pure water.  He is only had one episode of nausea and vomiting.  However he does have very little appetite and persistent watery diarrhea.  He denies any gray or acholic stools.  He denies any fever or jaundice.  However he does appear pale and dehydrated.  His mucous membranes are dry.  His skin turgor is decreased.  His blood pressure is remarkably good given the weight loss that he has experienced.  His bowel sounds are normal.  They are not decreased and they are not increased.  There is no guarding.  There is no rebound.  There is no evidence of an acute abdomen.  There is no distention.  At that time, my plan was: An IV was started today in clinic and the patient was given 1 L of normal saline.  I will give the patient Zofran 4 mg every 8 hours as needed for nausea over the weekend and encourage him to drink Gatorade.  I have also recommended that we rule out C. difficile as a potential cause of his diarrhea given the fact it began after his hospitalization.  He  is clearly dehydrated.  I recommended that they hold his Altace until he is eating better and his diarrhea has improved.  Check CBC, CMP, lipase.  If there are elevations in his liver function tests or lipase he would likely need to return to the hospital to remove his gallbladder or focus on comfort care and palliative care consult.  Await the results of his lab work as well as stool studies to rule out C. difficile.  I will avoid antidiarrheal medication until I have the results of the C. difficile studies to avoid toxic megacolon.  11/14/19 Patient continues to have diarrhea.  He denies any abdominal pain.  His abdomen is soft nondistended and nontender today although he does have hyperactive bowel sounds.  He denies any melena or hematochezia but continues to have watery stools.  He denies any fevers or chills however he does not appear dehydrated today.  His mucous membranes are moist.  Lab work was significant only for a mildly elevated white blood cell count but but otherwise liver function tests were normal.  There was no evidence of biliary tract obstruction based on lab work and his exam certainly looks normal today with no evidence of an acute abdomen.  Diarrhea began after his hospitalization making me concerned about possible C. difficile colitis.  Past Medical History:  Diagnosis Date  . Allergy   . GERD (gastroesophageal reflux disease)   . Hyperlipidemia   . Hypertension   . Myocardial infarction [facility])    Past Surgical History:  Procedure Laterality Date  . Seffner wore cast   Current Outpatient Medications on File Prior to Visit  Medication Sig Dispense Refill  . acetaminophen (TYLENOL ARTHRITIS PAIN) 650 MG CR tablet Take 2 tablets (1,300 mg total) by mouth every 8 (eight) hours as needed. For pain 120 tablet 2  . aspirin EC 81 MG tablet Take 1 tablet (81 mg total) by mouth daily. 30 tablet 3  . cetirizine (ZYRTEC) 10 MG tablet Take 1 tablet (10 mg total) by  mouth daily. 30 tablet 11  . cholecalciferol (VITAMIN D) 1000 units tablet Take 1 tablet (1,000 Units total) by mouth daily. 30 tablet 3  . HYDROcodone-acetaminophen (NORCO/VICODIN) 5-325 MG tablet Take 1 tablet by mouth every 6 (six) hours as needed. 15 tablet 0  . lansoprazole (PREVACID) 30 MG capsule Take 1 capsule (30 mg total) by mouth 2 (two) times daily. 60 capsule 3  . methocarbamol (ROBAXIN) 750 MG tablet Take 1 tablet (750 mg total) by mouth 4 (four) times daily. 60 tablet 2  . metoprolol succinate (TOPROL-XL) 50 MG 24 hr tablet TAKE 1/2 TABLET BY MOUTH IN THE MORNING AND 1/2 TABLET BY MOUTH IN THE EVENING 60 tablet 4  . Multiple Vitamin (MULTIVITAMIN WITH MINERALS) TABS tablet Take 1 tablet by mouth daily. 30 tablet 3  . omega-3 acid ethyl esters (LOVAZA) 1 g capsule TAKE 2 CAPSULES BY  MOUTH TWICE DAILY 360 capsule 0  . ondansetron (ZOFRAN) 4 MG tablet Take 1 tablet (4 mg total) by mouth every 8 (eight) hours as needed for nausea or vomiting. 20 tablet 0  . ramipril (ALTACE) 2.5 MG capsule Take 1 capsule (2.5 mg total) by mouth daily. 90 capsule 1  . rosuvastatin (CRESTOR) 40 MG tablet Take 1 tablet (40 mg total) by mouth at bedtime. 90 tablet 0   No current facility-administered medications on file prior to visit.   No Known Allergies  Social History   Socioeconomic History  . Marital status: Married    Spouse name: Not on file  . Number of children: Not on file  . Years of education: Not on file  . Highest education level: Not on file  Occupational History  . Not on file  Tobacco Use  . Smoking status: Former Smoker    Quit date: 09/15/1981    Years since quitting: 38.1  . Smokeless tobacco: Never Used  Substance and Sexual Activity  . Alcohol use: No    Alcohol/week: 0.0 standard drinks  . Drug use: No  . Sexual activity: Not Currently  Other Topics Concern  . Not on file  Social History Narrative  . Not on file   Social Determinants of Health   Financial  Resource Strain:   . Difficulty of Paying Living Expenses: Not on file  Food Insecurity:   . Worried About Charity fundraiser in the Last Year: Not on file  . Ran Out of Food in the Last Year: Not on file  Transportation Needs:   . Lack of Transportation (Medical): Not on file  . Lack of Transportation (Non-Medical): Not on file  Physical Activity:   . Days of Exercise per Week: Not on file  . Minutes of Exercise per Session: Not on file  Stress:   . Feeling of Stress : Not on file  Social Connections:   . Frequency of Communication with Friends and Family: Not on file  . Frequency of Social Gatherings with Friends and Family: Not on file  . Attends Religious Services: Not on file  . Active Member of Clubs or Organizations: Not on file  . Attends Archivist Meetings: Not on file  . Marital Status: Not on file  Intimate Partner Violence:   . Fear of Current or Ex-Partner: Not on file  . Emotionally Abused: Not on file  . Physically Abused: Not on file  . Sexually Abused: Not on file    Review of Systems  All other systems reviewed and are negative.      Objective:   Physical Exam Vitals reviewed.  Constitutional:      Appearance: Normal appearance. He is normal weight. He is not ill-appearing or diaphoretic.  HENT:     Mouth/Throat:     Mouth: Mucous membranes are moist.  Cardiovascular:     Rate and Rhythm: Normal rate and regular rhythm.     Heart sounds: Normal heart sounds. No murmur. No friction rub. No gallop.   Pulmonary:     Effort: Pulmonary effort is normal. No respiratory distress.     Breath sounds: Normal breath sounds. No stridor. No wheezing, rhonchi or rales.  Abdominal:     General: Abdomen is flat. Bowel sounds are normal. There is no distension.     Palpations: Abdomen is soft.     Tenderness: There is no abdominal tenderness. There is no guarding or rebound.  Musculoskeletal:  Right lower leg: No edema.     Left lower leg: No edema.   Neurological:     Mental Status: He is alert.           Assessment & Plan:  Weight loss - Plan: C. difficile GDH and Toxin A/B  Diarrhea, unspecified type - Plan: C. difficile GDH and Toxin A/B  Dehydration - Plan: C. difficile GDH and Toxin A/B  I believe the patient likely has chronic colitis either due to C. difficile or some type of inflammatory bowel disease such as lymphocytic colitis.  I will treat the patient empirically with Flagyl 500 mg p.o. twice daily until stool test have returned.  Stool test was obtained prior to the patient starting the antibiotics.  If stool test is negative for C. difficile colitis, I would likely consult GI to evaluate for possible inflammatory bowel disease meanwhile I will start the patient on Lomotil.  However I will await the results of his stool study prior to starting the patient on Lomotil

## 2019-11-15 ENCOUNTER — Other Ambulatory Visit: Payer: Self-pay | Admitting: Family Medicine

## 2019-11-15 LAB — C. DIFFICILE GDH AND TOXIN A/B
GDH ANTIGEN: NOT DETECTED
MICRO NUMBER:: 10199626
SPECIMEN QUALITY:: ADEQUATE
TOXIN A AND B: NOT DETECTED

## 2019-11-15 MED ORDER — DIPHENOXYLATE-ATROPINE 2.5-0.025 MG PO TABS: 1.0000 | ORAL_TABLET | Freq: Four times a day (QID) | ORAL | 0 refills | Status: DC | PRN

## 2019-11-16 ENCOUNTER — Other Ambulatory Visit: Payer: Self-pay | Admitting: Family Medicine

## 2019-11-16 DIAGNOSIS — R197 Diarrhea, unspecified: Secondary | ICD-10-CM

## 2019-11-16 DIAGNOSIS — R634 Abnormal weight loss: Secondary | ICD-10-CM

## 2019-11-17 ENCOUNTER — Encounter: Payer: Self-pay | Admitting: Internal Medicine

## 2019-11-30 ENCOUNTER — Ambulatory Visit (INDEPENDENT_AMBULATORY_CARE_PROVIDER_SITE_OTHER): Payer: Medicare HMO | Admitting: Family Medicine

## 2019-11-30 ENCOUNTER — Other Ambulatory Visit: Payer: Self-pay

## 2019-11-30 ENCOUNTER — Telehealth: Payer: Self-pay | Admitting: Family Medicine

## 2019-11-30 ENCOUNTER — Encounter: Payer: Self-pay | Admitting: Family Medicine

## 2019-11-30 VITALS — BP 122/66 | HR 62 | Temp 97.9°F | Resp 14 | Ht 66.0 in | Wt 146.0 lb

## 2019-11-30 DIAGNOSIS — A09 Infectious gastroenteritis and colitis, unspecified: Secondary | ICD-10-CM

## 2019-11-30 DIAGNOSIS — K529 Noninfective gastroenteritis and colitis, unspecified: Secondary | ICD-10-CM

## 2019-11-30 DIAGNOSIS — R634 Abnormal weight loss: Secondary | ICD-10-CM | POA: Diagnosis not present

## 2019-11-30 MED ORDER — DICYCLOMINE HCL 10 MG PO CAPS: 10.0000 mg | ORAL_CAPSULE | Freq: Three times a day (TID) | ORAL | 0 refills | Status: DC | PRN

## 2019-11-30 NOTE — Patient Instructions (Addendum)
CT scan to be done Take the bentyl twice a day  We will call with lab results  Take the bentyl with food Do not take at the same time as the lomitil, to see if the bentyl kicks in first  F/U pending results

## 2019-11-30 NOTE — Telephone Encounter (Signed)
Spoke with pt daughter Having severe diarrhea and is losing weight.  She seems to be eating okay denied any particular pain but the Lomotil as well as Imodium together not helping him.   History appointment is not for another 2 weeks.  I reviewed his history he has Calculus of the gallbladder long with cholecystitis this was supposed to be removed but then he had anxiety well on the table so this was not done.  He has basically had diarrhea for over a month with continued weight loss.  He has C. difficile studies were negative.  We will bring him into the office to be evaluated in person if that cannot make a reasonable decision on the next steps over the phone

## 2019-11-30 NOTE — Progress Notes (Signed)
Subjective:    Patient ID: Randy Thomas, male    DOB: 1936-01-12, 84 y.o.   MRN: [id number]  Patient presents for Diarrhea (watery stools, weight loss- GI appt 3/29) Patient here with his daughter.  I reviewed the notes from his PCP.  He has been having diarrhea for greater than 1 month along with weight loss.  C. difficile studies have been negative.  Labs have been fairly unremarkable.  He has been on Lomotil as well as Imodium the past couple weeks with minimal improvement.  They are concerned about him being dehydrated.  He was given IV fluids a few weeks ago in the office due to weakness.  He does have gallbladder disease this was supposed to be removed but then he had an anxiety attack and did not want to have the procedure done back in December.  Since he has not had any significant abdominal pain associated with the gallbladder surgeon is decided to hold off on removing this.  As no other source for the diarrhea has been found thus far PCP is set him up to see gastroenterology but that is not until March 29.  Weight 148 pounds on March 2 today 146 pounds.  He states that he does not have any pain or discomfort with eating.  He will get occasional cramping right after bowel movement.  His appetite has been up and down sometimes he eats fair other times eats very little.  He has not had any vomiting.  He has been trying to drink to keep himself hydrated states he is urinating normally.  Meds reviewed he is taking his hypertensive medications as prescribed he is also on PPI Prevacid and Crestor for hyperlipidemia.  I reviewed his CT scan from December  IMPRESSION: 1. Umbilical hernia contains fat and omental vessels. Mild stranding in the hernia sac may represent inflammatory change, however this appears similar to prior exam. No bowel involvement. 2. Moderate aortic atherosclerosis without aortic aneurysm or acute aortic abnormality. No evidence of intra-abdominal mass. 3. Calcified  gallstone in the gallbladder neck with mild gallbladder distension. Upper normal common bile duct for age at 8 mm, no visualized choledocholithiasis. 4. Colonic diverticulosis without diverticulitis. 5. Enlarged prostate gland.  Aortic Atherosclerosis (ICD10-I70.0).   Review Of Systems:  GEN- denies fatigue, fever, weight loss,weakness, recent illness HEENT- denies eye drainage, change in vision, nasal discharge, CVS- denies chest pain, palpitations RESP- denies SOB, cough, wheeze ABD- denies N/V, change in stools, abd pain GU- denies dysuria, hematuria, dribbling, incontinence MSK- denies joint pain, muscle aches, injury Neuro- denies headache, dizziness, syncope, seizure activity       Objective:    BP 122/66   Pulse 62   Temp 97.9 F (36.6 C) (Temporal)   Resp 14   Ht 5\' 6"  (1.676 m)   Wt 146 lb (66.2 kg)   SpO2 97%   BMI 23.57 kg/m  GEN- NAD, alert and oriented x3, weight loss, nontoxic-appearing HEENT- PERRL, EOMI, non injected sclera, pink conjunctiva, MMM, oropharynx clear Neck- Supple,  CVS- RRR, no murmur RESP-CTAB ABD-NABS,soft,NT,ND EXT- No edema Pulses- Radial, 2+        Assessment & Plan:      Problem List Items Addressed This Visit    None    Visit Diagnoses    Chronic diarrhea    -  Primary   Chronic diarrhea with unintentional weight loss.  In the past 3 months he has lost 15 pounds.  There was no cancer seen on  his CT back in December but his symptoms have progressed.  He is not have any pain on abdominal exam but I am concerned that we may have an underlying colitis or inflammatory bowel disorder.  Will obtain a CT of abdomen and pelvis.  We will repeat his labs as he did have elevated white blood cell count a few weeks ago.  Continue to push the fluids.  As his C. difficile was negative Georgina Peer try him on Bentyl with meals and that he can still use the Lomotil as needed for diarrhea if that does not work.  We will discontinue the Imodium.   Pending his CT scan and labs we will call gastroenterology for more urgent appointment we cannot pinpoint anything on the imaging.  He will likely need colonoscopy and biopsy  Patient's daughter with him today   Relevant Orders   CBC with Differential/Platelet   Comprehensive metabolic panel   Lipase   CT Abdomen Pelvis Wo Contrast   Unintentional weight loss       Relevant Orders   CBC with Differential/Platelet   Comprehensive metabolic panel   Lipase   CT Abdomen Pelvis Wo Contrast   Infectious gastroenteritis and colitis       Relevant Orders   CT Abdomen Pelvis Wo Contrast      Note: This dictation was prepared with Dragon dictation along with smaller phrase technology. Any transcriptional errors that result from this process are unintentional.

## 2019-11-30 NOTE — Telephone Encounter (Signed)
Pt's daughter called and states that the patient is still having severe diarrhea. He is taking Lomotil 1 tab po qid and Imodium after each BM. She is afraid he is getting dehydrated again however his wt while on 144 lbs. He has had 3 BM's this morning and will not leave house d/t the diarrhea.  He has an apt at GI in 2 weeks.   Please call dtr Maxine at [phone]

## 2019-12-01 ENCOUNTER — Ambulatory Visit (HOSPITAL_COMMUNITY)
Admission: RE | Admit: 2019-12-01 | Discharge: 2019-12-01 | Disposition: A | Discharge status: Home / Self Care | Payer: Medicare HMO | Source: Ambulatory Visit | Attending: Family Medicine | Admitting: Family Medicine

## 2019-12-01 DIAGNOSIS — K8020 Calculus of gallbladder without cholecystitis without obstruction: Secondary | ICD-10-CM | POA: Diagnosis not present

## 2019-12-01 DIAGNOSIS — R634 Abnormal weight loss: Secondary | ICD-10-CM | POA: Diagnosis not present

## 2019-12-01 DIAGNOSIS — K529 Noninfective gastroenteritis and colitis, unspecified: Secondary | ICD-10-CM | POA: Diagnosis not present

## 2019-12-01 DIAGNOSIS — A09 Infectious gastroenteritis and colitis, unspecified: Secondary | ICD-10-CM | POA: Diagnosis not present

## 2019-12-01 DIAGNOSIS — K828 Other specified diseases of gallbladder: Secondary | ICD-10-CM | POA: Diagnosis not present

## 2019-12-01 LAB — CBC WITH DIFFERENTIAL/PLATELET
Absolute Monocytes: 667 cells/uL (ref 200–950)
Basophils Absolute: 48 cells/uL (ref 0–200)
Basophils Relative: 1 %
Eosinophils Absolute: 110 cells/uL (ref 15–500)
Eosinophils Relative: 2.3 %
HCT: 36.3 % — ABNORMAL LOW (ref 38.5–50.0)
Hemoglobin: 11.8 g/dL — ABNORMAL LOW (ref 13.2–17.1)
Lymphs Abs: 1181 cells/uL (ref 850–3900)
MCH: 28.4 pg (ref 27.0–33.0)
MCHC: 32.5 g/dL (ref 32.0–36.0)
MCV: 87.3 fL (ref 80.0–100.0)
MPV: 11.1 fL (ref 7.5–12.5)
Monocytes Relative: 13.9 %
Neutro Abs: 2794 cells/uL (ref 1500–7800)
Neutrophils Relative %: 58.2 %
Platelets: 195 10*3/uL (ref 140–400)
RBC: 4.16 10*6/uL — ABNORMAL LOW (ref 4.20–5.80)
RDW: 14.7 % (ref 11.0–15.0)
Total Lymphocyte: 24.6 %
WBC: 4.8 10*3/uL (ref 3.8–10.8)

## 2019-12-01 LAB — COMPREHENSIVE METABOLIC PANEL
AG Ratio: 1.9 (calc) (ref 1.0–2.5)
ALT: 18 U/L (ref 9–46)
AST: 21 U/L (ref 10–35)
Albumin: 3.8 g/dL (ref 3.6–5.1)
Alkaline phosphatase (APISO): 90 U/L (ref 35–144)
BUN/Creatinine Ratio: 21 (calc) (ref 6–22)
BUN: 13 mg/dL (ref 7–25)
CO2: 29 mmol/L (ref 20–32)
Calcium: 8.7 mg/dL (ref 8.6–10.3)
Chloride: 104 mmol/L (ref 98–110)
Creat: 0.63 mg/dL — ABNORMAL LOW (ref 0.70–1.11)
Globulin: 2 g/dL (calc) (ref 1.9–3.7)
Glucose, Bld: 112 mg/dL — ABNORMAL HIGH (ref 65–99)
Potassium: 4.6 mmol/L (ref 3.5–5.3)
Sodium: 140 mmol/L (ref 135–146)
Total Bilirubin: 0.8 mg/dL (ref 0.2–1.2)
Total Protein: 5.8 g/dL — ABNORMAL LOW (ref 6.1–8.1)

## 2019-12-01 LAB — LIPASE: Lipase: 17 U/L (ref 7–60)

## 2019-12-05 ENCOUNTER — Other Ambulatory Visit: Payer: Self-pay | Admitting: Family Medicine

## 2019-12-05 DIAGNOSIS — K8020 Calculus of gallbladder without cholecystitis without obstruction: Secondary | ICD-10-CM

## 2019-12-06 ENCOUNTER — Encounter: Payer: Self-pay | Admitting: General Surgery

## 2019-12-06 ENCOUNTER — Ambulatory Visit: Payer: Medicare HMO | Admitting: General Surgery

## 2019-12-06 ENCOUNTER — Other Ambulatory Visit: Payer: Self-pay

## 2019-12-06 VITALS — BP 148/57 | HR 68 | Temp 97.6°F | Resp 16 | Ht 68.0 in | Wt 147.0 lb

## 2019-12-06 DIAGNOSIS — K8020 Calculus of gallbladder without cholecystitis without obstruction: Secondary | ICD-10-CM | POA: Diagnosis not present

## 2019-12-06 NOTE — Patient Instructions (Signed)
Cholelithiasis  Cholelithiasis is a form of gallbladder disease in which gallstones form in the gallbladder. The gallbladder is an organ that stores bile. Bile is made in the liver, and it helps to digest fats. Gallstones begin as small crystals and slowly grow into stones. They may cause no symptoms until the gallbladder tightens (contracts) and a gallstone is blocking the duct (gallbladder attack), which can cause pain. Cholelithiasis is also referred to as gallstones. There are two main types of gallstones:  Cholesterol stones. These are made of hardened cholesterol and are usually yellow-green in color. They are the most common type of gallstone. Cholesterol is a white, waxy, fat-like substance that is made in the liver.  Pigment stones. These are dark in color and are made of a red-yellow substance that forms when hemoglobin from red blood cells breaks down (bilirubin). What are the causes? This condition may be caused by an imbalance in the substances that bile is made of. This can happen if the bile:  Has too much bilirubin.  Has too much cholesterol.  Does not have enough bile salts. These salts help the body absorb and digest fats. In some cases, this condition can also be caused by the gallbladder not emptying completely or often enough. What increases the risk? The following factors may make you more likely to develop this condition:  Being male.  Having multiple pregnancies. Health care providers sometimes advise removing diseased gallbladders before future pregnancies.  Eating a diet that is heavy in fried foods, fat, and refined carbohydrates, like white bread and white rice.  Being obese.  Being older than age 40.  Prolonged use of medicines that contain male hormones (estrogen).  Having diabetes mellitus.  Rapidly losing weight.  Having a family history of gallstones.  Being of American Indian or Mexican descent.  Having an intestinal disease such as Crohn  disease.  Having metabolic syndrome.  Having cirrhosis.  Having severe types of anemia such as sickle cell anemia. What are the signs or symptoms? In most cases, there are no symptoms. These are known as silent gallstones. If a gallstone blocks the bile ducts, it can cause a gallbladder attack. The main symptom of a gallbladder attack is sudden pain in the upper right abdomen. The pain usually comes at night or after eating a large meal. The pain can last for one or several hours and can spread to the right shoulder or chest. If the bile duct is blocked for more than a few hours, it can cause infection or inflammation of the gallbladder, liver, or pancreas, which may cause:  Nausea.  Vomiting.  Abdominal pain that lasts for 5 hours or more.  Fever or chills.  Yellowing of the skin or the whites of the eyes (jaundice).  Dark urine.  Light-colored stools. How is this diagnosed? This condition may be diagnosed based on:  A physical exam.  Your medical history.  An ultrasound of your gallbladder.  CT scan.  MRI.  Blood tests to check for signs of infection or inflammation.  A scan of your gallbladder and bile ducts (biliary system) using nonharmful radioactive material and special cameras that can see the radioactive material (cholescintigram). This test checks to see how your gallbladder contracts and whether bile ducts are blocked.  Inserting a small tube with a camera on the end (endoscope) through your mouth to inspect bile ducts and check for blockages (endoscopic retrograde cholangiopancreatogram). How is this treated? Treatment for gallstones depends on the severity of the condition.   Silent gallstones do not need treatment. If the gallstones cause a gallbladder attack or other symptoms, treatment may be required. Options for treatment include:  Surgery to remove the gallbladder (cholecystectomy). This is the most common treatment.  Medicines to dissolve gallstones.  These are most effective at treating small gallstones. You may need to take medicines for up to 6-12 months.  Shock wave treatment (extracorporeal biliary lithotripsy). In this treatment, an ultrasound machine sends shock waves to the gallbladder to break gallstones into smaller pieces. These pieces can then be passed into the intestines or be dissolved by medicine. This is rarely used.  Removing gallstones through endoscopic retrograde cholangiopancreatogram. A small basket can be attached to the endoscope and used to capture and remove gallstones. Follow these instructions at home:  Take over-the-counter and prescription medicines only as told by your health care provider.  Maintain a healthy weight and follow a healthy diet. This includes: ? Reducing fatty foods, such as fried food. ? Reducing refined carbohydrates, like white bread and white rice. ? Increasing fiber. Aim for foods like almonds, fruit, and beans.  Keep all follow-up visits as told by your health care provider. This is important. Contact a health care provider if:  You think you have had a gallbladder attack.  You have been diagnosed with silent gallstones and you develop abdominal pain or indigestion. Get help right away if:  You have pain from a gallbladder attack that lasts for more than 2 hours.  You have abdominal pain that lasts for more than 5 hours.  You have a fever or chills.  You have persistent nausea and vomiting.  You develop jaundice.  You have dark urine or light-colored stools. Summary  Cholelithiasis (also called gallstones) is a form of gallbladder disease in which gallstones form in the gallbladder.  This condition is caused by an imbalance in the substances that make up bile. This can happen if the bile has too much cholesterol, too much bilirubin, or not enough bile salts.  You are more likely to develop this condition if you are male, pregnant, using medicines with estrogen, obese,  older than age 40, or have a family history of gallstones. You may also develop gallstones if you have diabetes, an intestinal disease, cirrhosis, or metabolic syndrome.  Treatment for gallstones depends on the severity of the condition. Silent gallstones do not need treatment.  If gallstones cause a gallbladder attack or other symptoms, treatment may be needed. The most common treatment is surgery to remove the gallbladder. This information is not intended to replace advice given to you by your health care provider. Make sure you discuss any questions you have with your health care provider. Document Revised: 08/14/2017 Document Reviewed: 05/18/2016 Elsevier Patient Education  2020 Elsevier Inc. Biliary Colic, Adult  Biliary colic is severe pain caused by a problem with a small organ in the upper right part of your belly (gallbladder). The gallbladder stores a digestive fluid produced in the liver (bile) that helps the body break down fat. Bile and other digestive enzymes are carried from the liver to the small intestine through tube-like structures (bile ducts). The gallbladder and the bile ducts form the biliary tract. Sometimes hard deposits of digestive fluids form in the gallbladder (gallstones) and block the flow of bile from the gallbladder, causing biliary colic. This condition is also called a gallbladder attack. Gallstones can be as small as a grain of sand or as big as a golf ball. There could be just one gallstone   in the gallbladder, or there could be many. What are the causes? Biliary colic is usually caused by gallstones. Less often, a tumor could block the flow of bile from the gallbladder and trigger biliary colic. What increases the risk? This condition is more likely to develop in:  Women.  People of Hispanic descent.  People with a family history of gallstones.  People who are obese.  People who suddenly or quickly lose weight.  People who eat a high-calorie, low-fiber  diet that is rich in refined carbs (carbohydrates), such as white bread and white rice.  People who have an intestinal disease that affects nutrient absorption, such as Crohn disease.  People who have a metabolic condition, such as metabolic syndrome or diabetes. What are the signs or symptoms? Severe pain in the upper right side of the belly is the main symptom of biliary colic. You may feel this pain below the chest but above the hip. This pain often occurs at night or after eating a very fatty meal. This pain may get worse for up to an hour and last as long as 12 hours. In most cases, the pain fades (subsides) within a couple hours. Other symptoms of this condition include:  Nausea and vomiting.  Pain under the right shoulder. How is this diagnosed? This condition is diagnosed based on your medical history, your symptoms, and a physical exam. You may have tests, including:  Blood tests to rule out infection or inflammation of the bile ducts, gallbladder, pancreas, or liver.  Imaging studies such as: ? Ultrasound. ? CT scan. ? MRI. In some cases, you may need to have an imaging study done using a small amount of radioactive material (nuclear medicine) to confirm the diagnosis. How is this treated? Treatment for this condition may include medicine to relieve your pain or nausea. If you have gallstones that are causing biliary colic, you may need surgery to remove the gallbladder (cholecystectomy). Gallstones can also be dissolved gradually with medicine. It may take months or years before the gallstones are completely gone. Follow these instructions at home:  Take over-the-counter and prescription medicines only as told by your health care provider.  Drink enough fluid to keep your urine clear or pale yellow.  Follow instructions from your health care provider about eating or drinking restrictions. These may include avoiding: ? Fatty, greasy, and fried foods. ? Any foods that make  the pain worse. ? Overeating. ? Having a large meal after not eating for a while.  Keep all follow-up visits as told by your health care provider. This is important. How is this prevented? Steps to prevent this condition include:  Maintaining a healthy body weight.  Getting regular exercise.  Eating a healthy, high-fiber, low-fat diet.  Limiting how much sugar and refined carbs you eat, such as sweets, white flour, and white rice. Contact a health care provider if:  Your pain lasts more than 5 hours.  You vomit.  You have a fever and chills.  Your pain gets worse. Get help right away if:  Your skin or the whites of your eyes look yellow (jaundice).  Your have tea-colored urine and light-colored stools.  You are dizzy or you faint. Summary  Biliary colic is severe pain caused by a problem with a small organ in the upper right part of your belly (gallbladder).  Treatments for this condition include medicines that relieves your pain or nausea and medicines that slowly dissolves the gallstones.  If gallstones cause your biliary colic,   the treatment is surgery to remove the gallbladder (cholecystectomy). This information is not intended to replace advice given to you by your health care provider. Make sure you discuss any questions you have with your health care provider. Document Revised: 08/14/2017 Document Reviewed: 03/17/2016 Elsevier Patient Education  2020 Elsevier Inc.  

## 2019-12-07 ENCOUNTER — Encounter: Payer: Self-pay | Admitting: Gastroenterology

## 2019-12-07 ENCOUNTER — Ambulatory Visit: Payer: Medicare HMO | Admitting: Gastroenterology

## 2019-12-07 DIAGNOSIS — R634 Abnormal weight loss: Secondary | ICD-10-CM

## 2019-12-07 DIAGNOSIS — R197 Diarrhea, unspecified: Secondary | ICD-10-CM | POA: Diagnosis not present

## 2019-12-07 MED ORDER — DICYCLOMINE HCL 10 MG PO CAPS: 10.0000 mg | ORAL_CAPSULE | Freq: Three times a day (TID) | ORAL | 3 refills | Status: DC

## 2019-12-07 NOTE — Progress Notes (Signed)
Subjective:     Randy Thomas  Patient presents back for follow-up of cholelithiasis.  Patient has been seen by myself in the past.  He initially had an acute episode of biliary colic and at the time of surgery, refused surgical intervention while being placed on the operating room table.  Since that time, he has had a decreased appetite but states he does not feel that hungry.  He denies any abdominal pain, fever, chills, or jaundice.  He denies any nausea or vomiting.  He currently has intermittent diarrhea.  He is seeing Dr. Laural Golden of gastroenterology on 12/07/2019 for further evaluation and treatment of the diarrhea. Objective:    BP (!) 148/57   Pulse 68   Temp 97.6 F (36.4 C) (Oral)   Resp 16   Ht 5\' 8"  (1.727 m)   Wt 147 lb (66.7 kg)   SpO2 95%   BMI 22.35 kg/m   General:  alert, cooperative and no distress  Head is normocephalic, atraumatic Lungs clear to auscultation with equal breath sounds bilaterally Heart examination reveals a regular rate and rhythm without S3, S4, murmurs Abdomen is soft, flat, nontender, nondistended.  CT scan images personally reviewed.  Patient does have cholelithiasis with a thickened gallbladder wall. Normal LFTs noted.  White blood cell count within normal limits.     Assessment:    Cholelithiasis, currently asymptomatic.  Does have abnormal findings on CT scan, but clinically does not have biliary colic.    Plan:  At this point, I do not feel the patient requires cholecystectomy.  He is for the most part asymptomatic.  He does have diarrhea of unknown etiology.  This will be addressed by Dr. Laural Golden.  Patient does not want surgical intervention unless absolutely necessary.  Patient will follow up with me as needed.

## 2019-12-07 NOTE — Patient Instructions (Signed)
You can take dicyclomine (Bentyl) three times a day before meals. Stop taking if you get constipated. This medication can cause drowsiness, dizziness, and constipation.  I recommend a colonoscopy. Please call if you would like to pursue this!  We will see you in 6-8 weeks!  It was a pleasure to see you today. I want to create trusting relationships with patients to provide genuine, compassionate, and quality care. I value your feedback. If you receive a survey regarding your visit,  I greatly appreciate you taking time to fill this out.   Annitta Needs, PhD, ANP-BC [facility]

## 2019-12-07 NOTE — Progress Notes (Signed)
Primary Care Physician:  Susy Frizzle, MD  Referring Physician: Dr. Dennard Schaumann Primary Gastroenterologist:  Dr. Gala Romney   Chief Complaint  Patient presents with  . Diarrhea    Bentyl and Lomotil hasn't helped  . Weight Loss    had weighed 160s    HPI:   Randy Thomas is an 84 y.o. male presenting today at the request of Dr. Dennard Schaumann due to weight loss and diarrhea. Cdiff toxin and antigen recently negative. Chronic mild normocytic anemia noted. CT abd/pelvis with contrast Dec 2020 with patent mesenteric vessels, umbilical hernia containing fat and omental vessels, calcified gallstone in gallbladder neck with mild gallbladder distension, upper normal CBD for age at 8 mm without visualized choledocholithiasis. LFTs normal. CT without contrast March 2021. Cdiff negative March 2021. No stool cultures.   Randy Thomas, daughter, is present.  Patient states diarrhea onset Feb 28th weekend. Went to [facility] prior to onset of diarrhea. Food tasted burnt to him. Diarrhea has been intermittent since that point, with as many as 9 watery stools per day. Average 2-9 per day. NO rectal bleeding. Bentyl started a week ago TID, which has helped decrease stool frequency and improve consistency. He has not been taking this scheduled. Lomotil without much improvement. More soft, "milkshake" consistency. Taste has changed. Doesn't want to eat as much. Eating smaller portions with early satiety. No GERD or dysphagia. No abdominal pain. Weight loss noted. Usually around 157-161 lbs but got as low as 139 during this, now 143. Regaining weight.   Gallbladder attack in December and no pain since that time. He has seen Dr. Arnoldo Morale and will be followed clinically.   No prior colonoscopy.   Past Medical History:  Diagnosis Date  . Allergy   . GERD (gastroesophageal reflux disease)   . Hyperlipidemia   . Hypertension   . Myocardial infarction ([place])    X 2 stents    Past Surgical History:  Procedure Laterality  Date  . CARDIAC CATHETERIZATION     X 2 stents  . FRACTURE SURGERY     1967 wore cast    Current Outpatient Medications  Medication Sig Dispense Refill  . acetaminophen (TYLENOL ARTHRITIS PAIN) 650 MG CR tablet Take 2 tablets (1,300 mg total) by mouth every 8 (eight) hours as needed. For pain 120 tablet 2  . aspirin EC 81 MG tablet Take 1 tablet (81 mg total) by mouth daily. 30 tablet 3  . cetirizine (ZYRTEC) 10 MG tablet Take 1 tablet (10 mg total) by mouth daily. 30 tablet 11  . cholecalciferol (VITAMIN D) 1000 units tablet Take 1 tablet (1,000 Units total) by mouth daily. 30 tablet 3  . diphenoxylate-atropine (LOMOTIL) 2.5-0.025 MG tablet Take 1 tablet by mouth 4 (four) times daily as needed for diarrhea or loose stools. 60 tablet 0  . HYDROcodone-acetaminophen (NORCO/VICODIN) 5-325 MG tablet Take 1 tablet by mouth every 6 (six) hours as needed. 15 tablet 0  . lansoprazole (PREVACID) 30 MG capsule Take 1 capsule (30 mg total) by mouth 2 (two) times daily. 60 capsule 3  . methocarbamol (ROBAXIN) 750 MG tablet Take 1 tablet (750 mg total) by mouth 4 (four) times daily. 60 tablet 2  . metoprolol succinate (TOPROL-XL) 50 MG 24 hr tablet TAKE 1/2 TABLET BY MOUTH IN THE MORNING AND 1/2 TABLET BY MOUTH IN THE EVENING 60 tablet 4  . Multiple Vitamin (MULTIVITAMIN WITH MINERALS) TABS tablet Take 1 tablet by mouth daily. 30 tablet 3  . ondansetron (ZOFRAN) 4 MG  tablet Take 1 tablet (4 mg total) by mouth every 8 (eight) hours as needed for nausea or vomiting. 20 tablet 0  . rosuvastatin (CRESTOR) 40 MG tablet Take 1 tablet (40 mg total) by mouth at bedtime. 90 tablet 0  . dicyclomine (BENTYL) 10 MG capsule Take 1 capsule (10 mg total) by mouth 3 (three) times daily before meals. Hold if constipated 90 capsule 3  . icosapent Ethyl (VASCEPA) 1 g capsule Take 2 capsules (2 g total) by mouth 2 (two) times daily. 120 capsule 0   No current facility-administered medications for this visit.    Allergies  as of 12/07/2019  . (No Known Allergies)    Family History  Problem Relation Age of Onset  . Asthma Mother   . Hearing loss Mother   . Heart disease Father   . Diabetes Brother   . Diabetes Maternal Grandmother   . Colon cancer Neg Hx   . Colon polyps Neg Hx     Social History   Socioeconomic History  . Marital status: Married    Spouse name: Not on file  . Number of children: Not on file  . Years of education: Not on file  . Highest education level: Not on file  Occupational History  . Occupation: retired    Comment: truck Geophysicist/field seismologist  Tobacco Use  . Smoking status: Former Smoker    Quit date: 09/15/1981    Years since quitting: 38.2  . Smokeless tobacco: Never Used  Substance and Sexual Activity  . Alcohol use: No    Alcohol/week: 0.0 standard drinks  . Drug use: No  . Sexual activity: Not Currently  Other Topics Concern  . Not on file  Social History Narrative  . Not on file   Social Determinants of Health   Financial Resource Strain:   . Difficulty of Paying Living Expenses:   Food Insecurity:   . Worried About Charity fundraiser in the Last Year:   . Arboriculturist in the Last Year:   Transportation Needs:   . Film/video editor (Medical):   Marland Kitchen Lack of Transportation (Non-Medical):   Physical Activity:   . Days of Exercise per Week:   . Minutes of Exercise per Session:   Stress:   . Feeling of Stress :   Social Connections:   . Frequency of Communication with Friends and Family:   . Frequency of Social Gatherings with Friends and Family:   . Attends Religious Services:   . Active Member of Clubs or Organizations:   . Attends Archivist Meetings:   Marland Kitchen Marital Status:   Intimate Partner Violence:   . Fear of Current or Ex-Partner:   . Emotionally Abused:   Marland Kitchen Physically Abused:   . Sexually Abused:     Review of Systems: Gen: see HPI CV: Denies chest pain, heart palpitations, peripheral edema, syncope.  Resp: Denies shortness of breath  at rest or with exertion. Denies wheezing or cough.  GI: see HPI GU : Denies urinary burning, urinary frequency, urinary hesitancy MS: Denies joint pain, muscle weakness, cramps, or limitation of movement.  Derm: Denies rash, itching, dry skin Psych: Denies depression, anxiety, memory loss, and confusion Heme: see HPI  Physical Exam: BP 127/66   Pulse 88   Temp (!) 97.1 F (36.2 C) (Temporal)   Ht 5\' 8"  (1.727 m)   Wt 143 lb 6.4 oz (65 kg)   BMI 21.80 kg/m  General:   Alert and oriented. Pleasant  and cooperative. Well-nourished and well-developed.  Head:  Normocephalic and atraumatic. Eyes:  Without icterus, sclera clear and conjunctiva pink.  Ears:  Normal auditory acuity. Mouth:  Mask in place Lungs:  Clear to auscultation bilaterally.  Heart:  S1, S2 present without murmurs appreciated.  Abdomen:  +BS, soft, non-tender and non-distended. No HSM noted. No guarding or rebound. No masses appreciated.  Rectal:  Deferred  Msk:  Symmetrical without gross deformities. Normal posture. Extremities:  Without edema. Neurologic:  Alert and  oriented x4 Psych:  Alert and cooperative. Normal mood and affect.  Lab Results  Component Value Date   WBC 4.8 11/30/2019   HGB 11.8 (L) 11/30/2019   HCT 36.3 (L) 11/30/2019   MCV 87.3 11/30/2019   PLT 195 11/30/2019     ASSESSMENT: BREWER LESHKO is an 83 y.o. male presenting today with diarrhea present since Feb 2021, with onset after eating at a [employer]. Cdiff negative but no stool culture. Since starting Bentyl, he has had improved consistency and frequency in stool but still with numerous bowel movements. Weight loss initially noted but now regaining.   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 However, with persistent frequent stool, I recommend diagnostic colonoscopy, as he has never had his  lower GI tract evaluated. He is declining this for now but will think about it.  As his stool consistency is improved, no need for further studies such as stool culture. Encouragingly, Cdiff is negative. If he develops recurrent watery diarrhea, recommend GI pathogen panel.   While he decides on colonoscopy, will maximize supportive measures. Both he and his daughter stated understanding.    PLAN:  Take Bentyl scheduled TID. Side effects discussed in detail  Call if recurrent diarrhea  Recommend colonoscopy when patient is willing: he is to call next week with update  Return for close follow-up in 6-8 weeks  Annitta Needs, PhD, ANP-BC [facility]

## 2019-12-09 ENCOUNTER — Other Ambulatory Visit: Payer: Self-pay | Admitting: Cardiology

## 2019-12-09 NOTE — Telephone Encounter (Signed)
Can you look at this refill? A pop up for VASCEPA came up. Please and thank you.

## 2019-12-10 ENCOUNTER — Other Ambulatory Visit: Payer: Self-pay | Admitting: Cardiology

## 2019-12-12 ENCOUNTER — Ambulatory Visit: Payer: Medicare HMO | Admitting: Gastroenterology

## 2019-12-13 NOTE — Progress Notes (Signed)
Cc'ed to pcp °

## 2020-02-07 ENCOUNTER — Encounter: Payer: Self-pay | Admitting: Gastroenterology

## 2020-02-07 ENCOUNTER — Ambulatory Visit: Payer: Medicare HMO | Admitting: Gastroenterology

## 2020-02-07 ENCOUNTER — Other Ambulatory Visit: Payer: Self-pay

## 2020-02-07 VITALS — BP 144/65 | HR 77 | Temp 97.7°F | Ht 68.0 in | Wt 150.4 lb

## 2020-02-07 DIAGNOSIS — R197 Diarrhea, unspecified: Secondary | ICD-10-CM | POA: Diagnosis not present

## 2020-02-07 NOTE — Patient Instructions (Signed)
I am glad you are doing well!  We will see you back as needed.  Please call if any recurrent symptoms, any blood in stools, abdominal pain, weight loss.  I enjoyed seeing you again today! As you know, I value our relationship and want to provide genuine, compassionate, and quality care. I welcome your feedback. If you receive a survey regarding your visit,  I greatly appreciate you taking time to fill this out. See you next time!  Annitta Needs, PhD, ANP-BC [facility]

## 2020-02-07 NOTE — Progress Notes (Signed)
Referring Provider: Susy Frizzle, MD Primary Care Physician:  Susy Frizzle, MD Primary GI: Dr. Gala Romney   Chief Complaint  Patient presents with  . Diarrhea    has about 10-15% of time    HPI:   Randy Thomas is an 84 y.o. male presenting today with a history of diarrhea present since Feb 2021, with onset after eating at a [employer]. Cdiff negative but no stool culture. He was seen as a new patient in March 2021 and had started Bentyl prior to that appt with some improvement. Suspect post-infectious IBS but recommended colonoscopy if no improvement.   BM 2-3 times per day, soft. No watery. No abdominal pain. Not taking Bentyl any longer. No rectal bleeding. Appetite is good. Weight increased since last visit. Weighs each evening. Daughter present with him. No N/V. Feels nearly back to baseline.    Past Medical History:  Diagnosis Date  . Allergy   . GERD (gastroesophageal reflux disease)   . Hyperlipidemia   . Hypertension   . Myocardial infarction ([place])    X 2 stents    Past Surgical History:  Procedure Laterality Date  . CARDIAC CATHETERIZATION     X 2 stents  . FRACTURE SURGERY     1967 wore cast    Current Outpatient Medications  Medication Sig Dispense Refill  . acetaminophen (TYLENOL ARTHRITIS PAIN) 650 MG CR tablet Take 2 tablets (1,300 mg total) by mouth every 8 (eight) hours as needed. For pain 120 tablet 2  . aspirin EC 81 MG tablet Take 1 tablet (81 mg total) by mouth daily. 30 tablet 3  . cetirizine (ZYRTEC) 10 MG tablet Take 1 tablet (10 mg total) by mouth daily. 30 tablet 11  . cholecalciferol (VITAMIN D) 1000 units tablet Take 1 tablet (1,000 Units total) by mouth daily. 30 tablet 3  . HYDROcodone-acetaminophen (NORCO/VICODIN) 5-325 MG tablet Take 1 tablet by mouth every 6 (six) hours as needed. 15 tablet 0  . lansoprazole (PREVACID) 30 MG capsule Take 1 capsule (30 mg total) by mouth 2 (two) times daily. 60 capsule 3  .  methocarbamol (ROBAXIN) 750 MG tablet Take 1 tablet (750 mg total) by mouth 4 (four) times daily. (Patient taking differently: Take 750 mg by mouth 4 (four) times daily. As needed) 60 tablet 2  . metoprolol succinate (TOPROL-XL) 50 MG 24 hr tablet TAKE 1/2 TABLET BY MOUTH IN THE MORNING AND 1/2 TABLET BY MOUTH IN THE EVENING 60 tablet 4  . Multiple Vitamin (MULTIVITAMIN WITH MINERALS) TABS tablet Take 1 tablet by mouth daily. 30 tablet 3  . Omega-3 Fatty Acids (FISH OIL) 1000 MG CAPS Take by mouth daily.    . rosuvastatin (CRESTOR) 40 MG tablet Take 1 tablet (40 mg total) by mouth at bedtime. 90 tablet 0   No current facility-administered medications for this visit.    Allergies as of 02/07/2020  . (No Known Allergies)    Family History  Problem Relation Age of Onset  . Asthma Mother   . Hearing loss Mother   . Heart disease Father   . Diabetes Brother   . Diabetes Maternal Grandmother   . Colon cancer Neg Hx   . Colon polyps Neg Hx     Social History   Socioeconomic History  . Marital status: Married    Spouse name: Not on file  . Number of children: Not on file  . Years of education: Not on file  .  Highest education level: Not on file  Occupational History  . Occupation: retired    Comment: truck Geophysicist/field seismologist  Tobacco Use  . Smoking status: Former Smoker    Quit date: 09/15/1981    Years since quitting: 38.4  . Smokeless tobacco: Never Used  Substance and Sexual Activity  . Alcohol use: No    Alcohol/week: 0.0 standard drinks  . Drug use: No  . Sexual activity: Not Currently  Other Topics Concern  . Not on file  Social History Narrative  . Not on file   Social Determinants of Health   Financial Resource Strain:   . Difficulty of Paying Living Expenses:   Food Insecurity:   . Worried About Charity fundraiser in the Last Year:   . Arboriculturist in the Last Year:   Transportation Needs:   . Film/video editor (Medical):   Marland Kitchen Lack of Transportation  (Non-Medical):   Physical Activity:   . Days of Exercise per Week:   . Minutes of Exercise per Session:   Stress:   . Feeling of Stress :   Social Connections:   . Frequency of Communication with Friends and Family:   . Frequency of Social Gatherings with Friends and Family:   . Attends Religious Services:   . Active Member of Clubs or Organizations:   . Attends Archivist Meetings:   Marland Kitchen Marital Status:     Review of Systems: Gen: Denies fever, chills, anorexia. Denies fatigue, weakness, weight loss.  CV: Denies chest pain, palpitations, syncope, peripheral edema, and claudication. Resp: Denies dyspnea at rest, cough, wheezing, coughing up blood, and pleurisy. GI: see HPI Derm: Denies rash, itching, dry skin Psych: Denies depression, anxiety, memory loss, confusion. No homicidal or suicidal ideation.  Heme: Denies bruising, bleeding, and enlarged lymph nodes.  Physical Exam: BP (!) 144/65   Pulse 77   Temp 97.7 F (36.5 C) (Oral)   Ht 5\' 8"  (1.727 m)   Wt 150 lb 6.4 oz (68.2 kg)   BMI 22.87 kg/m  General:   Alert and oriented. No distress noted. Pleasant and cooperative.  Head:  Normocephalic and atraumatic. Eyes:  Conjuctiva clear without scleral icterus. Mouth: mask in place Abdomen:  +BS, soft, non-tender and non-distended. No rebound or guarding. Moderate sized umbilical hernia Msk:  Symmetrical without gross deformities. Normal posture. Extremities:  Without edema. Neurologic:  Alert and  oriented x4 Psych:  Alert and cooperative. Normal mood and affect.  ASSESSMENT: Randy Thomas is an 84 y.o. male presenting today with history of likely post-infectious IBS earlier this year after eating out at a [employer], doing well now without need for Bentyl. He is currently gaining weight and back to baseline. No concerning lower or upper GI signs/symptoms. No need for colonoscopy at this point unless recurrent diarrhea. Daughter present with him today and  agreeable to this plan as well.   PLAN:  Call if recurrent diarrhea  No colonoscopy indicated at this time  Return prn  Annitta Needs, PhD, [facility]

## 2020-02-14 NOTE — Progress Notes (Signed)
Cc'ed to pcp °

## 2020-02-15 ENCOUNTER — Telehealth: Payer: Self-pay | Admitting: Emergency Medicine

## 2020-02-15 NOTE — Telephone Encounter (Signed)
[employer] called and asked if we are able to change pts medication, he was placed on bentyl. States medication can cause cognitive decline in older patients  phone [phone], fax number [phone].Marland Kitchen they will also fax something and they are requesting a medication change.

## 2020-02-16 NOTE — Telephone Encounter (Signed)
Yes, fully aware of this and it is to be used in caution with older adults. However, he is no longer taking this.

## 2020-02-17 NOTE — Telephone Encounter (Signed)
[employer] notified

## 2020-03-28 ENCOUNTER — Telehealth: Payer: Self-pay | Admitting: Family Medicine

## 2020-03-28 NOTE — Progress Notes (Signed)
  Chronic Care Management   Note  03/28/2020 Name: Randy Thomas MRN: [id number] DOB: [birth year]  Randy Thomas is a 84 y.o. year old male who is a primary care patient of Dennard Schaumann, Cammie Mcgee, MD. I reached out to Kerby Nora by phone today in response to a referral sent by [name]'s PCP, Susy Frizzle, MD.   [name] was given information about Chronic Care Management services today including:  1. CCM service includes personalized support from designated clinical staff supervised by his physician, including individualized plan of care and coordination with other care providers 2. 24/7 contact phone numbers for assistance for urgent and routine care needs. 3. Service will only be billed when office clinical staff spend 20 minutes or more in a month to coordinate care. 4. Only one practitioner may furnish and bill the service in a calendar month. 5. The patient may stop CCM services at any time (effective at the end of the month) by phone call to the office staff.   Patient agreed to services and verbal consent obtained.   Follow up plan:   [name]

## 2020-05-01 ENCOUNTER — Ambulatory Visit (INDEPENDENT_AMBULATORY_CARE_PROVIDER_SITE_OTHER): Payer: Medicare HMO | Admitting: Nurse Practitioner

## 2020-05-01 ENCOUNTER — Encounter: Payer: Self-pay | Admitting: Nurse Practitioner

## 2020-05-01 ENCOUNTER — Other Ambulatory Visit: Payer: Self-pay

## 2020-05-01 VITALS — BP 110/70 | HR 73 | Temp 98.1°F | Ht 68.0 in | Wt 156.0 lb

## 2020-05-01 DIAGNOSIS — H00039 Abscess of eyelid unspecified eye, unspecified eyelid: Secondary | ICD-10-CM

## 2020-05-01 DIAGNOSIS — H00019 Hordeolum externum unspecified eye, unspecified eyelid: Secondary | ICD-10-CM | POA: Diagnosis not present

## 2020-05-01 MED ORDER — TRIPLE ANTIBIOTIC 5-400-5000 EX OINT: TOPICAL_OINTMENT | Freq: Three times a day (TID) | CUTANEOUS | 0 refills | Status: DC

## 2020-05-01 NOTE — Progress Notes (Signed)
Established Patient Office Visit  Subjective:  Patient ID: MARKANTHONY GEDNEY, male    DOB: 07/01/1936  Age: 84 y.o. MRN: [id number]  CC:  Chief Complaint  Patient presents with  . eye infection    yellow discharge/ redness/ gritty feel    HPI ESEQUIEL KLEINFELTER is a 84 year old male very hoh accompanied by his wife  Presenting to the clinic with c/o 3 styes to bilateral upper eyelids externally that have been present for 3-4 weeks without any txs tried  Past Medical History:  Diagnosis Date  . Allergy   . GERD (gastroesophageal reflux disease)   . Hyperlipidemia   . Hypertension   . Myocardial infarction ([place])    X 2 stents    Past Surgical History:  Procedure Laterality Date  . CARDIAC CATHETERIZATION     X 2 stents  . FRACTURE SURGERY     1967 wore cast    Family History  Problem Relation Age of Onset  . Asthma Mother   . Hearing loss Mother   . Heart disease Father   . Diabetes Brother   . Diabetes Maternal Grandmother   . Colon cancer Neg Hx   . Colon polyps Neg Hx     Social History   Socioeconomic History  . Marital status: Married    Spouse name: Not on file  . Number of children: Not on file  . Years of education: Not on file  . Highest education level: Not on file  Occupational History  . Occupation: retired    Comment: truck Geophysicist/field seismologist  Tobacco Use  . Smoking status: Former Smoker    Quit date: 09/15/1981    Years since quitting: 38.6  . Smokeless tobacco: Never Used  Substance and Sexual Activity  . Alcohol use: No    Alcohol/week: 0.0 standard drinks  . Drug use: No  . Sexual activity: Not Currently  Other Topics Concern  . Not on file  Social History Narrative  . Not on file   Social Determinants of Health   Financial Resource Strain:   . Difficulty of Paying Living Expenses: Not on file  Food Insecurity:   . Worried About Charity fundraiser in the Last Year: Not on file  . Ran Out of Food in the Last Year: Not on file  Transportation  Needs:   . Lack of Transportation (Medical): Not on file  . Lack of Transportation (Non-Medical): Not on file  Physical Activity:   . Days of Exercise per Week: Not on file  . Minutes of Exercise per Session: Not on file  Stress:   . Feeling of Stress : Not on file  Social Connections:   . Frequency of Communication with Friends and Family: Not on file  . Frequency of Social Gatherings with Friends and Family: Not on file  . Attends Religious Services: Not on file  . Active Member of Clubs or Organizations: Not on file  . Attends Archivist Meetings: Not on file  . Marital Status: Not on file  Intimate Partner Violence:   . Fear of Current or Ex-Partner: Not on file  . Emotionally Abused: Not on file  . Physically Abused: Not on file  . Sexually Abused: Not on file    Outpatient Medications Prior to Visit  Medication Sig Dispense Refill  . acetaminophen (TYLENOL ARTHRITIS PAIN) 650 MG CR tablet Take 2 tablets (1,300 mg total) by mouth every 8 (eight) hours as needed. For pain 120 tablet  2  . aspirin EC 81 MG tablet Take 1 tablet (81 mg total) by mouth daily. 30 tablet 3  . cetirizine (ZYRTEC) 10 MG tablet Take 1 tablet (10 mg total) by mouth daily. 30 tablet 11  . cholecalciferol (VITAMIN D) 1000 units tablet Take 1 tablet (1,000 Units total) by mouth daily. 30 tablet 3  . HYDROcodone-acetaminophen (NORCO/VICODIN) 5-325 MG tablet Take 1 tablet by mouth every 6 (six) hours as needed. 15 tablet 0  . lansoprazole (PREVACID) 30 MG capsule Take 1 capsule (30 mg total) by mouth 2 (two) times daily. 60 capsule 3  . methocarbamol (ROBAXIN) 750 MG tablet Take 1 tablet (750 mg total) by mouth 4 (four) times daily. (Patient taking differently: Take 750 mg by mouth 4 (four) times daily. As needed) 60 tablet 2  . metoprolol succinate (TOPROL-XL) 50 MG 24 hr tablet TAKE 1/2 TABLET BY MOUTH IN THE MORNING AND 1/2 TABLET BY MOUTH IN THE EVENING 60 tablet 4  . Multiple Vitamin (MULTIVITAMIN  WITH MINERALS) TABS tablet Take 1 tablet by mouth daily. 30 tablet 3  . Omega-3 Fatty Acids (FISH OIL) 1000 MG CAPS Take by mouth daily.    . rosuvastatin (CRESTOR) 40 MG tablet Take 1 tablet (40 mg total) by mouth at bedtime. 90 tablet 0   No facility-administered medications prior to visit.    No Known Allergies  ROS Review of Systems  All other systems reviewed and are negative.     Objective:    Physical Exam Vitals and nursing note reviewed.  Constitutional:      Appearance: Normal appearance.  HENT:     Head: Normocephalic.     Nose: Nose normal.     Mouth/Throat:     Lips: Pink.  Eyes:     General: Lids are normal. Lids are everted, no foreign bodies appreciated. Gaze aligned appropriately.        Right eye: Hordeolum present. No foreign body or discharge.        Left eye: Hordeolum present.No foreign body or discharge.     Extraocular Movements: Extraocular movements intact.     Right eye: Normal extraocular motion and no nystagmus.     Left eye: Normal extraocular motion and no nystagmus.     Conjunctiva/sclera: Conjunctivae normal.     Right eye: Right conjunctiva is not injected. No chemosis, exudate or hemorrhage.    Left eye: Left conjunctiva is not injected. No chemosis, exudate or hemorrhage.    Pupils: Pupils are equal, round, and reactive to light.      Comments: 2 hordeolum to left upper external lid and one to right upper external lid appears to be abscessing. Is draining light yellow no odorous , mild erythema  Cardiovascular:     Rate and Rhythm: Normal rate.  Pulmonary:     Effort: Pulmonary effort is normal.  Musculoskeletal:        General: Normal range of motion.     Cervical back: Full passive range of motion without pain, normal range of motion and neck supple.  Skin:    General: Skin is warm and dry.     Capillary Refill: Capillary refill takes less than 2 seconds.  Neurological:     General: No focal deficit present.     Mental Status:  He is alert and oriented to person, place, and time.  Psychiatric:        Mood and Affect: Mood normal.        Behavior: Behavior normal.  Thought Content: Thought content normal.        Judgment: Judgment normal.     BP 110/70   Pulse 73   Temp 98.1 F (36.7 C)   Ht 5\' 8"  (1.727 m)   Wt 156 lb (70.8 kg)   SpO2 97%   BMI 23.72 kg/m  Wt Readings from Last 3 Encounters:  05/01/20 156 lb (70.8 kg)  02/07/20 150 lb 6.4 oz (68.2 kg)  12/07/19 143 lb 6.4 oz (65 kg)     Health Maintenance Due  Topic Date Due  . COVID-19 Vaccine (1) Never done  . INFLUENZA VACCINE  04/15/2020    There are no preventive care reminders to display for this patient.  Lab Results  Component Value Date   TSH 1.85 02/10/2018   Lab Results  Component Value Date   WBC 4.8 11/30/2019   HGB 11.8 (L) 11/30/2019   HCT 36.3 (L) 11/30/2019   MCV 87.3 11/30/2019   PLT 195 11/30/2019   Lab Results  Component Value Date   NA 140 11/30/2019   K 4.6 11/30/2019   CO2 29 11/30/2019   GLUCOSE 112 (H) 11/30/2019   BUN 13 11/30/2019   CREATININE 0.63 (L) 11/30/2019   BILITOT 0.8 11/30/2019   ALKPHOS 69 08/22/2019   AST 21 11/30/2019   ALT 18 11/30/2019   PROT 5.8 (L) 11/30/2019   ALBUMIN 4.1 08/22/2019   CALCIUM 8.7 11/30/2019   ANIONGAP 10 08/22/2019   Lab Results  Component Value Date   CHOL 143 05/05/2019   Lab Results  Component Value Date   HDL 31 (L) 05/05/2019   Lab Results  Component Value Date   LDLCALC 85 05/05/2019   Lab Results  Component Value Date   TRIG 168 (H) 05/05/2019   Lab Results  Component Value Date   CHOLHDL 4.6 05/05/2019   No results found for: HGBA1C    Assessment & Plan:   Problem List Items Addressed This Visit    None    Visit Diagnoses    Hordeolum externum, unspecified laterality    -  Primary   Relevant Medications   neomycin-bacitracin-polymyxin (NEOSPORIN) [id number] ointment   Other Relevant Orders   Ambulatory referral to  Ophthalmology   Abscess of eyelid, unspecified laterality         abscess hordeolum externum to bilateral upper eyelid. Warm compress 4 or more times a day, antibiotic ointment externally, referral for I/D drainage   Meds ordered this encounter  Medications  . neomycin-bacitracin-polymyxin (NEOSPORIN) [id number] ointment    Sig: Apply topically 3 (three) times daily. To the affected eyelid    Dispense:  28.3 g    Refill:  0    Follow-up: Return if symptoms worsen or fail to improve.    Annie Main, FNP

## 2020-05-17 ENCOUNTER — Emergency Department (HOSPITAL_COMMUNITY)
Admission: EM | Admit: 2020-05-17 | Discharge: 2020-05-18 | Discharge status: Against Medical Advice | Payer: No Typology Code available for payment source

## 2020-05-17 NOTE — ED Notes (Signed)
Pt stated they were leaving bc of the wait time

## 2020-05-18 ENCOUNTER — Observation Stay (HOSPITAL_COMMUNITY): Payer: No Typology Code available for payment source | Admitting: Anesthesiology

## 2020-05-18 ENCOUNTER — Observation Stay (HOSPITAL_COMMUNITY): Payer: No Typology Code available for payment source

## 2020-05-18 ENCOUNTER — Encounter (HOSPITAL_COMMUNITY)
Admission: EM | Disposition: A | Discharge status: Other Acute Inpatient Hospital | Payer: Self-pay | Source: Home / Self Care | Attending: Internal Medicine

## 2020-05-18 ENCOUNTER — Inpatient Hospital Stay (HOSPITAL_COMMUNITY)
Admission: EM | Admit: 2020-05-18 | Discharge: 2020-05-20 | DRG: 418 | Disposition: A | Discharge status: Other Acute Inpatient Hospital | Payer: No Typology Code available for payment source | Attending: Internal Medicine | Admitting: Internal Medicine

## 2020-05-18 ENCOUNTER — Encounter (HOSPITAL_COMMUNITY): Payer: Self-pay | Admitting: Emergency Medicine

## 2020-05-18 ENCOUNTER — Emergency Department (HOSPITAL_COMMUNITY): Payer: No Typology Code available for payment source

## 2020-05-18 ENCOUNTER — Other Ambulatory Visit: Payer: Self-pay

## 2020-05-18 DIAGNOSIS — D649 Anemia, unspecified: Secondary | ICD-10-CM | POA: Diagnosis present

## 2020-05-18 DIAGNOSIS — Z79899 Other long term (current) drug therapy: Secondary | ICD-10-CM | POA: Diagnosis not present

## 2020-05-18 DIAGNOSIS — Z5329 Procedure and treatment not carried out because of patient's decision for other reasons: Secondary | ICD-10-CM | POA: Diagnosis present

## 2020-05-18 DIAGNOSIS — Z20822 Contact with and (suspected) exposure to covid-19: Secondary | ICD-10-CM | POA: Diagnosis present

## 2020-05-18 DIAGNOSIS — K8050 Calculus of bile duct without cholangitis or cholecystitis without obstruction: Secondary | ICD-10-CM

## 2020-05-18 DIAGNOSIS — S3613XA Injury of bile duct, initial encounter: Secondary | ICD-10-CM

## 2020-05-18 DIAGNOSIS — K8013 Calculus of gallbladder with acute and chronic cholecystitis with obstruction: Secondary | ICD-10-CM | POA: Diagnosis not present

## 2020-05-18 DIAGNOSIS — K811 Chronic cholecystitis: Secondary | ICD-10-CM | POA: Diagnosis not present

## 2020-05-18 DIAGNOSIS — F419 Anxiety disorder, unspecified: Secondary | ICD-10-CM | POA: Diagnosis present

## 2020-05-18 DIAGNOSIS — K9171 Accidental puncture and laceration of a digestive system organ or structure during a digestive system procedure: Secondary | ICD-10-CM | POA: Diagnosis not present

## 2020-05-18 DIAGNOSIS — K831 Obstruction of bile duct: Secondary | ICD-10-CM | POA: Diagnosis not present

## 2020-05-18 DIAGNOSIS — K7589 Other specified inflammatory liver diseases: Secondary | ICD-10-CM | POA: Diagnosis present

## 2020-05-18 DIAGNOSIS — K429 Umbilical hernia without obstruction or gangrene: Secondary | ICD-10-CM | POA: Diagnosis present

## 2020-05-18 DIAGNOSIS — K851 Biliary acute pancreatitis without necrosis or infection: Secondary | ICD-10-CM | POA: Diagnosis not present

## 2020-05-18 DIAGNOSIS — Z87891 Personal history of nicotine dependence: Secondary | ICD-10-CM

## 2020-05-18 DIAGNOSIS — K8020 Calculus of gallbladder without cholecystitis without obstruction: Secondary | ICD-10-CM

## 2020-05-18 DIAGNOSIS — Z955 Presence of coronary angioplasty implant and graft: Secondary | ICD-10-CM | POA: Diagnosis not present

## 2020-05-18 DIAGNOSIS — I10 Essential (primary) hypertension: Secondary | ICD-10-CM | POA: Diagnosis not present

## 2020-05-18 DIAGNOSIS — I252 Old myocardial infarction: Secondary | ICD-10-CM | POA: Diagnosis not present

## 2020-05-18 DIAGNOSIS — K838 Other specified diseases of biliary tract: Secondary | ICD-10-CM

## 2020-05-18 DIAGNOSIS — K219 Gastro-esophageal reflux disease without esophagitis: Secondary | ICD-10-CM | POA: Diagnosis not present

## 2020-05-18 DIAGNOSIS — Z9049 Acquired absence of other specified parts of digestive tract: Secondary | ICD-10-CM | POA: Diagnosis not present

## 2020-05-18 DIAGNOSIS — Z7982 Long term (current) use of aspirin: Secondary | ICD-10-CM | POA: Diagnosis not present

## 2020-05-18 DIAGNOSIS — Z09 Encounter for follow-up examination after completed treatment for conditions other than malignant neoplasm: Secondary | ICD-10-CM | POA: Diagnosis not present

## 2020-05-18 DIAGNOSIS — R410 Disorientation, unspecified: Secondary | ICD-10-CM | POA: Diagnosis not present

## 2020-05-18 DIAGNOSIS — K859 Acute pancreatitis without necrosis or infection, unspecified: Secondary | ICD-10-CM

## 2020-05-18 DIAGNOSIS — E785 Hyperlipidemia, unspecified: Secondary | ICD-10-CM | POA: Diagnosis not present

## 2020-05-18 DIAGNOSIS — R932 Abnormal findings on diagnostic imaging of liver and biliary tract: Secondary | ICD-10-CM

## 2020-05-18 DIAGNOSIS — I2510 Atherosclerotic heart disease of native coronary artery without angina pectoris: Secondary | ICD-10-CM | POA: Diagnosis not present

## 2020-05-18 DIAGNOSIS — Z8249 Family history of ischemic heart disease and other diseases of the circulatory system: Secondary | ICD-10-CM | POA: Diagnosis not present

## 2020-05-18 DIAGNOSIS — K9189 Other postprocedural complications and disorders of digestive system: Secondary | ICD-10-CM | POA: Diagnosis not present

## 2020-05-18 DIAGNOSIS — K8061 Calculus of gallbladder and bile duct with cholecystitis, unspecified, with obstruction: Secondary | ICD-10-CM | POA: Diagnosis present

## 2020-05-18 DIAGNOSIS — K828 Other specified diseases of gallbladder: Secondary | ICD-10-CM | POA: Diagnosis not present

## 2020-05-18 HISTORY — PX: ERCP: SHX5425

## 2020-05-18 HISTORY — DX: Unspecified hearing loss, unspecified ear: H91.90

## 2020-05-18 HISTORY — DX: Pure hypercholesterolemia, unspecified: E78.00

## 2020-05-18 HISTORY — PX: SPHINCTEROTOMY: SHX5544

## 2020-05-18 LAB — LIPASE, BLOOD: Lipase: 1868 U/L — ABNORMAL HIGH (ref 11–51)

## 2020-05-18 LAB — COMPREHENSIVE METABOLIC PANEL
ALT: 273 U/L — ABNORMAL HIGH (ref 0–44)
AST: 373 U/L — ABNORMAL HIGH (ref 15–41)
Albumin: 4.4 g/dL (ref 3.5–5.0)
Alkaline Phosphatase: 157 U/L — ABNORMAL HIGH (ref 38–126)
Anion gap: 11 (ref 5–15)
BUN: 18 mg/dL (ref 8–23)
CO2: 27 mmol/L (ref 22–32)
Calcium: 9.7 mg/dL (ref 8.9–10.3)
Chloride: 98 mmol/L (ref 98–111)
Creatinine, Ser: 0.77 mg/dL (ref 0.61–1.24)
GFR calc Af Amer: >60 mL/min (ref >60)
GFR calc non Af Amer: >60 mL/min (ref >60)
Glucose, Bld: 174 mg/dL — ABNORMAL HIGH (ref 70–99)
Potassium: 4.4 mmol/L (ref 3.5–5.1)
Sodium: 136 mmol/L (ref 135–145)
Total Bilirubin: 2.5 mg/dL — ABNORMAL HIGH (ref 0.3–1.2)
Total Protein: 7.4 g/dL (ref 6.5–8.1)

## 2020-05-18 LAB — URINALYSIS, ROUTINE W REFLEX MICROSCOPIC
Bilirubin Urine: NEGATIVE
Glucose, UA: NEGATIVE mg/dL
Hgb urine dipstick: NEGATIVE
Ketones, ur: NEGATIVE mg/dL
Leukocytes,Ua: NEGATIVE
Nitrite: NEGATIVE
Protein, ur: NEGATIVE mg/dL
Specific Gravity, Urine: 1.018 (ref 1.005–1.030)
pH: 6 (ref 5.0–8.0)

## 2020-05-18 LAB — CBC
HCT: 38.2 % — ABNORMAL LOW (ref 39.0–52.0)
Hemoglobin: 12.7 g/dL — ABNORMAL LOW (ref 13.0–17.0)
MCH: 30.4 pg (ref 26.0–34.0)
MCHC: 33.2 g/dL (ref 30.0–36.0)
MCV: 91.4 fL (ref 80.0–100.0)
Platelets: 159 10*3/uL (ref 150–400)
RBC: 4.18 MIL/uL — ABNORMAL LOW (ref 4.22–5.81)
RDW: 13.6 % (ref 11.5–15.5)
WBC: 10.6 10*3/uL — ABNORMAL HIGH (ref 4.0–10.5)
nRBC: 0 % (ref 0.0–0.2)

## 2020-05-18 LAB — TROPONIN I (HIGH SENSITIVITY)
Troponin I (High Sensitivity): 7 ng/L (ref <18)
Troponin I (High Sensitivity): 7 ng/L (ref <18)

## 2020-05-18 LAB — SARS CORONAVIRUS 2 BY RT PCR (HOSPITAL ORDER, PERFORMED IN ~~LOC~~ HOSPITAL LAB): SARS Coronavirus 2: NEGATIVE

## 2020-05-18 SURGERY — ERCP, WITH INTERVENTION IF INDICATED
Anesthesia: General

## 2020-05-18 MED ORDER — SUGAMMADEX SODIUM 500 MG/5ML IV SOLN
INTRAVENOUS | Status: DC | PRN
  Administered 2020-05-18: 200 mg via INTRAVENOUS

## 2020-05-18 MED ORDER — FENTANYL CITRATE (PF) 100 MCG/2ML IJ SOLN
25.0000 ug | Freq: Once | INTRAMUSCULAR | Status: AC
  Administered 2020-05-18: 25 ug via INTRAVENOUS
  Filled 2020-05-18: qty 2

## 2020-05-18 MED ORDER — ACETAMINOPHEN 325 MG PO TABS: 650.0000 mg | ORAL_TABLET | Freq: Four times a day (QID) | ORAL | Status: DC | PRN

## 2020-05-18 MED ORDER — SODIUM CHLORIDE 0.9 % IV BOLUS
1000.0000 mL | Freq: Once | INTRAVENOUS | Status: AC
  Administered 2020-05-18: 1000 mL via INTRAVENOUS

## 2020-05-18 MED ORDER — FENTANYL CITRATE (PF) 100 MCG/2ML IJ SOLN
INTRAMUSCULAR | Status: DC | PRN
  Administered 2020-05-18: 50 ug via INTRAVENOUS

## 2020-05-18 MED ORDER — FENTANYL CITRATE (PF) 250 MCG/5ML IJ SOLN
INTRAMUSCULAR | Status: AC
Start: 2020-05-18 — End: ?
  Filled 2020-05-18: qty 5

## 2020-05-18 MED ORDER — SODIUM CHLORIDE 0.9 % IV SOLN
2.0000 g | INTRAVENOUS | Status: DC
  Administered 2020-05-18 – 2020-05-20 (×3): 2 g via INTRAVENOUS
  Filled 2020-05-18 (×3): qty 20

## 2020-05-18 MED ORDER — SODIUM CHLORIDE 0.9 % IV SOLN: INTRAVENOUS | Status: DC | PRN

## 2020-05-18 MED ORDER — ONDANSETRON HCL 4 MG/2ML IJ SOLN
4.0000 mg | Freq: Once | INTRAMUSCULAR | Status: AC
  Administered 2020-05-18: 4 mg via INTRAVENOUS
  Filled 2020-05-18: qty 2

## 2020-05-18 MED ORDER — MIDAZOLAM HCL 5 MG/5ML IJ SOLN
INTRAMUSCULAR | Status: DC | PRN
  Administered 2020-05-18: 1 mg via INTRAVENOUS

## 2020-05-18 MED ORDER — ONDANSETRON HCL 4 MG/2ML IJ SOLN: 4.0000 mg | Freq: Four times a day (QID) | INTRAMUSCULAR | Status: DC | PRN

## 2020-05-18 MED ORDER — ONDANSETRON HCL 4 MG/2ML IJ SOLN: 4.0000 mg | Freq: Once | INTRAMUSCULAR | Status: DC | PRN

## 2020-05-18 MED ORDER — HYDROMORPHONE HCL 1 MG/ML IJ SOLN
1.0000 mg | INTRAMUSCULAR | Status: DC | PRN
  Administered 2020-05-18 – 2020-05-20 (×2): 1 mg via INTRAVENOUS
  Filled 2020-05-18 (×2): qty 1

## 2020-05-18 MED ORDER — ACETAMINOPHEN 650 MG RE SUPP: 650.0000 mg | Freq: Four times a day (QID) | RECTAL | Status: DC | PRN

## 2020-05-18 MED ORDER — SODIUM CHLORIDE 0.9 % IV SOLN: 1.0000 g | INTRAVENOUS | Status: DC

## 2020-05-18 MED ORDER — LACTATED RINGERS IV SOLN: Freq: Once | INTRAVENOUS | Status: AC

## 2020-05-18 MED ORDER — SODIUM CHLORIDE 0.9 % IV SOLN: INTRAVENOUS | Status: DC

## 2020-05-18 MED ORDER — PANTOPRAZOLE SODIUM 40 MG IV SOLR
40.0000 mg | INTRAVENOUS | Status: DC
  Administered 2020-05-18 – 2020-05-19 (×2): 40 mg via INTRAVENOUS
  Filled 2020-05-18 (×3): qty 40

## 2020-05-18 MED ORDER — PROPOFOL 10 MG/ML IV BOLUS
INTRAVENOUS | Status: AC
  Filled 2020-05-18: qty 20

## 2020-05-18 MED ORDER — METOPROLOL TARTRATE 5 MG/5ML IV SOLN
INTRAVENOUS | Status: DC | PRN
  Administered 2020-05-18: 10 mg via INTRAVENOUS

## 2020-05-18 MED ORDER — LACTATED RINGERS IV SOLN: INTRAVENOUS | Status: DC | PRN

## 2020-05-18 MED ORDER — LORAZEPAM 2 MG/ML IJ SOLN
0.5000 mg | Freq: Once | INTRAMUSCULAR | Status: AC | PRN
  Administered 2020-05-18: 0.5 mg via INTRAVENOUS
  Filled 2020-05-18: qty 1

## 2020-05-18 MED ORDER — IOHEXOL 300 MG/ML  SOLN
100.0000 mL | Freq: Once | INTRAMUSCULAR | Status: AC | PRN
  Administered 2020-05-18: 100 mL via INTRAVENOUS

## 2020-05-18 MED ORDER — GLUCAGON HCL RDNA (DIAGNOSTIC) 1 MG IJ SOLR
INTRAMUSCULAR | Status: AC
  Filled 2020-05-18: qty 2

## 2020-05-18 MED ORDER — PROPOFOL 10 MG/ML IV BOLUS
INTRAVENOUS | Status: DC | PRN
  Administered 2020-05-18: 100 mg via INTRAVENOUS

## 2020-05-18 MED ORDER — ONDANSETRON HCL 4 MG PO TABS: 4.0000 mg | ORAL_TABLET | Freq: Four times a day (QID) | ORAL | Status: DC | PRN

## 2020-05-18 MED ORDER — GADOBUTROL 1 MMOL/ML IV SOLN
7.0000 mL | Freq: Once | INTRAVENOUS | Status: AC | PRN
  Administered 2020-05-18: 7 mL via INTRAVENOUS

## 2020-05-18 MED ORDER — DEXAMETHASONE SODIUM PHOSPHATE 10 MG/ML IJ SOLN
INTRAMUSCULAR | Status: DC | PRN
  Administered 2020-05-18: 5 mg via INTRAVENOUS

## 2020-05-18 MED ORDER — HYDROMORPHONE HCL 1 MG/ML IJ SOLN: 0.2500 mg | INTRAMUSCULAR | Status: DC | PRN

## 2020-05-18 MED ORDER — MIDAZOLAM HCL 2 MG/2ML IJ SOLN
INTRAMUSCULAR | Status: AC
  Filled 2020-05-18: qty 2

## 2020-05-18 MED ORDER — ROCURONIUM BROMIDE 100 MG/10ML IV SOLN
INTRAVENOUS | Status: DC | PRN
  Administered 2020-05-18: 40 mg via INTRAVENOUS

## 2020-05-18 MED ORDER — WATER FOR IRRIGATION, STERILE IR SOLN
Status: DC | PRN
  Administered 2020-05-18: 1000 mL

## 2020-05-18 MED ORDER — METOPROLOL TARTRATE 5 MG/5ML IV SOLN
5.0000 mg | Freq: Three times a day (TID) | INTRAVENOUS | Status: DC
  Administered 2020-05-18 – 2020-05-20 (×6): 5 mg via INTRAVENOUS
  Filled 2020-05-18 (×6): qty 5

## 2020-05-18 MED ORDER — LIDOCAINE HCL (CARDIAC) PF 100 MG/5ML IV SOSY
PREFILLED_SYRINGE | INTRAVENOUS | Status: DC | PRN
  Administered 2020-05-18: 60 mg via INTRAVENOUS

## 2020-05-18 MED ORDER — ONDANSETRON HCL 4 MG/2ML IJ SOLN
INTRAMUSCULAR | Status: DC | PRN
  Administered 2020-05-18: 4 mg via INTRAVENOUS

## 2020-05-18 MED ORDER — SODIUM CHLORIDE 0.9 % IV SOLN
INTRAVENOUS | Status: AC
  Filled 2020-05-18: qty 100

## 2020-05-18 NOTE — Consult Note (Addendum)
I was present with the medical student for this service. I personally verified the history of present illness, performed the physical exam, and made the plan for this encounter. I have verified the medical student's documentation and made modifications where appropriately. I have personally documented in my own words a brief history, physical, and plan below.     Patient with prior attempt at lap chole that was aborted due to anxiety from the patient. Now with gallstone pancreatitis/ choledocholithiasis. Getting ERCP. Discussed option of inpatient lap chole with patient to prevent recurrence of this disease process. He is in agreement. Also with umbilical hernia he wants repaired if possible.  Soft, nondistended, mildly tender Personally reviewed CT and MRCP- distal CBD stone likely, contracted gallbladder on CT   Discussed surgery with patient and he thinks he wants to proceed this admission. Discussed option of Sunday.   Will see how patient does tomorrow after ERCP and hopefully get lap chole Sunday.  Carmyn Hamm, MD [facility] [address] [place], [place] [address] [phone] (office)   Reason for Consult: Choledocholithiasis with acute pancreatitis Referring Physician: Costin Gherghe, MD  Randy Thomas is an 84 y.o. male.  HPI: Randy Thomas is a 84 year old caucasian male with a PMHx significant for HTN, HLD, and MI 2/2 CAD s/p stenting 2006 who presented to the ED yesterday after approximately 7 hours of abdominal pain and nausea. He states he started to feel a bandlike, hot pain across his epigastrium around 4 PM yesterday. This improved after approximately an hour. After dinner that night his pain slowly started again and peaked around 9 PM. At the time, he states this was hot and bandlike in his epigastrium and was 10/10 in intensity. He noted some nausea at the time and on his way to the ED around 11 PM, he experienced 2 episodes of vomiting  which improved his nausea and pain. Since then his epigastric pain has improved. Currently he is experiencing some ~1/10 soreness in his lower abdomen and no nausea.  He reports a similar episode of epigastric pain in December of this year and evaluation indicated biliary cholic with gallstones. He was unable to tolerate lying supine at the time due to anxiety so did not undergo cholecystectomy.  He reports that he currently takes one 81 mg Aspirin every day but is not on any other blood thinners. He took Plavix for several years after his cardiac cath but has not taken this in over a year.  Pt denies fever, body aches, chills, chest pain, palpitations, changes in skin or eye color, shortness of breath, or cough.  Past Medical History:  Diagnosis Date  . Allergy   . GERD (gastroesophageal reflux disease)   . Hyperlipidemia   . Hypertension   . Myocardial infarction (HCC)    X 2 stents    Past Surgical History:  Procedure Laterality Date  . CARDIAC CATHETERIZATION     X 2 stents  . FRACTURE SURGERY     1967 wore cast    Family History  Problem Relation Age of Onset  . Asthma Mother   . Hearing loss Mother   . Heart disease Father   . Diabetes Brother   . Diabetes Maternal Grandmother   . Colon cancer Neg Hx   . Colon polyps Neg Hx     Social History:  reports that he quit smoking about 38 years ago. He has never used smokeless tobacco. He reports that he does not drink   alcohol and does not use drugs.  Allergies: No Known Allergies  Medications: I have personally reviewed this patient's medications. Current Facility-Administered Medications  Medication Dose Route Frequency Provider Last Rate Last Admin  . 0.9 %  sodium chloride infusion   Intravenous Continuous Ortiz, David Manuel, MD 150 mL/hr at 05/18/20 1052 New Bag at 05/18/20 1052  . [MAR Hold] cefTRIAXone (ROCEPHIN) 2 g in sodium chloride 0.9 % 100 mL IVPB  2 g Intravenous Q24H Gherghe, Costin M, MD      . [MAR Hold]  HYDROmorphone (DILAUDID) injection 1 mg  1 mg Intravenous Q4H PRN Ortiz, David Manuel, MD   1 mg at 05/18/20 0530  . [MAR Hold] metoprolol tartrate (LOPRESSOR) injection 5 mg  5 mg Intravenous Q8H Ortiz, David Manuel, MD      . [MAR Hold] ondansetron (ZOFRAN) tablet 4 mg  4 mg Oral Q6H PRN Ortiz, David Manuel, MD       Or  . [MAR Hold] ondansetron (ZOFRAN) injection 4 mg  4 mg Intravenous Q6H PRN Ortiz, David Manuel, MD      . [MAR Hold] pantoprazole (PROTONIX) injection 40 mg  40 mg Intravenous Q24H Ortiz, David Manuel, MD   40 mg at 05/18/20 1048   Results for orders placed or performed during the hospital encounter of 05/18/20 (from the past 48 hour(s))  Urinalysis, Routine w reflex microscopic Urine, Clean Catch     Status: Abnormal   Collection Time: 05/18/20  1:11 AM  Result Value Ref Range   Color, Urine YELLOW YELLOW   APPearance HAZY (A) CLEAR   Specific Gravity, Urine 1.018 1.005 - 1.030   pH 6.0 5.0 - 8.0   Glucose, UA NEGATIVE NEGATIVE mg/dL   Hgb urine dipstick NEGATIVE NEGATIVE   Bilirubin Urine NEGATIVE NEGATIVE   Ketones, ur NEGATIVE NEGATIVE mg/dL   Protein, ur NEGATIVE NEGATIVE mg/dL   Nitrite NEGATIVE NEGATIVE   Leukocytes,Ua NEGATIVE NEGATIVE    Comment: Performed at [facility], [address] [place], [place] [address]  Lipase, blood     Status: Abnormal   Collection Time: 05/18/20  2:27 AM  Result Value Ref Range   Lipase 1,868 (H) 11 - 51 U/L    Comment: RESULTS CONFIRMED BY MANUAL DILUTION Performed at [facility], [address] [place], [place] [address]   Comprehensive metabolic panel     Status: Abnormal   Collection Time: 05/18/20  2:27 AM  Result Value Ref Range   Sodium 136 135 - 145 mmol/L   Potassium 4.4 3.5 - 5.1 mmol/L   Chloride 98 98 - 111 mmol/L   CO2 27 22 - 32 mmol/L   Glucose, Bld 174 (H) 70 - 99 mg/dL    Comment: Glucose reference range applies only to samples taken after fasting for at least 8 hours.   BUN 18 8 - 23 mg/dL    Creatinine, Ser 0.77 0.61 - 1.24 mg/dL   Calcium 9.7 8.9 - 10.3 mg/dL   Total Protein 7.4 6.5 - 8.1 g/dL   Albumin 4.4 3.5 - 5.0 g/dL   AST 373 (H) 15 - 41 U/L   ALT 273 (H) 0 - 44 U/L   Alkaline Phosphatase 157 (H) 38 - 126 U/L   Total Bilirubin 2.5 (H) 0.3 - 1.2 mg/dL   GFR calc non Af Amer >60 >60 mL/min   GFR calc Af Amer >60 >60 mL/min   Anion gap 11 5 - 15    Comment: Performed at [facility], [address] [place], [place] [address]  CBC     Status: Abnormal   Collection Time: 05/18/20  2:27 AM  Result Value Ref Range   WBC 10.6 (H) 4.0 - 10.5 K/uL   RBC 4.18 (L) 4.22 - 5.81 MIL/uL   Hemoglobin 12.7 (L) 13.0 - 17.0 g/dL   HCT 38.2 (L) 39 - 52 %   MCV 91.4 80.0 - 100.0 fL   MCH 30.4 26.0 - 34.0 pg   MCHC 33.2 30.0 - 36.0 g/dL   RDW 13.6 11.5 - 15.5 %   Platelets 159 150 - 400 K/uL   nRBC 0.0 0.0 - 0.2 %    Comment: Performed at [facility], [address] [place], [place] [address]  Troponin I (High Sensitivity)     Status: None   Collection Time: 05/18/20  2:27 AM  Result Value Ref Range   Troponin I (High Sensitivity) 7 <18 ng/L    Comment: (NOTE) Elevated high sensitivity troponin I (hsTnI) values and significant  changes across serial measurements may suggest ACS but many other  chronic and acute conditions are known to elevate hsTnI results.  Refer to the Links section for chest pain algorithms and additional  guidance. Performed at [facility], [address] [place], [place] [address]   SARS Coronavirus 2 by RT PCR (hospital order, performed in [facility] hospital lab) Nasopharyngeal Nasopharyngeal Swab     Status: None   Collection Time: 05/18/20  4:56 AM   Specimen: Nasopharyngeal Swab  Result Value Ref Range   SARS Coronavirus 2 NEGATIVE NEGATIVE    Comment: (NOTE) SARS-CoV-2 target nucleic acids are NOT DETECTED.  The SARS-CoV-2 RNA is generally detectable in upper and lower respiratory specimens during the acute phase of infection. The  lowest concentration of SARS-CoV-2 viral copies this assay can detect is 250 copies / mL. A negative result does not preclude SARS-CoV-2 infection and should not be used as the sole basis for treatment or other patient management decisions.  A negative result may occur with improper specimen collection / handling, submission of specimen other than nasopharyngeal swab, presence of viral mutation(s) within the areas targeted by this assay, and inadequate number of viral copies (<250 copies / mL). A negative result must be combined with clinical observations, patient history, and epidemiological information.  Fact Sheet for Patients:   https://www.fda.gov/media/136312/download  Fact Sheet for Healthcare Providers: https://www.fda.gov/media/136313/download  This test is not yet approved or  cleared by the [place] FDA and has been authorized for detection and/or diagnosis of SARS-CoV-2 by FDA under an Emergency Use Authorization (EUA).  This EUA will remain in effect (meaning this test can be used) for the duration of the COVID-19 declaration under Section 564(b)(1) of the Act, 21 U.S.C. section 360bbb-3(b)(1), unless the authorization is terminated or revoked sooner.  Performed at [facility], [address] [place], [place] [address]   Troponin I (High Sensitivity)     Status: None   Collection Time: 05/18/20  5:19 AM  Result Value Ref Range   Troponin I (High Sensitivity) 7 <18 ng/L    Comment: (NOTE) Elevated high sensitivity troponin I (hsTnI) values and significant  changes across serial measurements may suggest ACS but many other  chronic and acute conditions are known to elevate hsTnI results.  Refer to the "Links" section for chest pain algorithms and additional  guidance. Performed at [facility], [address] [place], [place] [address]     CT ABDOMEN PELVIS W CONTRAST  Result Date:   05/18/2020 CLINICAL DATA:  Abdominal pain with biliary obstruction suspected.  EXAM: CT ABDOMEN AND PELVIS WITH CONTRAST TECHNIQUE: Multidetector CT imaging of the abdomen and pelvis was performed using the standard protocol following bolus administration of intravenous contrast. CONTRAST:  147m OMNIPAQUE IOHEXOL 300 MG/ML  SOLN COMPARISON:  12/01/2019 FINDINGS: Lower chest:  Coronary and aortic atherosclerosis.  Mild atelectasis Hepatobiliary: No focal liver abnormality.No evidence of biliary obstruction or stone. Nodular high-density at the ampulla measuring 9 mm, noncalcified. The upstream CBD is notably not dilated, which favors acute obstruction from calculus rather than mass. Pancreas: Regional retroperitoneal edema but no clear parenchymal expansion and no pancreatic collection. Spleen: Unremarkable. Adrenals/Urinary Tract: Negative adrenals. No hydronephrosis or stone. Small low-density renal lesions that are too small for accurate densitometry. Unremarkable bladder. Stomach/Bowel: No obstruction. Upper retroperitoneal edema which is in proximity to the distal duodenum, which shows no wall thickening or discrete ulceration. Vascular/Lymphatic: No acute vascular abnormality. Widespread atheromatous calcification. No mass or adenopathy. Reproductive:No pathologic findings. Other: No ascites or pneumoperitoneum.  Fatty umbilical hernia. Musculoskeletal: No acute abnormalities. Diffuse degenerative disease with mild lumbar levoscoliosis. IMPRESSION: 1. Epigastric retroperitoneal edema compatible with the acute pancreatitis by labs. No collection or necrosis. 2. Nodular high-density at the ampulla of the CBD without ductal dilatation, suspicious for choledocholithiasis. Suggest MRCP. Electronically Signed   By: JMonte FantasiaM.D.   On: 05/18/2020 04:47   MR 3D Recon At Scanner  Result Date: 05/18/2020 CLINICAL DATA:  Severe pancreatitis, right upper quadrant pain, nausea, vomiting, choledocholithiasis suspected on prior CT EXAM: MRI ABDOMEN WITHOUT AND WITH CONTRAST (INCLUDING MRCP)  TECHNIQUE: Multiplanar multisequence MR imaging of the abdomen was performed both before and after the administration of intravenous contrast. Heavily T2-weighted images of the biliary and pancreatic ducts were obtained, and three-dimensional MRCP images were rendered by post processing. CONTRAST:  744mGADAVIST GADOBUTROL 1 MMOL/ML IV SOLN COMPARISON:  CT abdomen pelvis, 05/18/2020 FINDINGS: Lower chest: No acute findings. Hepatobiliary: No mass or other parenchymal abnormality identified. Multiple small gallstones and/or polyps in the gallbladder. There is mild enlargement of the common bile duct, measuring up to 8 mm, and there is a possible, although not definite small gallstone at the ampulla measuring approximately 3 mm (series 15, image 19). Pancreas: There is inflammatory fat stranding centered about the pancreatic head and adjacent retroperitoneum. There is no evidence of parenchymal necrosis or acute pancreatic fluid collection. No pancreatic ductal dilatation Spleen:  Within normal limits in size and appearance. Adrenals/Urinary Tract: No masses identified. Nonenhancing exophytic cyst of the anterior midportion of the left kidney. No evidence of hydronephrosis. Stomach/Bowel: Visualized portions within the abdomen are unremarkable. Vascular/Lymphatic: No pathologically enlarged lymph nodes identified. No abdominal aortic aneurysm demonstrated. Other:  None. Musculoskeletal: No suspicious bone lesions identified. IMPRESSION: 1. Inflammatory fat stranding centered about the pancreatic head and adjacent retroperitoneum, consistent with acute pancreatitis. No evidence of parenchymal necrosis or acute pancreatic fluid collection. 2. There is a possible gallstone no larger than 3 mm at the ampulla. Mild enlargement of the common bile duct, measuring up to 8 mm. 3. Additional small gallstones and/or polyps in the gallbladder. Electronically Signed   By: AlEddie Candle.D.   On: 05/18/2020 08:30   MR ABDOMEN MRCP  W WO CONTAST  Result Date: 05/18/2020 CLINICAL DATA:  Severe pancreatitis, right upper quadrant pain, nausea, vomiting, choledocholithiasis suspected on prior CT EXAM: MRI ABDOMEN WITHOUT AND WITH CONTRAST (INCLUDING MRCP) TECHNIQUE: Multiplanar multisequence MR imaging of the abdomen was performed both before  and after the administration of intravenous contrast. Heavily T2-weighted images of the biliary and pancreatic ducts were obtained, and three-dimensional MRCP images were rendered by post processing. CONTRAST:  7mL GADAVIST GADOBUTROL 1 MMOL/ML IV SOLN COMPARISON:  CT abdomen pelvis, 05/18/2020 FINDINGS: Lower chest: No acute findings. Hepatobiliary: No mass or other parenchymal abnormality identified. Multiple small gallstones and/or polyps in the gallbladder. There is mild enlargement of the common bile duct, measuring up to 8 mm, and there is a possible, although not definite small gallstone at the ampulla measuring approximately 3 mm (series 15, image 19). Pancreas: There is inflammatory fat stranding centered about the pancreatic head and adjacent retroperitoneum. There is no evidence of parenchymal necrosis or acute pancreatic fluid collection. No pancreatic ductal dilatation Spleen:  Within normal limits in size and appearance. Adrenals/Urinary Tract: No masses identified. Nonenhancing exophytic cyst of the anterior midportion of the left kidney. No evidence of hydronephrosis. Stomach/Bowel: Visualized portions within the abdomen are unremarkable. Vascular/Lymphatic: No pathologically enlarged lymph nodes identified. No abdominal aortic aneurysm demonstrated. Other:  None. Musculoskeletal: No suspicious bone lesions identified. IMPRESSION: 1. Inflammatory fat stranding centered about the pancreatic head and adjacent retroperitoneum, consistent with acute pancreatitis. No evidence of parenchymal necrosis or acute pancreatic fluid collection. 2. There is a possible gallstone no larger than 3 mm at the  ampulla. Mild enlargement of the common bile duct, measuring up to 8 mm. 3. Additional small gallstones and/or polyps in the gallbladder. Electronically Signed   By: Alex  Bibbey M.D.   On: 05/18/2020 08:30    ROS:  Pertinent items are noted in HPI.  Blood pressure 137/73, pulse (!) 104, temperature 98.2 F (36.8 C), temperature source Oral, resp. rate 14, height 5' 8" (1.727 m), weight 68.5 kg, SpO2 96 %. Physical Exam:  Vitals and nursing note reviewed.  Constitutional: no acute distress, lying comfortably in bed Head: atraumatic ENT: external ears normal Eyes: EOMI, mild scleral icterus Cardiovascular: regular rate and rhythm, normal heart sounds Pulmonary: effort normal, bilateral mid-lower lobe inspiratory crackles Abdominal: flat, tenderness to deep palpation below right costal margin, TTP lower abdomen and midline epigastrium, no rebound tenderness, bowel sounds normal, reducible, nontender umbilical hernia with no overlying skin changes Skin: warm and dry Neurological: alert, no focal deficit Psychiatric: normal mood and affect  Assessment/Plan: Choledocholithiasis with acute gallstone pancreatitis Pt feeling well on exam with appropriate pain management. MRCP shows multiple gallstones in the gallbladder and small stone noted at the ampulla vater. Alk phos 157, AST 373, ALT 273, T bili 2.5, and mild dilation of CBD likely indicate some degree of obstruction at this time. Pt is afebrile with only mild RUQ at this time and no leukocytosis. Likely not cholangitis but high possibility of progression. Additionally, lipase significantly elevated at 1868 indicating severe pancreatitis likely secondary to gallstone obstruction. Will receive ERCP today. Continue IVF, abx and f/u with cholecystectomy tomorrow pending ERCP. -NPO -IV NS 150 mL/hr -IV Ceftriaxone 2g QD -Dilaudid 1mg q4h -Consider cholecystectomy tomorrow pending results of ERCP today   Randy Thomas 05/18/2020, 11:27 AM      

## 2020-05-18 NOTE — Clinical Social Work Note (Signed)
CSW completed [facility] Notification. Notification ID is: [id number].  Tobi Bastos, LCSW Transitions of Care Clinical Social Worker [facility] Ph: [phone]

## 2020-05-18 NOTE — Consult Note (Signed)
Referring Provider: Marzetta Board and Blake Divine, MD Primary Care Physician:  Susy Frizzle, MD Primary Gastroenterologist:  Dr. Laural Golden  Reason for Consultation:    Biliary pancreatitis. Patient has common duct stone.  HPI:   Patient is 84 year old Caucasian male with history of hypertension, hyperlipidemia and coronary artery disease status post coronary stenting back in 2006 who experienced abdominal pain back in December 2020 when he was seen in emergency room.  CT revealed calcified gallstones and stone in neck of gallbladder.  Patient was evaluated by Dr. Arnoldo Morale of surgical service.  He recommended cholecystectomy.  Patient was scheduled to undergo cholecystectomy on the same day but he opted out at the last minute because of anxiety.  Apparently he was unable to lie supine.  Since then patient has had few more episodes of mild pain. He experience epigastric and right upper quadrant pain around 4 PM yesterday without other symptoms.  Pain subsided within an hour.  He and his son went to a [employer] and he had vegetable dinner.  About 3 hours later around 9 PM he had excruciating pain in epigastric and right upper quadrant.  He was brought back to emergency room and he vomited twice in route to emergency room.  While in the emergency room he has been receiving IV fluids.  He had lab studies and bilirubin and transaminases are elevated.  WBC was 10.6.  Serum lipase was 1868.  Patient was begun on IV fluids.  He underwent abdominal pelvic CT which revealed changes of pancreatitis and 9 mm nodular density in ampullary region consistent with common duct stone.  Subsequent MRCP revealed changes of of acute pancreatitis, cholelithiasis, mildly dilated CBD measuring 8 mm and single calcified stone in distal CBD. Surgery was consulted and recommended GI evaluation for ERCP prior to cholecystectomy. Patient states he has very mild discomfort in right upper quadrant.  He has not had fever  chills nausea vomiting or chest pain since he has been in emergency room. He recalls he had diarrhea lasting for about 6 weeks back in March 20 loss 15 to 20 pounds.  He has gained most of his weight back.  His son Joneen Boers who is at bedside states he was given medication possibly an antibiotic and he got better. Patient is on low-dose aspirin.  Last dose was yesterday. He denies chest pain or shortness of breath.  Patient stated he had coronary stenting back in 2006.  He has not had any problems since then. Patient is retired.  He is married.  He has 3 daughters and 1 son.  His son has diabetes mellitus.  Patient quit drinking alcohol in 1969 and he has not had any cigarettes since early 80s. Both parents lived to be 29.  He has sister who is in her 38s not doing well.  Another sister died in her 7s.  He is not sure as to the cause.  Another brother lived to be in his 39s.  He had diabetic complications    Past Medical History:  Diagnosis Date   Allergy    GERD (gastroesophageal reflux disease)    Hyperlipidemia    Hypertension    Myocardial infarction ([place])    X 2 stents    Past Surgical History:  Procedure Laterality Date   CARDIAC CATHETERIZATION     X 2 stents   FRACTURE SURGERY     1967 wore cast    Prior to Admission medications   Medication Sig Start Date End Date Taking?  Authorizing Provider  acetaminophen (TYLENOL ARTHRITIS PAIN) 650 MG CR tablet Take 2 tablets (1,300 mg total) by mouth every 8 (eight) hours as needed. For pain 11/19/16  Yes Orlena Sheldon, PA-C  Ascorbic Acid (VITAMIN C) 1000 MG tablet Take 1,000 mg by mouth daily.   Yes [provider]  aspirin EC 81 MG tablet Take 1 tablet (81 mg total) by mouth daily. 11/19/16  Yes Orlena Sheldon, PA-C  cholecalciferol (VITAMIN D) 1000 units tablet Take 1 tablet (1,000 Units total) by mouth daily. 11/19/16  Yes Orlena Sheldon, PA-C  HYDROcodone-acetaminophen (NORCO/VICODIN) 5-325 MG tablet Take 1 tablet by mouth  every 6 (six) hours as needed. 10/03/19  Yes Susy Frizzle, MD  methocarbamol (ROBAXIN) 750 MG tablet Take 1 tablet (750 mg total) by mouth 4 (four) times daily. Patient taking differently: Take 750 mg by mouth 4 (four) times daily. As needed 05/03/19  Yes Susy Frizzle, MD  metoprolol succinate (TOPROL-XL) 50 MG 24 hr tablet TAKE 1/2 TABLET BY MOUTH IN THE MORNING AND 1/2 TABLET BY MOUTH IN THE EVENING 11/19/16  Yes Orlena Sheldon, PA-C  Multiple Vitamin (MULTIVITAMIN WITH MINERALS) TABS tablet Take 1 tablet by mouth daily. 11/19/16  Yes Dena Billet B, PA-C  Omega-3 Fatty Acids (FISH OIL) 1000 MG CAPS Take 2 capsules by mouth 2 (two) times daily.    Yes [provider]  pantoprazole (PROTONIX) 40 MG tablet Take 40 mg by mouth daily.   Yes [provider]  VASCEPA 1 g capsule Take 1 capsule by mouth in the morning and at bedtime. 03/12/20  Yes [provider]  vitamin B-12 (CYANOCOBALAMIN) 50 MCG tablet Take 50 mcg by mouth daily.   Yes [provider]  cetirizine (ZYRTEC) 10 MG tablet Take 1 tablet (10 mg total) by mouth daily. Patient not taking: Reported on 05/18/2020 05/03/19   Susy Frizzle, MD  lansoprazole (PREVACID) 30 MG capsule Take 1 capsule (30 mg total) by mouth 2 (two) times daily. Patient not taking: Reported on 05/18/2020 11/19/16   Orlena Sheldon, PA-C  neomycin-bacitracin-polymyxin (NEOSPORIN) [phone] ointment Apply topically 3 (three) times daily. To the affected eyelid Patient not taking: Reported on 05/18/2020 05/01/20   Annie Main, FNP  rosuvastatin (CRESTOR) 40 MG tablet Take 1 tablet (40 mg total) by mouth at bedtime. Patient not taking: Reported on 05/18/2020 11/19/16   Dena Billet B, PA-C    Current Facility-Administered Medications  Medication Dose Route Frequency Provider Last Rate Last Admin   0.9 %  sodium chloride infusion   Intravenous Continuous Rogene Houston, MD 150 mL/hr at 05/18/20 1052 New Bag at 05/18/20 1052    HYDROmorphone (DILAUDID) injection 1 mg  1 mg Intravenous Q4H PRN Reubin Milan, MD   1 mg at 05/18/20 0530   metoprolol tartrate (LOPRESSOR) injection 5 mg  5 mg Intravenous Q8H Reubin Milan, MD       ondansetron [facility]) tablet 4 mg  4 mg Oral Q6H PRN Reubin Milan, MD       Or   ondansetron [facility]) injection 4 mg  4 mg Intravenous Q6H PRN Reubin Milan, MD       pantoprazole (PROTONIX) injection 40 mg  40 mg Intravenous Q24H Reubin Milan, MD   40 mg at 05/18/20 1048   Current Outpatient Medications  Medication Sig Dispense Refill   acetaminophen (TYLENOL ARTHRITIS PAIN) 650 MG CR tablet Take 2 tablets (1,300 mg total) by mouth  every 8 (eight) hours as needed. For pain 120 tablet 2   Ascorbic Acid (VITAMIN C) 1000 MG tablet Take 1,000 mg by mouth daily.     aspirin EC 81 MG tablet Take 1 tablet (81 mg total) by mouth daily. 30 tablet 3   cholecalciferol (VITAMIN D) 1000 units tablet Take 1 tablet (1,000 Units total) by mouth daily. 30 tablet 3   HYDROcodone-acetaminophen (NORCO/VICODIN) 5-325 MG tablet Take 1 tablet by mouth every 6 (six) hours as needed. 15 tablet 0   methocarbamol (ROBAXIN) 750 MG tablet Take 1 tablet (750 mg total) by mouth 4 (four) times daily. (Patient taking differently: Take 750 mg by mouth 4 (four) times daily. As needed) 60 tablet 2   metoprolol succinate (TOPROL-XL) 50 MG 24 hr tablet TAKE 1/2 TABLET BY MOUTH IN THE MORNING AND 1/2 TABLET BY MOUTH IN THE EVENING 60 tablet 4   Multiple Vitamin (MULTIVITAMIN WITH MINERALS) TABS tablet Take 1 tablet by mouth daily. 30 tablet 3   Omega-3 Fatty Acids (FISH OIL) 1000 MG CAPS Take 2 capsules by mouth 2 (two) times daily.      pantoprazole (PROTONIX) 40 MG tablet Take 40 mg by mouth daily.     VASCEPA 1 g capsule Take 1 capsule by mouth in the morning and at bedtime.     vitamin B-12 (CYANOCOBALAMIN) 50 MCG tablet Take 50 mcg by mouth daily.     cetirizine (ZYRTEC) 10 MG  tablet Take 1 tablet (10 mg total) by mouth daily. (Patient not taking: Reported on 05/18/2020) 30 tablet 11   lansoprazole (PREVACID) 30 MG capsule Take 1 capsule (30 mg total) by mouth 2 (two) times daily. (Patient not taking: Reported on 05/18/2020) 60 capsule 3   neomycin-bacitracin-polymyxin (NEOSPORIN) [phone] ointment Apply topically 3 (three) times daily. To the affected eyelid (Patient not taking: Reported on 05/18/2020) 28.3 g 0   rosuvastatin (CRESTOR) 40 MG tablet Take 1 tablet (40 mg total) by mouth at bedtime. (Patient not taking: Reported on 05/18/2020) 90 tablet 0    Allergies as of 05/18/2020   (No Known Allergies)    Family History  Problem Relation Age of Onset   Asthma Mother    Hearing loss Mother    Heart disease Father    Diabetes Brother    Diabetes Maternal Grandmother    Colon cancer Neg Hx    Colon polyps Neg Hx     Social History   Socioeconomic History   Marital status: Married    Spouse name: Not on file   Number of children: Not on file   Years of education: Not on file   Highest education level: Not on file  Occupational History   Occupation: retired    Comment: truck driver  Tobacco Use   Smoking status: Former Smoker    Quit date: 09/15/1981    Years since quitting: 38.6   Smokeless tobacco: Never Used  Substance and Sexual Activity   Alcohol use: No    Alcohol/week: 0.0 standard drinks   Drug use: No   Sexual activity: Not Currently  Other Topics Concern   Not on file  Social History Narrative   Not on file   Social Determinants of Health   Financial Resource Strain:    Difficulty of Paying Living Expenses: Not on file  Food Insecurity:    Worried About [place] in the Last Year: Not on file   [employer] of Food in the Last Year: Not on file  Transportation Needs:    Film/video editor (Medical): Not on file   Lack of Transportation (Non-Medical): Not on file  Physical Activity:    Days of  Exercise per Week: Not on file   Minutes of Exercise per Session: Not on file  Stress:    Feeling of Stress : Not on file  Social Connections:    Frequency of Communication with Friends and Family: Not on file   Frequency of Social Gatherings with Friends and Family: Not on file   Attends Religious Services: Not on file   Active Member of Clubs or Organizations: Not on file   Attends Archivist Meetings: Not on file   Marital Status: Not on file  Intimate Partner Violence:    Fear of Current or Ex-Partner: Not on file   Emotionally Abused: Not on file   Physically Abused: Not on file   Sexually Abused: Not on file    Review of Systems: See HPI, otherwise normal ROS  Physical Exam: Temp:  [98.4 F (36.9 C)-98.6 F (37 C)] 98.4 F (36.9 C) (09/03 0843) Pulse Rate:  [71-107] 107 (09/03 1030) Resp:  [11-20] 16 (09/03 0843) BP: (117-156)/(53-103) 137/78 (09/03 1030) SpO2:  [91 %-100 %] 96 % (09/03 1030) Weight:  [68.5 kg] 68.5 kg (09/03 0112)   Patient is alert and in no acute distress. Conjunctiva is pain.  Sclerae nonicteric. Oropharyngeal mucosa is normal.  He has upper dental plate.  He is edentulous and lower jaw. No neck masses or thyromegaly noted. Cardiac exam with regular rhythm normal S1 and S2.  No murmur gallop noted. Auscultation lungs reveal vesicular breath sounds bilaterally. Abdomen is full.  He has small umbilical hernia which is reducible.  Bowel sounds are normal.  On palpation abdomen is soft.  He has mild tenderness in epigastric region and below the right costal margin on deep palpation.  No organomegaly or masses. He does not have peripheral edema.  Lab Results: Recent Labs    05/18/20 0227  WBC 10.6*  HGB 12.7*  HCT 38.2*  PLT 159   BMET Recent Labs    05/18/20 0227  NA 136  K 4.4  CL 98  CO2 27  GLUCOSE 174*  BUN 18  CREATININE 0.77  CALCIUM 9.7   LFT Recent Labs    05/18/20 0227  PROT 7.4  ALBUMIN 4.4   AST 373*  ALT 273*  ALKPHOS 157*  BILITOT 2.5*   SARS coronavirus 2 test negative.  Studies/Results: CT ABDOMEN PELVIS W CONTRAST  Result Date: 05/18/2020 CLINICAL DATA:  Abdominal pain with biliary obstruction suspected. EXAM: CT ABDOMEN AND PELVIS WITH CONTRAST TECHNIQUE: Multidetector CT imaging of the abdomen and pelvis was performed using the standard protocol following bolus administration of intravenous contrast. CONTRAST:  129mL OMNIPAQUE IOHEXOL 300 MG/ML  SOLN COMPARISON:  12/01/2019 FINDINGS: Lower chest:  Coronary and aortic atherosclerosis.  Mild atelectasis Hepatobiliary: No focal liver abnormality.No evidence of biliary obstruction or stone. Nodular high-density at the ampulla measuring 9 mm, noncalcified. The upstream CBD is notably not dilated, which favors acute obstruction from calculus rather than mass. Pancreas: Regional retroperitoneal edema but no clear parenchymal expansion and no pancreatic collection. Spleen: Unremarkable. Adrenals/Urinary Tract: Negative adrenals. No hydronephrosis or stone. Small low-density renal lesions that are too small for accurate densitometry. Unremarkable bladder. Stomach/Bowel: No obstruction. Upper retroperitoneal edema which is in proximity to the distal duodenum, which shows no wall thickening or discrete ulceration. Vascular/Lymphatic: No acute vascular abnormality. Widespread atheromatous calcification. No  mass or adenopathy. Reproductive:No pathologic findings. Other: No ascites or pneumoperitoneum.  Fatty umbilical hernia. Musculoskeletal: No acute abnormalities. Diffuse degenerative disease with mild lumbar levoscoliosis. IMPRESSION: 1. Epigastric retroperitoneal edema compatible with the acute pancreatitis by labs. No collection or necrosis. 2. Nodular high-density at the ampulla of the CBD without ductal dilatation, suspicious for choledocholithiasis. Suggest MRCP. Electronically Signed   By: Monte Fantasia M.D.   On: 05/18/2020 04:47   MR  3D Recon At Scanner  Result Date: 05/18/2020 CLINICAL DATA:  Severe pancreatitis, right upper quadrant pain, nausea, vomiting, choledocholithiasis suspected on prior CT EXAM: MRI ABDOMEN WITHOUT AND WITH CONTRAST (INCLUDING MRCP) TECHNIQUE: Multiplanar multisequence MR imaging of the abdomen was performed both before and after the administration of intravenous contrast. Heavily T2-weighted images of the biliary and pancreatic ducts were obtained, and three-dimensional MRCP images were rendered by post processing. CONTRAST:  50mL GADAVIST GADOBUTROL 1 MMOL/ML IV SOLN COMPARISON:  CT abdomen pelvis, 05/18/2020 FINDINGS: Lower chest: No acute findings. Hepatobiliary: No mass or other parenchymal abnormality identified. Multiple small gallstones and/or polyps in the gallbladder. There is mild enlargement of the common bile duct, measuring up to 8 mm, and there is a possible, although not definite small gallstone at the ampulla measuring approximately 3 mm (series 15, image 19). Pancreas: There is inflammatory fat stranding centered about the pancreatic head and adjacent retroperitoneum. There is no evidence of parenchymal necrosis or acute pancreatic fluid collection. No pancreatic ductal dilatation Spleen:  Within normal limits in size and appearance. Adrenals/Urinary Tract: No masses identified. Nonenhancing exophytic cyst of the anterior midportion of the left kidney. No evidence of hydronephrosis. Stomach/Bowel: Visualized portions within the abdomen are unremarkable. Vascular/Lymphatic: No pathologically enlarged lymph nodes identified. No abdominal aortic aneurysm demonstrated. Other:  None. Musculoskeletal: No suspicious bone lesions identified. IMPRESSION: 1. Inflammatory fat stranding centered about the pancreatic head and adjacent retroperitoneum, consistent with acute pancreatitis. No evidence of parenchymal necrosis or acute pancreatic fluid collection. 2. There is a possible gallstone no larger than 3 mm at  the ampulla. Mild enlargement of the common bile duct, measuring up to 8 mm. 3. Additional small gallstones and/or polyps in the gallbladder. Electronically Signed   By: Eddie Candle M.D.   On: 05/18/2020 08:30   MR ABDOMEN MRCP W WO CONTAST  Result Date: 05/18/2020 CLINICAL DATA:  Severe pancreatitis, right upper quadrant pain, nausea, vomiting, choledocholithiasis suspected on prior CT EXAM: MRI ABDOMEN WITHOUT AND WITH CONTRAST (INCLUDING MRCP) TECHNIQUE: Multiplanar multisequence MR imaging of the abdomen was performed both before and after the administration of intravenous contrast. Heavily T2-weighted images of the biliary and pancreatic ducts were obtained, and three-dimensional MRCP images were rendered by post processing. CONTRAST:  40mL GADAVIST GADOBUTROL 1 MMOL/ML IV SOLN COMPARISON:  CT abdomen pelvis, 05/18/2020 FINDINGS: Lower chest: No acute findings. Hepatobiliary: No mass or other parenchymal abnormality identified. Multiple small gallstones and/or polyps in the gallbladder. There is mild enlargement of the common bile duct, measuring up to 8 mm, and there is a possible, although not definite small gallstone at the ampulla measuring approximately 3 mm (series 15, image 19). Pancreas: There is inflammatory fat stranding centered about the pancreatic head and adjacent retroperitoneum. There is no evidence of parenchymal necrosis or acute pancreatic fluid collection. No pancreatic ductal dilatation Spleen:  Within normal limits in size and appearance. Adrenals/Urinary Tract: No masses identified. Nonenhancing exophytic cyst of the anterior midportion of the left kidney. No evidence of hydronephrosis. Stomach/Bowel: Visualized portions within the abdomen are  unremarkable. Vascular/Lymphatic: No pathologically enlarged lymph nodes identified. No abdominal aortic aneurysm demonstrated. Other:  None. Musculoskeletal: No suspicious bone lesions identified. IMPRESSION: 1. Inflammatory fat stranding  centered about the pancreatic head and adjacent retroperitoneum, consistent with acute pancreatitis. No evidence of parenchymal necrosis or acute pancreatic fluid collection. 2. There is a possible gallstone no larger than 3 mm at the ampulla. Mild enlargement of the common bile duct, measuring up to 8 mm. 3. Additional small gallstones and/or polyps in the gallbladder. Electronically Signed   By: Eddie Candle M.D.   On: 05/18/2020 08:30   I have reviewed abdominal pelvic CT and MR.  Findings as above.  Assessment;  Patient is 84 year old Caucasian male with known cholelithiasis who presents with biliary pancreatitis.  He has mildly dilated bile duct with single calcified stone.  Bilirubin and transaminases are elevated.  This stone is calcified and possibly may not pass spontaneously.  Therefore would be appropriate to proceed with ERCP prior to cholecystectomy.  Patient's acute pancreatitis appears to be mild based on parameters such as WBC serum calcium and renal function.  Recommendations;  Increase IV fluid rate to 150 mL/h. Ceftriaxone 1 g IV every 24 hours. ERCP with sphincterotomy and stone extraction later today..  Procedure and risks reviewed with the patient and his son Joneen Boers who was at bedside.  May consider pancreatic stenting if cannulation of bile duct is difficult. Patient is agreeable. Cholecystectomy timing per Dr. Blake Divine.    LOS: 0 days   Lundon Verdejo  05/18/2020, 10:53 AM

## 2020-05-18 NOTE — Op Note (Signed)
[facility] Patient Name: Randy Thomas Procedure Date: 05/18/2020 12:51 PM MRN: [id number] Date of Birth: March 27, 1936 Attending MD: Hildred Laser , MD CSN: [id number] Age: 84 Admit Type: Outpatient Procedure:                ERCP Indications:              Bile duct stone(s), Acute pancreatitis Providers:                Hildred Laser, MD, Caprice Kluver, Rosina Lowenstein, RN,                            Randa Spike, Technician Referring MD:             Marzetta Board, MD and Blake Divine, MD Medicines:                General Anesthesia Complications:            No immediate complications. Estimated Blood Loss:     Estimated blood loss: none. Estimated blood loss                            was minimal. Procedure:                Pre-Anesthesia Assessment:                           - Prior to the procedure, a History and Physical                            was performed, and patient medications and                            allergies were reviewed. The patient's tolerance of                            previous anesthesia was also reviewed. The risks                            and benefits of the procedure and the sedation                            options and risks were discussed with the patient.                            All questions were answered, and informed consent                            was obtained. Prior Anticoagulants: The patient has                            taken no previous anticoagulant or antiplatelet                            agents except for aspirin. ASA Grade Assessment:  III - A patient with severe systemic disease. After                            reviewing the risks and benefits, the patient was                            deemed in satisfactory condition to undergo the                            procedure.                           After obtaining informed consent, the scope was                            passed under direct  vision. Throughout the                            procedure, the patient's blood pressure, pulse, and                            oxygen saturations were monitored continuously. The                            [id number]-Q180V ([id number]) scope was introduced through                            the mouth, and used to inject contrast into and                            used to inject contrast into the bile duct. The                            ERCP was accomplished without difficulty. The                            patient tolerated the procedure well. Scope In: Scope Out: Findings:      The scout film was normal. The esophagus was successfully intubated       under direct vision. The scope was advanced to a normal major papilla in       the descending duodenum without detailed examination of the pharynx,       larynx and associated structures, and upper GI tract. The upper GI tract       was grossly normal. Erosion noted at ampullary orifice. The bile duct       was deeply cannulated with the Hydratome sphincterotome. Contrast was       injected. I personally interpreted the bile duct images. There was brisk       flow of contrast through the ducts. Image quality was excellent.       Contrast extended to the main bile duct. Contrast extended to the       bifurcation. Contrast extended to the hepatic ducts. The common bile       duct and common hepatic duct were mildly dilated and diffusely dilated.  There filling defect distall thought to be a stone. A 7 mm biliary       sphincterotomy was made with a braided Hydratome sphincterotome using       ERBE electrocautery. The sphincterotomy oozed blood. The biliary tree       was swept with a 10 mm balloon starting at the bifurcation. Sludge and       mucus was swept from the duct. There was no stone. Filling defects on       subsequent images were due to air bubbles. Impression:               - A filling defect consistent with a stone was seen                             on the cholangiogram.                           - The common bile duct and common hepatic duct were                            mildly dilated.                           - A biliary sphincterotomy was performed.                           - The biliary tree was swept and sludge and mucus                            were found. Moderate Sedation:      Per Anesthesia Care Recommendation:           - Avoid aspirin and anti-coagulants for 3 days.                           - NPO except ice chips and medications with sips of                            water.                           - Continue present medications.                           - CBC, CMET and lipase in am.                           - Lap. Cholcystectomy in 2 days as recommended by                            Dr. Constance Haw. Procedure Code(s):        --- Professional ---                           [id number], Endoscopic retrograde  cholangiopancreatography (ERCP); with removal of                            calculi/debris from biliary/pancreatic duct(s)                           [id number], Endoscopic retrograde                            cholangiopancreatography (ERCP); with                            sphincterotomy/papillotomy Diagnosis Code(s):        --- Professional ---                           K80.50, Calculus of bile duct without cholangitis                            or cholecystitis without obstruction                           K85.90, Acute pancreatitis without necrosis or                            infection, unspecified                           K83.8, Other specified diseases of biliary tract                           R93.2, Abnormal findings on diagnostic imaging of                            liver and biliary tract CPT copyright 2019 American Medical Association. All rights reserved. The codes documented in this report are preliminary and upon coder review may  be revised to meet  current compliance requirements. Hildred Laser, MD Hildred Laser, MD 05/18/2020 2:18:27 PM This report has been signed electronically. Number of Addenda: 0

## 2020-05-18 NOTE — Anesthesia Procedure Notes (Signed)
Procedure Name: Intubation Date/Time: 05/18/2020 1:13 PM Performed by: Jonna Munro, CRNA Pre-anesthesia Checklist: Patient identified, Emergency Drugs available, Suction available, Patient being monitored and Timeout performed Patient Re-evaluated:Patient Re-evaluated prior to induction Oxygen Delivery Method: Circle system utilized Preoxygenation: Pre-oxygenation with 100% oxygen Induction Type: IV induction Laryngoscope Size: Mac and 3 Grade View: Grade I Tube type: Oral Tube size: 7.5 mm Number of attempts: 1 Airway Equipment and Method: Stylet Placement Confirmation: ETT inserted through vocal cords under direct vision,  positive ETCO2 and breath sounds checked- equal and bilateral Secured at: 22 cm Tube secured with: Tape Dental Injury: Teeth and Oropharynx as per pre-operative assessment

## 2020-05-18 NOTE — Progress Notes (Signed)
Patient seen and examined this morning, admitted overnight, H&P reviewed and agree with assessment and plan.  84 year old male with history of hypertension, hyperlipidemia, CAD, prior history of MI with stent placement x2, comes to the ED with abdominal/right upper quadrant pain that started yesterday, associated with food.  His initial pain resolved however it recurred after dinner, and this was associated by nausea and couple episodes of emesis.  He has known cholelithiasis, has been followed with general surgery as an outpatient, and per prior notes patient has been trying to avoid cholecystectomy as long as possible.  In the ED he was found to have acute pancreatitis, elevated LFTs and a dilated CBD with concern for choledocholithiasis.   Patient is doing better this morning, states that his pain is better once he got pain medications.  No nausea or vomiting   Acute gallstone pancreatitis, LFT elevation-general surgery as well as gastroenterology consulted.  He underwent an MRCP which showed concern for choledocholithiasis.  He likely needs ERCP followed by cholecystectomy during this hospital stay.  For now continue n.p.o., IV fluids, his not on any blood thinners at home.  CAD -hold statin, aspirin, no chest pain and prior to this episode he was in his normal state of health, high-sensitivity troponin negative  Hyperlipidemia-hold statin due to elevated LFTs  Essential hypertension-metoprolol IV  Normocytic anemia-stable, monitor   Scheduled Meds: . metoprolol tartrate  5 mg Intravenous Q8H  . pantoprazole (PROTONIX) IV  40 mg Intravenous Q24H   Continuous Infusions: . sodium chloride     PRN Meds:.HYDROmorphone (DILAUDID) injection, ondansetron **OR** ondansetron (ZOFRAN) IV  Caterra Ostroff M. [name], MD, PhD [facility]  Between 7 am - 7 pm you can contact me via [place] or [place].  I am not available 7 pm - 7 am, please contact night coverage MD/APP via Amion

## 2020-05-18 NOTE — H&P (Signed)
History and Physical    Randy Thomas XLK:[id number] DOB: 09/04/1936 DOA: 05/18/2020  PCP: Susy Frizzle, MD   Patient coming from: Home.   I have personally briefly reviewed patient's old medical records in [place]  Chief Complaint: Abdominal pain and vomiting.  HPI: Randy Thomas is a 84 y.o. male with medical history significant of allergies, GERD, hyperlipidemia, hypertension, CAD, history of MI, history of stent placement x2 who is coming to the emergency department with complaints of abdominal pain and RUQ/epigastric area that started at 1600 yesterday  associated with nausea and 2 episodes of emesis later in the evening.  The patient states that he ate a vegetable omelette for brunch yesterday around 11 or 11:30 in the morning.  He did not have any symptoms until he developed acute epigastric/RUQ pain that slowly improved over an hour and then resolve to the point that he was able to eat mashed potatoes with no gravy and vegetables.  Around 2230, he developed pain again, this time it was associated with nausea and 2 episodes of emesis.  In March of this year, he saw Dr. Arnoldo Morale for a similar pain and was diagnosed with cholelithiasis.  He denies hematemesis, diarrhea, constipation, melena or hematochezia.  No flank or suprapubic pain, dysuria, frequency or hematuria.  He denied fever, rhinorrhea, sore throat, dyspnea, wheezing or hemoptysis.  No chest pain, palpitations, diaphoresis, PND, orthopnea or pitting edema of the lower extremities.  He denies polyuria, polydipsia, polyphagia or blurred vision.  ED Course: Initial vital signs were temperature 98.6 F, pulse 71, respiration 18, blood pressure 152/103 mmHg and O2 sat 100% on room air.  The patient received 2000 mL of NS bolus, 25 mcg of fentanyl x1 and 4 mg of ondansetron IVP x1.  Labs: Urinalysis unremarkable.  CBC showed a white count of 10.6, hemoglobin 12.7 g/dL and platelets 159.  CMP shows normal electrolytes and  renal function.  Glucose 174 and total bilirubin 2.5 mg/dL.  AST 373, ALT 273 and alkaline phosphatase 157 units/L.  Total protein and albumin level.  Lipase was 1968 units/L.  Troponin x1 was negative.  EKG shows old inferior infarct, no new changes.  Imaging: CT abdomen/pelvis show epigastric retroperitoneal edema compatible with the acute pancreatitis by labs.  There is no collection or necrosis.  Nodular high density the ampulla of the CBD without ductal dilator Tatian suspicious for choledocholithiasis. MRCP was suggested by radiology.  Review of Systems: As per HPI otherwise all other systems reviewed and are negative.  Past Medical History:  Diagnosis Date  . Allergy   . GERD (gastroesophageal reflux disease)   . Hyperlipidemia   . Hypertension   . Myocardial infarction ([place])    X 2 stents   Past Surgical History:  Procedure Laterality Date  . CARDIAC CATHETERIZATION     X 2 stents  . FRACTURE SURGERY     1967 wore cast    Social History  reports that he quit smoking about 38 years ago. He has never used smokeless tobacco. He reports that he does not drink alcohol and does not use drugs.  No Known Allergies  Family History  Problem Relation Age of Onset  . Asthma Mother   . Hearing loss Mother   . Heart disease Father   . Diabetes Brother   . Diabetes Maternal Grandmother   . Colon cancer Neg Hx   . Colon polyps Neg Hx    Prior to Admission medications   Medication Sig  Start Date End Date Taking? Authorizing Provider  acetaminophen (TYLENOL ARTHRITIS PAIN) 650 MG CR tablet Take 2 tablets (1,300 mg total) by mouth every 8 (eight) hours as needed. For pain 11/19/16   Orlena Sheldon, PA-C  aspirin EC 81 MG tablet Take 1 tablet (81 mg total) by mouth daily. 11/19/16   Orlena Sheldon, PA-C  cetirizine (ZYRTEC) 10 MG tablet Take 1 tablet (10 mg total) by mouth daily. 05/03/19   Susy Frizzle, MD  cholecalciferol (VITAMIN D) 1000 units tablet Take 1 tablet (1,000 Units total)  by mouth daily. 11/19/16   Orlena Sheldon, PA-C  HYDROcodone-acetaminophen (NORCO/VICODIN) 5-325 MG tablet Take 1 tablet by mouth every 6 (six) hours as needed. 10/03/19   Susy Frizzle, MD  lansoprazole (PREVACID) 30 MG capsule Take 1 capsule (30 mg total) by mouth 2 (two) times daily. 11/19/16   Orlena Sheldon, PA-C  methocarbamol (ROBAXIN) 750 MG tablet Take 1 tablet (750 mg total) by mouth 4 (four) times daily. Patient taking differently: Take 750 mg by mouth 4 (four) times daily. As needed 05/03/19   Susy Frizzle, MD  metoprolol succinate (TOPROL-XL) 50 MG 24 hr tablet TAKE 1/2 TABLET BY MOUTH IN THE MORNING AND 1/2 TABLET BY MOUTH IN THE EVENING 11/19/16   Orlena Sheldon, PA-C  Multiple Vitamin (MULTIVITAMIN WITH MINERALS) TABS tablet Take 1 tablet by mouth daily. 11/19/16   Orlena Sheldon, PA-C  neomycin-bacitracin-polymyxin (NEOSPORIN) [phone] ointment Apply topically 3 (three) times daily. To the affected eyelid 05/01/20   Annie Main, FNP  Omega-3 Fatty Acids (FISH OIL) 1000 MG CAPS Take by mouth daily.    [provider]  rosuvastatin (CRESTOR) 40 MG tablet Take 1 tablet (40 mg total) by mouth at bedtime. 11/19/16   Orlena Sheldon, PA-C    Physical Exam: Vitals:   05/18/20 0111 05/18/20 0112  BP: (!) 152/103   Pulse: 71   Resp: 18   Temp: 98.6 F (37 C)   TempSrc: Oral   SpO2: 100%   Weight:  68.5 kg  Height:  5\' 8"  (1.727 m)    Constitutional: Elderly, pleasant male. Eyes: PERRL, positive styes on both eyelids.  Mild icteric sclera. ENMT: Mucous membranes are moist. Posterior pharynx clear of any exudate or lesions. Neck: normal, supple, no masses, no thyromegaly Respiratory: clear to auscultation bilaterally, no wheezing, no crackles. Normal respiratory effort. No accessory muscle use.  Cardiovascular: Regular rate and rhythm, no murmurs / rubs / gallops. No extremity edema. 2+ pedal pulses. No carotid bruits.  Abdomen: Positive umbilical hernia.  No distention.   BS positive.  Soft, mild RUQ tenderness (pulse analgesic), no guarding or rebound, no masses palpated. No hepatosplenomegaly. Bowel sounds positive.  Musculoskeletal: no clubbing / cyanosis. Good ROM, no contractures. Normal muscle tone.  Skin: no rashes, lesions, ulcers on limited dermatological examination. Neurologic: CN 2-12 grossly intact. Sensation intact, DTR normal. Strength 5/5 in all 4.  Psychiatric: Normal judgment and insight. Alert and oriented x 3. Normal mood.   Labs on Admission: I have personally reviewed following labs and imaging studies  CBC: Recent Labs  Lab 05/18/20 0227  WBC 10.6*  HGB 12.7*  HCT 38.2*  MCV 91.4  PLT 762    Basic Metabolic Panel: Recent Labs  Lab 05/18/20 0227  NA 136  K 4.4  CL 98  CO2 27  GLUCOSE 174*  BUN 18  CREATININE 0.77  CALCIUM 9.7    GFR: Estimated Creatinine Clearance:  68.9 mL/min (by C-G formula based on SCr of 0.77 mg/dL).  Liver Function Tests: Recent Labs  Lab 05/18/20 0227  AST 373*  ALT 273*  ALKPHOS 157*  BILITOT 2.5*  PROT 7.4  ALBUMIN 4.4   Urine analysis:    Component Value Date/Time   COLORURINE YELLOW 05/18/2020 0111   APPEARANCEUR HAZY (A) 05/18/2020 0111   LABSPEC 1.018 05/18/2020 0111   PHURINE 6.0 05/18/2020 0111   GLUCOSEU NEGATIVE 05/18/2020 0111   HGBUR NEGATIVE 05/18/2020 0111   BILIRUBINUR NEGATIVE 05/18/2020 0111   KETONESUR NEGATIVE 05/18/2020 0111   PROTEINUR NEGATIVE 05/18/2020 0111   UROBILINOGEN 0.2 09/27/2012 1445   NITRITE NEGATIVE 05/18/2020 0111   LEUKOCYTESUR NEGATIVE 05/18/2020 0111   Radiological Exams on Admission: CT ABDOMEN PELVIS W CONTRAST  Result Date: 05/18/2020 CLINICAL DATA:  Abdominal pain with biliary obstruction suspected. EXAM: CT ABDOMEN AND PELVIS WITH CONTRAST TECHNIQUE: Multidetector CT imaging of the abdomen and pelvis was performed using the standard protocol following bolus administration of intravenous contrast. CONTRAST:  123mL OMNIPAQUE IOHEXOL  300 MG/ML  SOLN COMPARISON:  12/01/2019 FINDINGS: Lower chest:  Coronary and aortic atherosclerosis.  Mild atelectasis Hepatobiliary: No focal liver abnormality.No evidence of biliary obstruction or stone. Nodular high-density at the ampulla measuring 9 mm, noncalcified. The upstream CBD is notably not dilated, which favors acute obstruction from calculus rather than mass. Pancreas: Regional retroperitoneal edema but no clear parenchymal expansion and no pancreatic collection. Spleen: Unremarkable. Adrenals/Urinary Tract: Negative adrenals. No hydronephrosis or stone. Small low-density renal lesions that are too small for accurate densitometry. Unremarkable bladder. Stomach/Bowel: No obstruction. Upper retroperitoneal edema which is in proximity to the distal duodenum, which shows no wall thickening or discrete ulceration. Vascular/Lymphatic: No acute vascular abnormality. Widespread atheromatous calcification. No mass or adenopathy. Reproductive:No pathologic findings. Other: No ascites or pneumoperitoneum.  Fatty umbilical hernia. Musculoskeletal: No acute abnormalities. Diffuse degenerative disease with mild lumbar levoscoliosis. IMPRESSION: 1. Epigastric retroperitoneal edema compatible with the acute pancreatitis by labs. No collection or necrosis. 2. Nodular high-density at the ampulla of the CBD without ductal dilatation, suspicious for choledocholithiasis. Suggest MRCP. Electronically Signed   By: Monte Fantasia M.D.   On: 05/18/2020 04:47   EKG: Independently reviewed.  Vent. rate 70 BPM PR interval * ms QRS duration 94 ms QT/QTc 386/417 ms P-R-T axes 32 -18 -10 Sinus rhythm Inferior infarct, old  Assessment/Plan Principal Problem:   Acute gallstone pancreatitis Observation/telemetry. Keep n.p.o. Continue IV fluids. Analgesics as needed. Antiemetics as needed Protonix 40 mg IVP every 24 hours. Check MRCP later today. Follow-up CBC, CMP and lipase. Consult general surgery.  Active  Problems:   Cholestatic hepatitis Check MRCP. Consult general surgery.    Coronary artery disease Hold statin. Switch beta-blocker to IV form. Continue aspirin 81 mg p.o. daily.    Hyperlipidemia Hold statin due to cholestatic transaminitis.    HTN (hypertension) Metoprolol 5 mg IVP every 8 hours while NPO. Monitor blood pressure and heart rate.    GERD (gastroesophageal reflux disease) On Protonix IV.    Normocytic anemia Monitor H&H    DVT prophylaxis: SCDs. Code Status:   Full code. Family Communication: Disposition Plan:   Patient is from:  Home.  Anticipated DC to:  Home.  Anticipated DC date:  05/20/2020.  Anticipated DC barriers: Clinical status.  Consults called:  Routine general surgery consult. Admission status:  Observation/telemetry.  Severity of Illness:  Reubin Milan MD [facility]  How to contact the [facility] Attending or Consulting provider 7A -  7P or covering provider during after hours [place], for this patient?   1. Check the care team in [facility] and look for a) attending/consulting [facility] provider listed and b) the [facility] team listed 2. Log into [email or web address] and use [place]'s universal password to access. If you do not have the password, please contact the hospital operator. 3. Locate the [facility] provider you are looking for under [facility] and page to a number that you can be directly reached. 4. If you still have difficulty reaching the provider, please page the [facility] (Director on Call) for the Hospitalists listed on amion for assistance.  05/18/2020, 5:11 AM   This document was prepared using Dragon voice recognition software and may contain some unintended transcription errors.

## 2020-05-18 NOTE — Progress Notes (Signed)
Brief ERCP note.  Erosion noted at ampullary orifice otherwise normal-appearing ampulla of Vater. CBD cannulated without difficulty and filled with contrast. Dilated CBD and CHD with suggestion of filling defect distally. Very sphincterotomy performed with removal of mucus and debris but no stone noted. Able to pass 10 mm balloon across sphincterotomy without any difficulty. Minimal bleeding post sphincterotomy did not require any intervention. Patient tolerated procedure well.

## 2020-05-18 NOTE — ED Provider Notes (Signed)
[facility] EMERGENCY DEPARTMENT Provider Note   CSN: [id number] Arrival date & time: 05/18/20  0017     History Chief Complaint  Patient presents with  . Abdominal Pain    Randy Thomas is a 84 y.o. male.  Patient with history of CAD with stents, hypertension, hyperlipidemia, gallstones presenting with upper abdominal pain that onset about 4 PM.  States the pain was very severe in his epigastrium and right upper quadrant.  Improved after about 1 hour but then became worse again he ate around 9:00pm.  He vomited 2 times.  The pain is constant in his abdomen and is spreading diffusely. It is now lower than when it first started.  There is some radiation to the back.  Denies chest pain.  Denies cough or fever.  Denies pain with urination or blood in the urine.  Pain is similar to when he was diagnosed with gallstones in December.  He saw Dr. Arnoldo Morale in March and was told he did not need to have his gallbladder out emergently.  Denies any history of acid reflux though it is listed in his chart.  He is having constant pain at this time but is improved since previous.  He denies chest pain.  Denies this feeling similar to his MI type pain.  The history is provided by the patient.  Abdominal Pain Associated symptoms: nausea and vomiting   Associated symptoms: no chest pain, no cough, no diarrhea, no dysuria, no fever, no hematuria and no shortness of breath        Past Medical History:  Diagnosis Date  . Allergy   . GERD (gastroesophageal reflux disease)   . Hyperlipidemia   . Hypertension   . Myocardial infarction ([place])    X 2 stents    Patient Active Problem List   Diagnosis Date Noted  . Diarrhea 12/07/2019  . Loss of weight 12/07/2019  . Cholecystitis 08/22/2019  . Coronary artery disease 10/10/2015  . Hyperlipidemia 10/10/2015  . HTN (hypertension) 10/10/2015  . Vitamin D deficiency 10/10/2015    Past Surgical History:  Procedure Laterality Date  . CARDIAC  CATHETERIZATION     X 2 stents  . FRACTURE SURGERY     1967 wore cast       Family History  Problem Relation Age of Onset  . Asthma Mother   . Hearing loss Mother   . Heart disease Father   . Diabetes Brother   . Diabetes Maternal Grandmother   . Colon cancer Neg Hx   . Colon polyps Neg Hx     Social History   Tobacco Use  . Smoking status: Former Smoker    Quit date: 09/15/1981    Years since quitting: 38.6  . Smokeless tobacco: Never Used  Substance Use Topics  . Alcohol use: No    Alcohol/week: 0.0 standard drinks  . Drug use: No    Home Medications Prior to Admission medications   Medication Sig Start Date End Date Taking? Authorizing Provider  acetaminophen (TYLENOL ARTHRITIS PAIN) 650 MG CR tablet Take 2 tablets (1,300 mg total) by mouth every 8 (eight) hours as needed. For pain 11/19/16   Orlena Sheldon, PA-C  aspirin EC 81 MG tablet Take 1 tablet (81 mg total) by mouth daily. 11/19/16   Orlena Sheldon, PA-C  cetirizine (ZYRTEC) 10 MG tablet Take 1 tablet (10 mg total) by mouth daily. 05/03/19   Susy Frizzle, MD  cholecalciferol (VITAMIN D) 1000 units tablet Take 1 tablet (  1,000 Units total) by mouth daily. 11/19/16   Orlena Sheldon, PA-C  HYDROcodone-acetaminophen (NORCO/VICODIN) 5-325 MG tablet Take 1 tablet by mouth every 6 (six) hours as needed. 10/03/19   Susy Frizzle, MD  lansoprazole (PREVACID) 30 MG capsule Take 1 capsule (30 mg total) by mouth 2 (two) times daily. 11/19/16   Orlena Sheldon, PA-C  methocarbamol (ROBAXIN) 750 MG tablet Take 1 tablet (750 mg total) by mouth 4 (four) times daily. Patient taking differently: Take 750 mg by mouth 4 (four) times daily. As needed 05/03/19   Susy Frizzle, MD  metoprolol succinate (TOPROL-XL) 50 MG 24 hr tablet TAKE 1/2 TABLET BY MOUTH IN THE MORNING AND 1/2 TABLET BY MOUTH IN THE EVENING 11/19/16   Orlena Sheldon, PA-C  Multiple Vitamin (MULTIVITAMIN WITH MINERALS) TABS tablet Take 1 tablet by mouth daily. 11/19/16    Orlena Sheldon, PA-C  neomycin-bacitracin-polymyxin (NEOSPORIN) [phone] ointment Apply topically 3 (three) times daily. To the affected eyelid 05/01/20   Annie Main, FNP  Omega-3 Fatty Acids (FISH OIL) 1000 MG CAPS Take by mouth daily.    [provider]  rosuvastatin (CRESTOR) 40 MG tablet Take 1 tablet (40 mg total) by mouth at bedtime. 11/19/16   Orlena Sheldon, PA-C    Allergies    Patient has no known allergies.  Review of Systems   Review of Systems  Constitutional: Positive for activity change and appetite change. Negative for fever.  HENT: Negative for congestion and rhinorrhea.   Eyes: Negative for visual disturbance.  Respiratory: Negative for cough, chest tightness and shortness of breath.   Cardiovascular: Negative for chest pain.  Gastrointestinal: Positive for abdominal pain, nausea and vomiting. Negative for diarrhea.  Genitourinary: Negative for dysuria and hematuria.  Musculoskeletal: Positive for back pain. Negative for arthralgias and myalgias.  Skin: Negative for rash.  Neurological: Negative for dizziness, weakness and headaches.   all other systems are negative except as noted in the HPI and PMH.    Physical Exam Updated Vital Signs BP (!) 152/103 (BP Location: Right Arm)   Pulse 71   Temp 98.6 F (37 C) (Oral)   Resp 18   Ht 5\' 8"  (1.727 m)   Wt 68.5 kg   SpO2 100%   BMI 22.96 kg/m   Physical Exam Vitals and nursing note reviewed.  Constitutional:      General: He is not in acute distress.    Appearance: He is well-developed.  HENT:     Head: Normocephalic and atraumatic.     Mouth/Throat:     Pharynx: No oropharyngeal exudate.  Eyes:     Conjunctiva/sclera: Conjunctivae normal.     Pupils: Pupils are equal, round, and reactive to light.  Neck:     Comments: No meningismus. Cardiovascular:     Rate and Rhythm: Normal rate and regular rhythm.     Heart sounds: Normal heart sounds. No murmur heard.      Comments: Equal femoral  pulses bilaterally Pulmonary:     Effort: Pulmonary effort is normal. No respiratory distress.     Breath sounds: Normal breath sounds.  Abdominal:     Palpations: Abdomen is soft.     Tenderness: There is abdominal tenderness. There is guarding. There is no rebound.     Comments: Diffuse tenderness, worse in the epigastric right upper quadrant with voluntary guarding.  No rebound.  Reducible umbilical hernia  Musculoskeletal:        General: No tenderness. Normal range  of motion.     Cervical back: Normal range of motion and neck supple.  Skin:    General: Skin is warm.  Neurological:     Mental Status: He is alert and oriented to person, place, and time.     Cranial Nerves: No cranial nerve deficit.     Motor: No abnormal muscle tone.     Coordination: Coordination normal.     Comments: No ataxia on finger to nose bilaterally. No pronator drift. 5/5 strength throughout. CN 2-12 intact.Equal grip strength. Sensation intact.   Psychiatric:        Behavior: Behavior normal.     ED Results / Procedures / Treatments   Labs (all labs ordered are listed, but only abnormal results are displayed) Labs Reviewed  LIPASE, BLOOD - Abnormal; Notable for the following components:      Result Value   Lipase 1,868 (*)    All other components within normal limits  COMPREHENSIVE METABOLIC PANEL - Abnormal; Notable for the following components:   Glucose, Bld 174 (*)    AST 373 (*)    ALT 273 (*)    Alkaline Phosphatase 157 (*)    Total Bilirubin 2.5 (*)    All other components within normal limits  CBC - Abnormal; Notable for the following components:   WBC 10.6 (*)    RBC 4.18 (*)    Hemoglobin 12.7 (*)    HCT 38.2 (*)    All other components within normal limits  URINALYSIS, ROUTINE W REFLEX MICROSCOPIC - Abnormal; Notable for the following components:   APPearance HAZY (*)    All other components within normal limits  SARS CORONAVIRUS 2 BY RT PCR (HOSPITAL ORDER, [facility])  TROPONIN I (HIGH SENSITIVITY)  TROPONIN I (HIGH SENSITIVITY)    EKG EKG Interpretation  Date/Time:  Friday May 18 2020 02:26:02 EDT Ventricular Rate:  70 PR Interval:    QRS Duration: 94 QT Interval:  386 QTC Calculation: 417 R Axis:   -18 Text Interpretation: Sinus rhythm Inferior infarct, old No significant change was found Confirmed by Ezequiel Essex [phone]) on 05/18/2020 2:41:05 AM   Radiology CT ABDOMEN PELVIS W CONTRAST  Result Date: 05/18/2020 CLINICAL DATA:  Abdominal pain with biliary obstruction suspected. EXAM: CT ABDOMEN AND PELVIS WITH CONTRAST TECHNIQUE: Multidetector CT imaging of the abdomen and pelvis was performed using the standard protocol following bolus administration of intravenous contrast. CONTRAST:  156mL OMNIPAQUE IOHEXOL 300 MG/ML  SOLN COMPARISON:  12/01/2019 FINDINGS: Lower chest:  Coronary and aortic atherosclerosis.  Mild atelectasis Hepatobiliary: No focal liver abnormality.No evidence of biliary obstruction or stone. Nodular high-density at the ampulla measuring 9 mm, noncalcified. The upstream CBD is notably not dilated, which favors acute obstruction from calculus rather than mass. Pancreas: Regional retroperitoneal edema but no clear parenchymal expansion and no pancreatic collection. Spleen: Unremarkable. Adrenals/Urinary Tract: Negative adrenals. No hydronephrosis or stone. Small low-density renal lesions that are too small for accurate densitometry. Unremarkable bladder. Stomach/Bowel: No obstruction. Upper retroperitoneal edema which is in proximity to the distal duodenum, which shows no wall thickening or discrete ulceration. Vascular/Lymphatic: No acute vascular abnormality. Widespread atheromatous calcification. No mass or adenopathy. Reproductive:No pathologic findings. Other: No ascites or pneumoperitoneum.  Fatty umbilical hernia. Musculoskeletal: No acute abnormalities. Diffuse degenerative disease with mild lumbar  levoscoliosis. IMPRESSION: 1. Epigastric retroperitoneal edema compatible with the acute pancreatitis by labs. No collection or necrosis. 2. Nodular high-density at the ampulla of the CBD without ductal dilatation, suspicious  for choledocholithiasis. Suggest MRCP. Electronically Signed   By: Monte Fantasia M.D.   On: 05/18/2020 04:47    Procedures Procedures (including critical care time)  Medications Ordered in ED Medications  fentaNYL (SUBLIMAZE) injection 25 mcg (has no administration in time range)  ondansetron (ZOFRAN) injection 4 mg (has no administration in time range)    ED Course  I have reviewed the triage vital signs and the nursing notes.  Pertinent labs & imaging results that were available during my care of the patient were reviewed by me and considered in my medical decision making (see chart for details).    MDM Rules/Calculators/A&P                         Upper abdominal pain with nausea and vomiting.  History of gallstones.  EKG is unchanged.  Concern for gallstones versus possible pancreatitis versus cholecystitis. He is not febrile.  Labs show lipase of 1800. Transaminitis with total bilirubin elevation. No leukocytosis no fever.  Concern for gallstone pancreatitis. Appears to have CBD stone as well. May have choledocholithiasis.  Plan pain control, IV hydration, admission to the hospital. Patient will need GI evaluation and eventually general surgery evaluation. No indication for emergent cholecystectomy or surgical consult currently.  Patient and son updated at bedside. Admission discussed with Dr. Olevia Bowens. Final Clinical Impression(s) / ED Diagnoses Final diagnoses:  Gallstone pancreatitis  Choledocholithiasis    Rx / DC Orders ED Discharge Orders    None       Baldo Hufnagle, Annie Main, MD 05/18/20 [phone]

## 2020-05-18 NOTE — H&P (View-Only) (Signed)
I was present with the medical student for this service. I personally verified the history of present illness, performed the physical exam, and made the plan for this encounter. I have verified the medical student's documentation and made modifications where appropriately. I have personally documented in my own words a brief history, physical, and plan below.     Patient with prior attempt at lap chole that was aborted due to anxiety from the patient. Now with gallstone pancreatitis/ choledocholithiasis. Getting ERCP. Discussed option of inpatient lap chole with patient to prevent recurrence of this disease process. He is in agreement. Also with umbilical hernia he wants repaired if possible.  Soft, nondistended, mildly tender Personally reviewed CT and MRCP- distal CBD stone likely, contracted gallbladder on CT   Discussed surgery with patient and he thinks he wants to proceed this admission. Discussed option of _0 69 wore cast    Family History  Problem Relation Age of Onset  . Asthma Mother   . Hearing loss Mother   . Heart disease Father   . Diabetes Brother   . Diabetes Maternal Grandmother   . Colon cancer Neg Hx   . Colon polyps Neg Hx     Social History:  reports that he quit smoking about 38 years ago. He has never used smokeless tobacco. He reports that he does not drink  alcohol and does not use drugs.  Allergies: No Known Allergies  Medications: I have personally reviewed this patient's medications. Current Facility-Administered Medications  Medication Dose Route Frequency Provider Last Rate Last Admin  . 0.9 %  sodium chloride infusion   Intravenous Continuous Reubin Milan, MD 150 mL/hr at 05/18/20 1052 New Bag at 05/18/20 1052  . [MAR Hold] cefTRIAXone (ROCEPHIN) 2 g in sodium chloride 0.9 % 100 mL IVPB  2 g Intravenous Q24H Caren Griffins, MD      . Doug Sou Hold]  HYDROmorphone (DILAUDID) injection 1 mg  1 mg Intravenous Q4H PRN Reubin Milan, MD   1 mg at 05/18/20 0530  . [MAR Hold] metoprolol tartrate (LOPRESSOR) injection 5 mg  5 mg Intravenous Q8H Reubin Milan, MD      . Doug Sou Hold] ondansetron [facility]) tablet 4 mg  4 mg Oral Q6H PRN Reubin Milan, MD       Or  . Doug Sou Hold] ondansetron [facility]) injection 4 mg  4 mg Intravenous Q6H PRN Reubin Milan, MD      . Doug Sou Hold] pantoprazole (PROTONIX) injection 40 mg  40 mg Intravenous Q24H Reubin Milan, MD   40 mg at 05/18/20 1048   Results for orders placed or performed during the hospital encounter of 05/18/20 (from the past 48 hour(s))  Urinalysis, Routine w reflex microscopic Urine, Clean Catch     Status: Abnormal   Collection Time: 05/18/20  1:11 AM  Result Value Ref Range   Color, Urine YELLOW YELLOW   APPearance HAZY (A) CLEAR   Specific Gravity, Urine 1.018 1.005 - 1.030   pH 6.0 5.0 - 8.0   Glucose, UA NEGATIVE NEGATIVE mg/dL   Hgb urine dipstick NEGATIVE NEGATIVE   Bilirubin Urine NEGATIVE NEGATIVE   Ketones, ur NEGATIVE NEGATIVE mg/dL   Protein, ur NEGATIVE NEGATIVE mg/dL   Nitrite NEGATIVE NEGATIVE   Leukocytes,Ua NEGATIVE NEGATIVE    Comment: Performed at [facility], [address], [place], [place] [address]  Lipase, blood     Status: Abnormal   Collection Time: 05/18/20  2:27 AM  Result Value Ref Range   Lipase 1,868 (H) 11 - 51 U/L    Comment: RESULTS CONFIRMED BY MANUAL DILUTION Performed at [facility], [address] [place], [place] [address]   Comprehensive metabolic panel     Status: Abnormal   Collection Time: 05/18/20  2:27 AM  Result Value Ref Range   Sodium 136 135 - 145 mmol/L   Potassium 4.4 3.5 - 5.1 mmol/L   Chloride 98 98 - 111 mmol/L   CO2 27 22 - 32 mmol/L   Glucose, Bld 174 (H) 70 - 99 mg/dL    Comment: Glucose reference range applies only to samples taken after fasting for at least 8 hours.   BUN 18 8 - 23 mg/dL    Creatinine, Ser 0.77 0.61 - 1.24 mg/dL   Calcium 9.7 8.9 - 10.3 mg/dL   Total Protein 7.4 6.5 - 8.1 g/dL   Albumin 4.4 3.5 - 5.0 g/dL   AST 373 (H) 15 - 41 U/L   ALT 273 (H) 0 - 44 U/L   Alkaline Phosphatase 157 (H) 38 - 126 U/L   Total Bilirubin 2.5 (H) 0.3 - 1.2 mg/dL   GFR calc non Af Amer >60 >60 mL/min   GFR calc Af Amer >60 >60 mL/min   Anion gap 11 5 - 15    Comment: Performed at [facility], [address], [place], [place] [address]  CBC     Status: Abnormal   Collection Time: 05/18/20  2:27 AM  Result Value Ref Range   WBC 10.6 (H) 4.0 - 10.5 K/uL   RBC 4.18 (L) 4.22 - 5.81 MIL/uL   Hemoglobin 12.7 (L) 13.0 - 17.0 g/dL   HCT 38.2 (L) 39 - 52 %   MCV 91.4 80.0 - 100.0 fL   MCH 30.4 26.0 - 34.0 pg   MCHC 33.2 30.0 - 36.0 g/dL   RDW 13.6 11.5 - 15.5 %   Platelets 159 150 - 400 K/uL   nRBC 0.0 0.0 - 0.2 %    Comment: Performed at [facility], [address], [place], [place] [address]  Troponin I (High Sensitivity)     Status: None   Collection Time: 05/18/20  2:27 AM  Result Value Ref Range   Troponin I (High Sensitivity) 7 <18 ng/L    Comment: (NOTE) Elevated high sensitivity troponin I (hsTnI) values and significant  changes across serial measurements may suggest ACS but many other  chronic and acute conditions are known to elevate hsTnI results.  Refer to the Links section for chest pain algorithms and additional  guidance. Performed at [facility], [address], [place], [place] [address]   SARS Coronavirus 2 by RT PCR (hospital order, performed in [facility] hospital lab) Nasopharyngeal Nasopharyngeal Swab     Status: None   Collection Time: 05/18/20  4:56 AM   Specimen: Nasopharyngeal Swab  Result Value Ref Range   SARS Coronavirus 2 NEGATIVE NEGATIVE    Comment: (NOTE) SARS-CoV-2 target nucleic acids are NOT DETECTED.  The SARS-CoV-2 RNA is generally detectable in upper and lower respiratory specimens during the acute phase of infection. The  lowest concentration of SARS-CoV-2 viral copies this assay can detect is 250 copies / mL. A negative result does not preclude SARS-CoV-2 infection and should not be used as the sole basis for treatment or other patient management decisions.  A negative result may occur with improper specimen collection / handling, submission of specimen other than nasopharyngeal swab, presence of viral mutation(s) within the areas targeted by this assay, and inadequate number of viral copies (<250 copies / mL). A negative result must be combined with clinical observations, patient history, and epidemiological information.  Fact Sheet for Patients:   StrictlyIdeas.no  Fact Sheet for Healthcare Providers: BankingDealers.co.za  This test is not yet approved or  cleared by the [place] FDA and has been authorized for detection and/or diagnosis of SARS-CoV-2 by FDA under an Emergency Use Authorization (EUA).  This EUA will remain in effect (meaning this test can be used) for the duration of the COVID-19 declaration under Section 564(b)(1) of the Act, 21 U.S.C. section 360bbb-3(b)(1), unless the authorization is terminated or revoked sooner.  Performed at [facility], [address], [place], [place] [address]   Troponin I (High Sensitivity)     Status: None   Collection Time: 05/18/20  5:19 AM  Result Value Ref Range   Troponin I (High Sensitivity) 7 <18 ng/L    Comment: (NOTE) Elevated high sensitivity troponin I (hsTnI) values and significant  changes across serial measurements may suggest ACS but many other  chronic and acute conditions are known to elevate hsTnI results.  Refer to the "Links" section for chest pain algorithms and additional  guidance. Performed at [facility], [address] [place], [place] [address]     CT ABDOMEN PELVIS W CONTRAST  Result Date:  05/18/2020 CLINICAL DATA:  Abdominal pain with biliary obstruction suspected.  EXAM: CT ABDOMEN AND PELVIS WITH CONTRAST TECHNIQUE: Multidetector CT imaging of the abdomen and pelvis was performed using the standard protocol following bolus administration of intravenous contrast. CONTRAST:  168m OMNIPAQUE IOHEXOL 300 MG/ML  SOLN COMPARISON:  12/01/2019 FINDINGS: Lower chest:  Coronary and aortic atherosclerosis.  Mild atelectasis Hepatobiliary: No focal liver abnormality.No evidence of biliary obstruction or stone. Nodular high-density at the ampulla measuring 9 mm, noncalcified. The upstream CBD is notably not dilated, which favors acute obstruction from calculus rather than mass. Pancreas: Regional retroperitoneal edema but no clear parenchymal expansion and no pancreatic collection. Spleen: Unremarkable. Adrenals/Urinary Tract: Negative adrenals. No hydronephrosis or stone. Small low-density renal lesions that are too small for accurate densitometry. Unremarkable bladder. Stomach/Bowel: No obstruction. Upper retroperitoneal edema which is in proximity to the distal duodenum, which shows no wall thickening or discrete ulceration. Vascular/Lymphatic: No acute vascular abnormality. Widespread atheromatous calcification. No mass or adenopathy. Reproductive:No pathologic findings. Other: No ascites or pneumoperitoneum.  Fatty umbilical hernia. Musculoskeletal: No acute abnormalities. Diffuse degenerative disease with mild lumbar levoscoliosis. IMPRESSION: 1. Epigastric retroperitoneal edema compatible with the acute pancreatitis by labs. No collection or necrosis. 2. Nodular high-density at the ampulla of the CBD without ductal dilatation, suspicious for choledocholithiasis. Suggest MRCP. Electronically Signed   By: JMonte FantasiaM.D.   On: 05/18/2020 04:47   MR 3D Recon At Scanner  Result Date: 05/18/2020 CLINICAL DATA:  Severe pancreatitis, right upper quadrant pain, nausea, vomiting, choledocholithiasis suspected on prior CT EXAM: MRI ABDOMEN WITHOUT AND WITH CONTRAST (INCLUDING MRCP)  TECHNIQUE: Multiplanar multisequence MR imaging of the abdomen was performed both before and after the administration of intravenous contrast. Heavily T2-weighted images of the biliary and pancreatic ducts were obtained, and three-dimensional MRCP images were rendered by post processing. CONTRAST:  748mGADAVIST GADOBUTROL 1 MMOL/ML IV SOLN COMPARISON:  CT abdomen pelvis, 05/18/2020 FINDINGS: Lower chest: No acute findings. Hepatobiliary: No mass or other parenchymal abnormality identified. Multiple small gallstones and/or polyps in the gallbladder. There is mild enlargement of the common bile duct, measuring up to 8 mm, and there is a possible, although not definite small gallstone at the ampulla measuring approximately 3 mm (series 15, image 19). Pancreas: There is inflammatory fat stranding centered about the pancreatic head and adjacent retroperitoneum. There is no evidence of parenchymal necrosis or acute pancreatic fluid collection. No pancreatic ductal dilatation Spleen:  Within normal limits in size and appearance. Adrenals/Urinary Tract: No masses identified. Nonenhancing exophytic cyst of the anterior midportion of the left kidney. No evidence of hydronephrosis. Stomach/Bowel: Visualized portions within the abdomen are unremarkable. Vascular/Lymphatic: No pathologically enlarged lymph nodes identified. No abdominal aortic aneurysm demonstrated. Other:  None. Musculoskeletal: No suspicious bone lesions identified. IMPRESSION: 1. Inflammatory fat stranding centered about the pancreatic head and adjacent retroperitoneum, consistent with acute pancreatitis. No evidence of parenchymal necrosis or acute pancreatic fluid collection. 2. There is a possible gallstone no larger than 3 mm at the ampulla. Mild enlargement of the common bile duct, measuring up to 8 mm. 3. Additional small gallstones and/or polyps in the gallbladder. Electronically Signed   By: AlEddie Candle.D.   On: 05/18/2020 08:30   MR ABDOMEN MRCP  W WO CONTAST  Result Date: 05/18/2020 CLINICAL DATA:  Severe pancreatitis, right upper quadrant pain, nausea, vomiting, choledocholithiasis suspected on prior CT EXAM: MRI ABDOMEN WITHOUT AND WITH CONTRAST (INCLUDING MRCP) TECHNIQUE: Multiplanar multisequence MR imaging of the abdomen was performed both before  and after the administration of intravenous contrast. Heavily T2-weighted images of the biliary and pancreatic ducts were obtained, and three-dimensional MRCP images were rendered by post processing. CONTRAST:  42m GADAVIST GADOBUTROL 1 MMOL/ML IV SOLN COMPARISON:  CT abdomen pelvis, 05/18/2020 FINDINGS: Lower chest: No acute findings. Hepatobiliary: No mass or other parenchymal abnormality identified. Multiple small gallstones and/or polyps in the gallbladder. There is mild enlargement of the common bile duct, measuring up to 8 mm, and there is a possible, although not definite small gallstone at the ampulla measuring approximately 3 mm (series 15, image 19). Pancreas: There is inflammatory fat stranding centered about the pancreatic head and adjacent retroperitoneum. There is no evidence of parenchymal necrosis or acute pancreatic fluid collection. No pancreatic ductal dilatation Spleen:  Within normal limits in size and appearance. Adrenals/Urinary Tract: No masses identified. Nonenhancing exophytic cyst of the anterior midportion of the left kidney. No evidence of hydronephrosis. Stomach/Bowel: Visualized portions within the abdomen are unremarkable. Vascular/Lymphatic: No pathologically enlarged lymph nodes identified. No abdominal aortic aneurysm demonstrated. Other:  None. Musculoskeletal: No suspicious bone lesions identified. IMPRESSION: 1. Inflammatory fat stranding centered about the pancreatic head and adjacent retroperitoneum, consistent with acute pancreatitis. No evidence of parenchymal necrosis or acute pancreatic fluid collection. 2. There is a possible gallstone no larger than 3 mm at the  ampulla. Mild enlargement of the common bile duct, measuring up to 8 mm. 3. Additional small gallstones and/or polyps in the gallbladder. Electronically Signed   By: AEddie CandleM.D.   On: 05/18/2020 08:30    ROS:  Pertinent items are noted in HPI.  Blood pressure 137/73, pulse (!) 104, temperature 98.2 F (36.8 C), temperature source Oral, resp. rate 14, height 5' 8" (1.727 m), weight 68.5 kg, SpO2 96 %. Physical Exam:  Vitals and nursing note reviewed.  Constitutional: no acute distress, lying comfortably in bed Head: atraumatic ENT: external ears normal Eyes: EOMI, mild scleral icterus Cardiovascular: regular rate and rhythm, normal heart sounds Pulmonary: effort normal, bilateral mid-lower lobe inspiratory crackles Abdominal: flat, tenderness to deep palpation below right costal margin, TTP lower abdomen and midline epigastrium, no rebound tenderness, bowel sounds normal, reducible, nontender umbilical hernia with no overlying skin changes Skin: warm and dry Neurological: alert, no focal deficit Psychiatric: normal mood and affect  Assessment/Plan: Choledocholithiasis with acute gallstone pancreatitis Pt feeling well on exam with appropriate pain management. MRCP shows multiple gallstones in the gallbladder and small stone noted at the ampulla vater. Alk phos 157, AST 373, ALT 273, T bili 2.5, and mild dilation of CBD likely indicate some degree of obstruction at this time. Pt is afebrile with only mild RUQ at this time and no leukocytosis. Likely not cholangitis but high possibility of progression. Additionally, lipase significantly elevated at 1868 indicating severe pancreatitis likely secondary to gallstone obstruction. Will receive ERCP today. Continue IVF, abx and f/u with cholecystectomy tomorrow pending ERCP. -NPO -IV NS 150 mL/hr -IV Ceftriaxone 2g QD -Dilaudid [id number] -Consider cholecystectomy tomorrow pending results of ERCP today   NiFulton Reek/11/2019, 11:27 AM

## 2020-05-18 NOTE — Transfer of Care (Signed)
Immediate Anesthesia Transfer of Care Note  Patient: Randy Thomas  Procedure(s) Performed: ENDOSCOPIC RETROGRADE CHOLANGIOPANCREATOGRAPHY (ERCP)  (N/A ) SPHINCTEROTOMY mild  Patient Location: PACU  Anesthesia Type:General  Level of Consciousness: awake, alert , oriented and patient cooperative  Airway & Oxygen Therapy: Patient Spontanous Breathing and Patient connected to face mask oxygen  Post-op Assessment: Report given to RN, Post -op Vital signs reviewed and stable and Patient moving all extremities X 4  Post vital signs: Reviewed and stable  Last Vitals:  Vitals Value Taken Time  BP 137/65 05/18/20 1400  Temp 36.9 C 05/18/20 1355  Pulse 96 05/18/20 1402  Resp 19 05/18/20 1402  SpO2 100 % 05/18/20 1402  Vitals shown include unvalidated device data.  Last Pain:  Vitals:   05/18/20 1355  TempSrc:   PainSc: (P) 0-No pain         Complications: No complications documented.

## 2020-05-18 NOTE — ED Notes (Signed)
Pt transported to MRI 

## 2020-05-18 NOTE — Anesthesia Postprocedure Evaluation (Signed)
Anesthesia Post Note  Patient: Randy Thomas  Procedure(s) Performed: ENDOSCOPIC RETROGRADE CHOLANGIOPANCREATOGRAPHY (ERCP)  (N/A ) SPHINCTEROTOMY mild  Patient location during evaluation: PACU Anesthesia Type: General Level of consciousness: awake, oriented, awake and alert and patient cooperative Pain management: satisfactory to patient Vital Signs Assessment: post-procedure vital signs reviewed and stable Respiratory status: spontaneous breathing, respiratory function stable, nonlabored ventilation and patient connected to face mask oxygen Cardiovascular status: stable Postop Assessment: no apparent nausea or vomiting Anesthetic complications: no   No complications documented.   Last Vitals:  Vitals:   05/18/20 1143 05/18/20 1355  BP: (!) 151/77 (!) 141/64  Pulse: (!) 108   Resp: (!) 9 20  Temp: 37.1 C 36.9 C  SpO2: 95% 92%    Last Pain:  Vitals:   05/18/20 1355  TempSrc:   PainSc: (P) 0-No pain                 [name]

## 2020-05-18 NOTE — Anesthesia Preprocedure Evaluation (Addendum)
Anesthesia Evaluation  Patient identified by MRN, date of birth, ID band Patient awake    Reviewed: Allergy & Precautions, NPO status , Patient's Chart, lab work & pertinent test results, reviewed documented beta blocker date and time   History of Anesthesia Complications Negative for: history of anesthetic complications  Airway Mallampati: II  TM Distance: >3 FB Neck ROM: Full    Dental  (+) Upper Dentures, Edentulous Lower   Pulmonary former smoker,    Pulmonary exam normal breath sounds clear to auscultation       Cardiovascular Exercise Tolerance: Good hypertension, Pt. on medications and Pt. on home beta blockers + CAD, + Past MI and + Cardiac Stents  Normal cardiovascular exam Rhythm:Regular Rate:Normal  18-May-2020 02:26:02 [facility] ROUTINE RECORD Sinus rhythm Inferior infarct, old No significant change was found Confirmed by Ezequiel Essex [phone]) on 05/18/2020 2:41:05 AM   Neuro/Psych negative neurological ROS  negative psych ROS   GI/Hepatic GERD  Medicated,(+) Hepatitis -CBD stone and obstruction   Endo/Other  negative endocrine ROS  Renal/GU negative Renal ROS  negative genitourinary   Musculoskeletal negative musculoskeletal ROS (+)   Abdominal   Peds  Hematology negative hematology ROS (+) anemia ,   Anesthesia Other Findings   Reproductive/Obstetrics negative OB ROS                            Anesthesia Physical Anesthesia Plan  ASA: III and emergent  Anesthesia Plan: General   Post-op Pain Management:    Induction: Intravenous  PONV Risk Score and Plan: 3 and Ondansetron and Dexamethasone  Airway Management Planned: Oral ETT  Additional Equipment:   Intra-op Plan:   Post-operative Plan:   Informed Consent: I have reviewed the patients History and Physical, chart, labs and discussed the procedure including the risks, benefits and  alternatives for the proposed anesthesia with the patient or authorized representative who has indicated his/her understanding and acceptance.       Plan Discussed with: CRNA and Surgeon  Anesthesia Plan Comments:        Anesthesia Quick Evaluation

## 2020-05-18 NOTE — ED Triage Notes (Signed)
Pt reports abdominal pain that started 1600. Pt reports he was told he has gallbladder problems. Pt reports N/V.

## 2020-05-19 LAB — COMPREHENSIVE METABOLIC PANEL
ALT: 266 U/L — ABNORMAL HIGH (ref 0–44)
AST: 187 U/L — ABNORMAL HIGH (ref 15–41)
Albumin: 3.3 g/dL — ABNORMAL LOW (ref 3.5–5.0)
Alkaline Phosphatase: 217 U/L — ABNORMAL HIGH (ref 38–126)
Anion gap: 10 (ref 5–15)
BUN: 16 mg/dL (ref 8–23)
CO2: 23 mmol/L (ref 22–32)
Calcium: 8.2 mg/dL — ABNORMAL LOW (ref 8.9–10.3)
Chloride: 105 mmol/L (ref 98–111)
Creatinine, Ser: 0.67 mg/dL (ref 0.61–1.24)
GFR calc Af Amer: >60 mL/min (ref >60)
GFR calc non Af Amer: >60 mL/min (ref >60)
Glucose, Bld: 118 mg/dL — ABNORMAL HIGH (ref 70–99)
Potassium: 3.7 mmol/L (ref 3.5–5.1)
Sodium: 138 mmol/L (ref 135–145)
Total Bilirubin: 3.1 mg/dL — ABNORMAL HIGH (ref 0.3–1.2)
Total Protein: 5.8 g/dL — ABNORMAL LOW (ref 6.5–8.1)

## 2020-05-19 LAB — CBC WITH DIFFERENTIAL/PLATELET
Abs Immature Granulocytes: 0.03 10*3/uL (ref 0.00–0.07)
Basophils Absolute: 0 10*3/uL (ref 0.0–0.1)
Basophils Relative: 0 %
Eosinophils Absolute: 0 10*3/uL (ref 0.0–0.5)
Eosinophils Relative: 0 %
HCT: 35.3 % — ABNORMAL LOW (ref 39.0–52.0)
Hemoglobin: 11.5 g/dL — ABNORMAL LOW (ref 13.0–17.0)
Immature Granulocytes: 0 %
Lymphocytes Relative: 9 %
Lymphs Abs: 0.7 10*3/uL (ref 0.7–4.0)
MCH: 29.5 pg (ref 26.0–34.0)
MCHC: 32.6 g/dL (ref 30.0–36.0)
MCV: 90.5 fL (ref 80.0–100.0)
Monocytes Absolute: 0.7 10*3/uL (ref 0.1–1.0)
Monocytes Relative: 9 %
Neutro Abs: 6.8 10*3/uL (ref 1.7–7.7)
Neutrophils Relative %: 82 %
Platelets: 134 10*3/uL — ABNORMAL LOW (ref 150–400)
RBC: 3.9 MIL/uL — ABNORMAL LOW (ref 4.22–5.81)
RDW: 13.8 % (ref 11.5–15.5)
WBC: 8.2 10*3/uL (ref 4.0–10.5)
nRBC: 0 % (ref 0.0–0.2)

## 2020-05-19 LAB — LIPASE, BLOOD: Lipase: 104 U/L — ABNORMAL HIGH (ref 11–51)

## 2020-05-19 LAB — SURGICAL PCR SCREEN
MRSA, PCR: NEGATIVE
Staphylococcus aureus: NEGATIVE

## 2020-05-19 MED ORDER — CHLORHEXIDINE GLUCONATE CLOTH 2 % EX PADS
6.0000 | MEDICATED_PAD | Freq: Once | CUTANEOUS | Status: AC
  Administered 2020-05-19: 6 via TOPICAL

## 2020-05-19 MED ORDER — CHLORHEXIDINE GLUCONATE CLOTH 2 % EX PADS: 6.0000 | MEDICATED_PAD | Freq: Once | CUTANEOUS | Status: AC

## 2020-05-19 MED ORDER — SODIUM CHLORIDE 0.9 % IV SOLN
2.0000 g | INTRAVENOUS | Status: AC
  Administered 2020-05-20: 2 g via INTRAVENOUS
  Filled 2020-05-19 (×2): qty 2

## 2020-05-19 NOTE — Progress Notes (Signed)
Maylon Peppers, M.D. Gastroenterology & Hepatology   Interval History: No acute events overnight. Patient underwent ERCP yesterday, noted to have an erosion at the ampullary orifice.  CBD was noted to be dilated with distal filling defect.  Sphincterotomy was performed and balloon sweeping was performed but no stone noted.  Patient had adequate drainage after procedure was performed. Today, the patient reports feeling well.  Has not taking any food yet but states he is hungry.  Denies any abdominal pain, nausea, vomiting, fever or chills. He is waiting to hear back regarding the timing of his surgery.  Inpatient Medications:  Current Facility-Administered Medications:  .  0.9 %  sodium chloride infusion, , Intravenous, Continuous, Rehman, Najeeb U, MD, Last Rate: 100 mL/hr at 05/19/20 0330, New Bag at 05/19/20 0330 .  cefTRIAXone (ROCEPHIN) 2 g in sodium chloride 0.9 % 100 mL IVPB, 2 g, Intravenous, Q24H, Rehman, Najeeb U, MD, 2 g at 05/18/20 1310 .  HYDROmorphone (DILAUDID) injection 1 mg, 1 mg, Intravenous, Q4H PRN, Rehman, Najeeb U, MD, 1 mg at 05/18/20 0530 .  metoprolol tartrate (LOPRESSOR) injection 5 mg, 5 mg, Intravenous, Q8H, Rehman, Najeeb U, MD, 5 mg at 05/19/20 0608 .  ondansetron (ZOFRAN) tablet 4 mg, 4 mg, Oral, Q6H PRN **OR** ondansetron (ZOFRAN) injection 4 mg, 4 mg, Intravenous, Q6H PRN, Rehman, Najeeb U, MD .  pantoprazole (PROTONIX) injection 40 mg, 40 mg, Intravenous, Q24H, Rehman, Najeeb U, MD, 40 mg at 05/18/20 1048   I/O    Intake/Output Summary (Last 24 hours) at 05/19/2020 0908 Last data filed at 05/19/2020 0300 Gross per 24 hour  Intake 1991.02 ml  Output 0 ml  Net 1991.02 ml     Physical Exam: Temp:  [97.8 F (36.6 C)-98.8 F (37.1 C)] 98.2 F (36.8 C) (09/04 0623) Pulse Rate:  [66-108] 66 (09/04 0623) Resp:  [9-20] 18 (09/04 0623) BP: (118-151)/(56-78) 118/56 (09/04 0623) SpO2:  [92 %-100 %] 95 % (09/04 0623) Weight:  [68.5 kg] 68.5 kg (09/03 1649)   Temp (24hrs), Avg:98.3 F (36.8 C), Min:97.8 F (36.6 C), Max:98.8 F (37.1 C) GENERAL: The patient is AO x3, in no acute distress. Elder. HEENT: Head is normocephalic and atraumatic. EOMI are intact. Mouth is well hydrated and without lesions. NECK: Supple. No masses LUNGS: Clear to auscultation. No presence of rhonchi/wheezing/rales. Adequate chest expansion HEART: RRR, normal s1 and s2. ABDOMEN: Soft, nontender, no guarding, no peritoneal signs, and nondistended. BS +. No masses. EXTREMITIES: Without any cyanosis, clubbing, rash, lesions or edema. NEUROLOGIC: AOx3, no focal motor deficit. SKIN: no jaundice, no rashes   Laboratory Data: CBC:     Component Value Date/Time   WBC 8.2 05/19/2020 0647   RBC 3.90 (L) 05/19/2020 0647   HGB 11.5 (L) 05/19/2020 0647   HCT 35.3 (L) 05/19/2020 0647   PLT 134 (L) 05/19/2020 0647   MCV 90.5 05/19/2020 0647   MCH 29.5 05/19/2020 0647   MCHC 32.6 05/19/2020 0647   RDW 13.8 05/19/2020 0647   LYMPHSABS 0.7 05/19/2020 0647   MONOABS 0.7 05/19/2020 0647   EOSABS 0.0 05/19/2020 0647   BASOSABS 0.0 05/19/2020 0647   COAG: No results found for: INR, PROTIME  BMP:  BMP Latest Ref Rng & Units 05/19/2020 05/18/2020 11/30/2019  Glucose 70 - 99 mg/dL 118(H) 174(H) 112(H)  BUN 8 - 23 mg/dL 16 18 13   Creatinine 0.61 - 1.24 mg/dL 0.67 0.77 0.63(L)  BUN/Creat Ratio 6 - 22 (calc) - - 21  Sodium 135 - 145 mmol/L 138  136 140  Potassium 3.5 - 5.1 mmol/L 3.7 4.4 4.6  Chloride 98 - 111 mmol/L 105 98 104  CO2 22 - 32 mmol/L 23 27 29   Calcium 8.9 - 10.3 mg/dL 8.2(L) 9.7 8.7    HEPATIC:  Hepatic Function Latest Ref Rng & Units 05/19/2020 05/18/2020 11/30/2019  Total Protein 6.5 - 8.1 g/dL 5.8(L) 7.4 5.8(L)  Albumin 3.5 - 5.0 g/dL 3.3(L) 4.4 -  AST 15 - 41 U/L 187(H) 373(H) 21  ALT 0 - 44 U/L 266(H) 273(H) 18  Alk Phosphatase 38 - 126 U/L 217(H) 157(H) -  Total Bilirubin 0.3 - 1.2 mg/dL 3.1(H) 2.5(H) 0.8    CARDIAC: No results found for: CKTOTAL, CKMB,  CKMBINDEX, TROPONINI    Imaging: I personally reviewed and interpreted the available labs, imaging and endoscopic files.   Assessment/Plan: 84 year old male with past medical history of cholelithiasis, GERD, hyperlipidemia, hypertension, myocardial infarction status post 2 stents, who was admitted to the hospital after presenting an episode of acute pancreatitis.  Etiology for pancreatitis is due to gallstones.  Due to this, patient underwent ERCP yesterday after being found to have a filling defect in MRCP.  Successful sphincterotomy was performed with drainage of bile and identification of filling defect in the distal CBD.  Patient has presented resolution of his symptoms without need of pain medication.  Liver enzymes have remained stable, they still have not normalized.  For now, patient can advance diet if not undergoing surgery today.  Timing of cholecystectomy will be determined by surgical service.  We will need to trend liver enzymes on a daily basis.  # Gallstone pancreatitis # Choledocholitiasis -Advance diet today if not undergoing surgical procedure at the moment -Daily liver enzymes -Pain control per primary team -Surgery service input regarding timing for cholecystectomy  Maylon Peppers, MD Gastroenterology and Hepatology [facility]  Note: Occasional unusual wording and randomly placed punctuation marks may result from the use of speech recognition technology to transcribe this document

## 2020-05-19 NOTE — Progress Notes (Addendum)
[facility]   Feeling good and labs overall improved. T bili is slightly up but lipase improved. Feels better. Clears today. NPO midnight.  BP (!) 118/56 (BP Location: Left Arm)   Pulse 66   Temp 98.2 F (36.8 C)   Resp 18   Ht 5\' 8"  (1.727 m)   Wt 68.5 kg   SpO2 95%   BMI 22.96 kg/m   Soft, mildly distended, minimally tender   Patient with gallstone pancreatitis, choledocholithiasis plan for lap chole tomorrow.  PLAN: I counseled the patient about the indication, risks and benefits of laparoscopic cholecystectomy.  He understands there is a very small chance for bleeding, infection, injury to normal structures (including common bile duct), conversion to open surgery, persistent symptoms, evolution of postcholecystectomy diarrhea, need for secondary interventions, anesthesia reaction, cardiopulmonary issues and other risks not specifically detailed here. I described the expected recovery, the plan for follow-up and the restrictions during the recovery phase.  All questions were answered.   Discussed with him and his son.  Lap chole tomorrow around [place], MD [facility] [address] [place], [place] [address] [phone] (office)

## 2020-05-19 NOTE — Progress Notes (Signed)
PROGRESS NOTE  Randy Thomas UXN:[id number] DOB: 10-Nov-1935 DOA: 05/18/2020 PCP: Susy Frizzle, MD   LOS: 1 day   Brief Narrative / Interim history: 84 year old male with history of hypertension, hyperlipidemia, CAD, prior history of MI with stent placement x2, comes to the ED with abdominal/right upper quadrant pain that started the day prior to admission, associated with food.  His initial pain resolved however it recurred after dinner, and this was associated by nausea and couple episodes of emesis.  He has known cholelithiasis, has been followed with general surgery as an outpatient, and per prior notes patient has been trying to avoid cholecystectomy as long as possible.  In the ED he was found to have acute pancreatitis, elevated LFTs and a dilated CBD with concern for choledocholithiasis.  Subjective / 24h Interval events: No complaints says that he is hungry this morning.  Underwent ERCP yesterday.  No abdominal pain, no nausea or vomiting.  No chest pain, no shortness of breath  Assessment & Plan: Principal Problem Acute gallstone pancreatitis, LFT elevation-general surgery as well as gastroenterology consulted.  He underwent an MRCP which showed concern for choledocholithiasis.  He underwent an ERCP on 9/3 with findings of sludge in the biliary tree, now status post biliary sphincterotomy.  He is scheduled to undergo a cholecystectomy on 9/5.  Active problems CAD -hold statin, aspirin, no chest pain and prior to this episode he was in his normal state of health, high-sensitivity troponin negative  Hyperlipidemia-hold statin due to elevated LFTs  Essential hypertension-metoprolol IV  Normocytic anemia-stable, monitor    Scheduled Meds: . Chlorhexidine Gluconate Cloth  6 each Topical Once   And  . Chlorhexidine Gluconate Cloth  6 each Topical Once  . metoprolol tartrate  5 mg Intravenous Q8H  . pantoprazole (PROTONIX) IV  40 mg Intravenous Q24H   Continuous  Infusions: . sodium chloride 100 mL/hr at 05/19/20 0330  . [START ON 05/20/2020] cefoTEtan (CEFOTAN) IV    . cefTRIAXone (ROCEPHIN)  IV 2 g (05/19/20 1140)   PRN Meds:.HYDROmorphone (DILAUDID) injection, ondansetron **OR** ondansetron (ZOFRAN) IV  Diet Orders (From admission, onward)    Start     Ordered   05/20/20 0001  Diet NPO time specified  Diet effective midnight        05/19/20 1010   05/19/20 1010  Diet clear liquid Room service appropriate? Yes; Fluid consistency: Thin  Diet effective now       Question Answer Comment  Room service appropriate? Yes   Fluid consistency: Thin      05/19/20 1010          DVT prophylaxis: SCD's Start: 05/19/20 1218 SCDs Start: 05/18/20 0506     Code Status: Full Code  Family Communication: No family at bedside  Status is: Inpatient  Remains inpatient appropriate because:IV treatments appropriate due to intensity of illness or inability to take PO   Dispo: The patient is from: Home              Anticipated d/c is to: Home              Anticipated d/c date is: 2 days              Patient currently is not medically stable to d/c.  Consultants:  General surgery GI  Procedures:  ERGP 9/3   Microbiology  None   Antimicrobials: Ceftriaxone     Objective: Vitals:   05/18/20 1649 05/18/20 2112 05/19/20 0119 05/19/20 0623  BP: 134/68 131/70 Marland Kitchen)  125/57 (!) 118/56  Pulse:  81 70 66  Resp: 16 18 18 18   Temp: 97.8 F (36.6 C) 98.3 F (36.8 C) 98.3 F (36.8 C) 98.2 F (36.8 C)  TempSrc: Oral     SpO2: 96% 96% 95% 95%  Weight: 68.5 kg     Height: 5\' 8"  (1.727 m)       Intake/Output Summary (Last 24 hours) at 05/19/2020 1250 Last data filed at 05/19/2020 0300 Gross per 24 hour  Intake 1991.02 ml  Output 0 ml  Net 1991.02 ml   Filed Weights   05/18/20 0112 05/18/20 1649  Weight: 68.5 kg 68.5 kg    Examination:  Constitutional: NAD Eyes: no scleral icterus ENMT: Mucous membranes are moist.  Neck: normal,  supple Respiratory: clear to auscultation bilaterally, no wheezing, no crackles. Normal respiratory effort. Cardiovascular: Regular rate and rhythm, no murmurs / rubs / gallops. No LE edema. Abdomen: non distended, no tenderness. Bowel sounds positive.  Musculoskeletal: no clubbing / cyanosis.  Skin: no rashes Neurologic: CN 2-12 grossly intact. Strength 5/5 in all 4.    Data Reviewed: I have independently reviewed following labs and imaging studies   CBC: Recent Labs  Lab 05/18/20 0227 05/19/20 0647  WBC 10.6* 8.2  NEUTROABS  --  6.8  HGB 12.7* 11.5*  HCT 38.2* 35.3*  MCV 91.4 90.5  PLT 159 784*   Basic Metabolic Panel: Recent Labs  Lab 05/18/20 0227 05/19/20 0647  NA 136 138  K 4.4 3.7  CL 98 105  CO2 27 23  GLUCOSE 174* 118*  BUN 18 16  CREATININE 0.77 0.67  CALCIUM 9.7 8.2*   Liver Function Tests: Recent Labs  Lab 05/18/20 0227 05/19/20 0647  AST 373* 187*  ALT 273* 266*  ALKPHOS 157* 217*  BILITOT 2.5* 3.1*  PROT 7.4 5.8*  ALBUMIN 4.4 3.3*   Coagulation Profile: No results for input(s): INR, PROTIME in the last 168 hours. HbA1C: No results for input(s): HGBA1C in the last 72 hours. CBG: No results for input(s): GLUCAP in the last 168 hours.  Recent Results (from the past 240 hour(s))  SARS Coronavirus 2 by RT PCR (hospital order, performed in [facility] hospital lab) Nasopharyngeal Nasopharyngeal Swab     Status: None   Collection Time: 05/18/20  4:56 AM   Specimen: Nasopharyngeal Swab  Result Value Ref Range Status   SARS Coronavirus 2 NEGATIVE NEGATIVE Final    Comment: (NOTE) SARS-CoV-2 target nucleic acids are NOT DETECTED.  The SARS-CoV-2 RNA is generally detectable in upper and lower respiratory specimens during the acute phase of infection. The lowest concentration of SARS-CoV-2 viral copies this assay can detect is 250 copies / mL. A negative result does not preclude SARS-CoV-2 infection and should not be used as the sole basis for  treatment or other patient management decisions.  A negative result may occur with improper specimen collection / handling, submission of specimen other than nasopharyngeal swab, presence of viral mutation(s) within the areas targeted by this assay, and inadequate number of viral copies (<250 copies / mL). A negative result must be combined with clinical observations, patient history, and epidemiological information.  Fact Sheet for Patients:   StrictlyIdeas.no  Fact Sheet for Healthcare Providers: BankingDealers.co.za  This test is not yet approved or  cleared by the [place] FDA and has been authorized for detection and/or diagnosis of SARS-CoV-2 by FDA under an Emergency Use Authorization (EUA).  This EUA will remain in effect (meaning this test can be  used) for the duration of the COVID-19 declaration under Section 564(b)(1) of the Act, 21 U.S.C. section 360bbb-3(b)(1), unless the authorization is terminated or revoked sooner.  Performed at [facility], [address], [place], [place] [address]      Radiology Studies: DG ERCP  Result Date: 05/18/2020 CLINICAL DATA:  ERCP. EXAM: ERCP TECHNIQUE: Multiple spot images obtained with the fluoroscopic device and submitted for interpretation post-procedure. COMPARISON:  MRCP-05/18/2020 FINDINGS: Multiple fluoroscopic images the right upper abdominal quadrant during ERCP are provided for review. Initial images demonstrate selective cannulation opacification of the common bile duct which appears moderately dilated. There is a nonocclusive lentiform filling defect within the distal aspect of the CBD potentially indicative choledocholithiasis. Subsequent images demonstrate insufflation of a balloon within the CBD with subsequent biliary sweeping and presumed sphincterotomy. IMPRESSION: ERCP with biliary sweeping and presumed sphincterotomy. These images were submitted for radiologic  interpretation only. Please see the procedural report for the amount of contrast and the fluoroscopy time utilized. Electronically Signed   By: Sandi Mariscal M.D.   On: 05/18/2020 14:23    Marzetta Board, MD, PhD [facility]  Between 7 am - 7 pm I am available, please contact me via Amion or Securechat  Between 7 pm - 7 am I am not available, please contact night coverage MD/APP via Amion

## 2020-05-20 ENCOUNTER — Inpatient Hospital Stay (HOSPITAL_COMMUNITY): Payer: No Typology Code available for payment source

## 2020-05-20 ENCOUNTER — Inpatient Hospital Stay (HOSPITAL_COMMUNITY): Payer: No Typology Code available for payment source | Admitting: Anesthesiology

## 2020-05-20 ENCOUNTER — Encounter (HOSPITAL_COMMUNITY)
Admission: EM | Disposition: A | Discharge status: Other Acute Inpatient Hospital | Payer: Self-pay | Source: Home / Self Care | Attending: Internal Medicine

## 2020-05-20 DIAGNOSIS — I2510 Atherosclerotic heart disease of native coronary artery without angina pectoris: Secondary | ICD-10-CM

## 2020-05-20 DIAGNOSIS — K8013 Calculus of gallbladder with acute and chronic cholecystitis with obstruction: Secondary | ICD-10-CM

## 2020-05-20 DIAGNOSIS — S3613XA Injury of bile duct, initial encounter: Secondary | ICD-10-CM

## 2020-05-20 DIAGNOSIS — K831 Obstruction of bile duct: Secondary | ICD-10-CM | POA: Insufficient documentation

## 2020-05-20 DIAGNOSIS — R410 Disorientation, unspecified: Secondary | ICD-10-CM | POA: Diagnosis not present

## 2020-05-20 DIAGNOSIS — K9171 Accidental puncture and laceration of a digestive system organ or structure during a digestive system procedure: Secondary | ICD-10-CM | POA: Diagnosis not present

## 2020-05-20 DIAGNOSIS — Z9049 Acquired absence of other specified parts of digestive tract: Secondary | ICD-10-CM | POA: Diagnosis not present

## 2020-05-20 DIAGNOSIS — E785 Hyperlipidemia, unspecified: Secondary | ICD-10-CM | POA: Diagnosis not present

## 2020-05-20 DIAGNOSIS — I252 Old myocardial infarction: Secondary | ICD-10-CM | POA: Insufficient documentation

## 2020-05-20 DIAGNOSIS — K851 Biliary acute pancreatitis without necrosis or infection: Secondary | ICD-10-CM | POA: Diagnosis not present

## 2020-05-20 DIAGNOSIS — K660 Peritoneal adhesions (postprocedural) (postinfection): Secondary | ICD-10-CM | POA: Diagnosis not present

## 2020-05-20 DIAGNOSIS — K838 Other specified diseases of biliary tract: Secondary | ICD-10-CM

## 2020-05-20 DIAGNOSIS — Z09 Encounter for follow-up examination after completed treatment for conditions other than malignant neoplasm: Secondary | ICD-10-CM

## 2020-05-20 DIAGNOSIS — I10 Essential (primary) hypertension: Secondary | ICD-10-CM | POA: Diagnosis not present

## 2020-05-20 DIAGNOSIS — R918 Other nonspecific abnormal finding of lung field: Secondary | ICD-10-CM | POA: Diagnosis not present

## 2020-05-20 DIAGNOSIS — Z9889 Other specified postprocedural states: Secondary | ICD-10-CM | POA: Diagnosis not present

## 2020-05-20 DIAGNOSIS — R140 Abdominal distension (gaseous): Secondary | ICD-10-CM | POA: Diagnosis not present

## 2020-05-20 DIAGNOSIS — K219 Gastro-esophageal reflux disease without esophagitis: Secondary | ICD-10-CM | POA: Diagnosis not present

## 2020-05-20 DIAGNOSIS — K9189 Other postprocedural complications and disorders of digestive system: Secondary | ICD-10-CM

## 2020-05-20 HISTORY — PX: CHOLECYSTECTOMY: SHX55

## 2020-05-20 HISTORY — PX: ERCP: SHX5425

## 2020-05-20 LAB — CBC WITH DIFFERENTIAL/PLATELET
Abs Immature Granulocytes: 0.02 10*3/uL (ref 0.00–0.07)
Basophils Absolute: 0 10*3/uL (ref 0.0–0.1)
Basophils Relative: 1 %
Eosinophils Absolute: 0.2 10*3/uL (ref 0.0–0.5)
Eosinophils Relative: 3 %
HCT: 34.6 % — ABNORMAL LOW (ref 39.0–52.0)
Hemoglobin: 11.2 g/dL — ABNORMAL LOW (ref 13.0–17.0)
Immature Granulocytes: 0 %
Lymphocytes Relative: 20 %
Lymphs Abs: 1.6 10*3/uL (ref 0.7–4.0)
MCH: 29.8 pg (ref 26.0–34.0)
MCHC: 32.4 g/dL (ref 30.0–36.0)
MCV: 92 fL (ref 80.0–100.0)
Monocytes Absolute: 0.9 10*3/uL (ref 0.1–1.0)
Monocytes Relative: 11 %
Neutro Abs: 5.2 10*3/uL (ref 1.7–7.7)
Neutrophils Relative %: 65 %
Platelets: 142 10*3/uL — ABNORMAL LOW (ref 150–400)
RBC: 3.76 MIL/uL — ABNORMAL LOW (ref 4.22–5.81)
RDW: 14.2 % (ref 11.5–15.5)
WBC: 8.1 10*3/uL (ref 4.0–10.5)
nRBC: 0 % (ref 0.0–0.2)

## 2020-05-20 LAB — COMPREHENSIVE METABOLIC PANEL
ALT: 181 U/L — ABNORMAL HIGH (ref 0–44)
AST: 79 U/L — ABNORMAL HIGH (ref 15–41)
Albumin: 3.5 g/dL (ref 3.5–5.0)
Alkaline Phosphatase: 178 U/L — ABNORMAL HIGH (ref 38–126)
Anion gap: 8 (ref 5–15)
BUN: 13 mg/dL (ref 8–23)
CO2: 25 mmol/L (ref 22–32)
Calcium: 8.4 mg/dL — ABNORMAL LOW (ref 8.9–10.3)
Chloride: 106 mmol/L (ref 98–111)
Creatinine, Ser: 0.62 mg/dL (ref 0.61–1.24)
GFR calc Af Amer: >60 mL/min (ref >60)
GFR calc non Af Amer: >60 mL/min (ref >60)
Glucose, Bld: 90 mg/dL (ref 70–99)
Potassium: 3.6 mmol/L (ref 3.5–5.1)
Sodium: 139 mmol/L (ref 135–145)
Total Bilirubin: 1.7 mg/dL — ABNORMAL HIGH (ref 0.3–1.2)
Total Protein: 5.8 g/dL — ABNORMAL LOW (ref 6.5–8.1)

## 2020-05-20 LAB — LIPASE, BLOOD: Lipase: 46 U/L (ref 11–51)

## 2020-05-20 LAB — GLUCOSE, CAPILLARY: Glucose-Capillary: 177 mg/dL — ABNORMAL HIGH (ref 70–99)

## 2020-05-20 SURGERY — LAPAROSCOPIC CHOLECYSTECTOMY
Anesthesia: General

## 2020-05-20 MED ORDER — DEXAMETHASONE SODIUM PHOSPHATE 10 MG/ML IJ SOLN
INTRAMUSCULAR | Status: DC | PRN
  Administered 2020-05-20: 5 mg via INTRAVENOUS

## 2020-05-20 MED ORDER — HEMOSTATIC AGENTS (NO CHARGE) OPTIME
TOPICAL | Status: DC | PRN
  Administered 2020-05-20: 1 via TOPICAL

## 2020-05-20 MED ORDER — LACTATED RINGERS IV SOLN: INTRAVENOUS | Status: DC | PRN

## 2020-05-20 MED ORDER — ALPRAZOLAM 0.25 MG PO TABS
0.2500 mg | ORAL_TABLET | Freq: Once | ORAL | Status: DC | PRN
  Filled 2020-05-20: qty 1

## 2020-05-20 MED ORDER — ONDANSETRON HCL 4 MG/2ML IJ SOLN: 4.0000 mg | Freq: Once | INTRAMUSCULAR | Status: DC | PRN

## 2020-05-20 MED ORDER — MIDAZOLAM HCL 5 MG/5ML IJ SOLN
INTRAMUSCULAR | Status: DC | PRN
  Administered 2020-05-20: 1 mg via INTRAVENOUS

## 2020-05-20 MED ORDER — GLUCAGON HCL RDNA (DIAGNOSTIC) 1 MG IJ SOLR
INTRAMUSCULAR | Status: AC
  Filled 2020-05-20: qty 1

## 2020-05-20 MED ORDER — PROPOFOL 10 MG/ML IV BOLUS
INTRAVENOUS | Status: DC | PRN
  Administered 2020-05-20: 100 mg via INTRAVENOUS
  Administered 2020-05-20 (×2): 20 mg via INTRAVENOUS

## 2020-05-20 MED ORDER — LACTATED RINGERS IV SOLN: Freq: Once | INTRAVENOUS | Status: DC

## 2020-05-20 MED ORDER — PROPOFOL 10 MG/ML IV BOLUS
INTRAVENOUS | Status: AC
  Filled 2020-05-20: qty 20

## 2020-05-20 MED ORDER — BUPIVACAINE HCL (PF) 0.5 % IJ SOLN
INTRAMUSCULAR | Status: AC
  Filled 2020-05-20: qty 30

## 2020-05-20 MED ORDER — ESMOLOL HCL 100 MG/10ML IV SOLN
INTRAVENOUS | Status: AC
  Filled 2020-05-20: qty 10

## 2020-05-20 MED ORDER — DEXAMETHASONE SODIUM PHOSPHATE 10 MG/ML IJ SOLN
INTRAMUSCULAR | Status: AC
  Filled 2020-05-20: qty 1

## 2020-05-20 MED ORDER — FENTANYL CITRATE (PF) 100 MCG/2ML IJ SOLN
INTRAMUSCULAR | Status: AC
Start: 2020-05-20 — End: ?
  Filled 2020-05-20: qty 2

## 2020-05-20 MED ORDER — ROCURONIUM BROMIDE 10 MG/ML (PF) SYRINGE
PREFILLED_SYRINGE | INTRAVENOUS | Status: AC
  Filled 2020-05-20: qty 10

## 2020-05-20 MED ORDER — FENTANYL CITRATE (PF) 100 MCG/2ML IJ SOLN
INTRAMUSCULAR | Status: DC | PRN
Start: 2020-05-20 — End: 2020-05-20
  Administered 2020-05-20: 50 ug via INTRAVENOUS
  Administered 2020-05-20: 25 ug via INTRAVENOUS
  Administered 2020-05-20 (×4): 50 ug via INTRAVENOUS

## 2020-05-20 MED ORDER — ONDANSETRON HCL 4 MG/2ML IJ SOLN
INTRAMUSCULAR | Status: DC | PRN
  Administered 2020-05-20: 4 mg via INTRAVENOUS

## 2020-05-20 MED ORDER — MIDAZOLAM HCL 2 MG/2ML IJ SOLN
INTRAMUSCULAR | Status: AC
  Filled 2020-05-20: qty 2

## 2020-05-20 MED ORDER — LIDOCAINE HCL (CARDIAC) PF 100 MG/5ML IV SOSY
PREFILLED_SYRINGE | INTRAVENOUS | Status: DC | PRN
  Administered 2020-05-20: 100 mg via INTRATRACHEAL

## 2020-05-20 MED ORDER — BUPIVACAINE HCL (PF) 0.5 % IJ SOLN
INTRAMUSCULAR | Status: DC | PRN
  Administered 2020-05-20: 8 mL

## 2020-05-20 MED ORDER — LIDOCAINE 2% (20 MG/ML) 5 ML SYRINGE
INTRAMUSCULAR | Status: AC
  Filled 2020-05-20: qty 5

## 2020-05-20 MED ORDER — ROCURONIUM BROMIDE 10 MG/ML (PF) SYRINGE
PREFILLED_SYRINGE | INTRAVENOUS | Status: DC | PRN
  Administered 2020-05-20: 20 mg via INTRAVENOUS
  Administered 2020-05-20: 40 mg via INTRAVENOUS
  Administered 2020-05-20: 10 mg via INTRAVENOUS
  Administered 2020-05-20: 20 mg via INTRAVENOUS

## 2020-05-20 MED ORDER — ONDANSETRON HCL 4 MG/2ML IJ SOLN
INTRAMUSCULAR | Status: AC
  Filled 2020-05-20: qty 2

## 2020-05-20 MED ORDER — ALUM & MAG HYDROXIDE-SIMETH 200-200-20 MG/5ML PO SUSP
15.0000 mL | Freq: Four times a day (QID) | ORAL | Status: DC | PRN
  Administered 2020-05-20: 15 mL via ORAL
  Filled 2020-05-20: qty 30

## 2020-05-20 MED ORDER — FENTANYL CITRATE (PF) 250 MCG/5ML IJ SOLN
INTRAMUSCULAR | Status: AC
Start: 2020-05-20 — End: ?
  Filled 2020-05-20: qty 5

## 2020-05-20 MED ORDER — CHLORHEXIDINE GLUCONATE 0.12 % MT SOLN: 15.0000 mL | Freq: Once | OROMUCOSAL | Status: DC

## 2020-05-20 MED ORDER — SUGAMMADEX SODIUM 200 MG/2ML IV SOLN
INTRAVENOUS | Status: DC | PRN
  Administered 2020-05-20: 200 mg via INTRAVENOUS

## 2020-05-20 MED ORDER — GLUCAGON HCL RDNA (DIAGNOSTIC) 1 MG IJ SOLR
INTRAMUSCULAR | Status: DC | PRN
  Administered 2020-05-20: .25 mg via INTRAVENOUS

## 2020-05-20 MED ORDER — ORAL CARE MOUTH RINSE: 15.0000 mL | Freq: Once | OROMUCOSAL | Status: DC

## 2020-05-20 MED ORDER — SODIUM CHLORIDE 0.9 % IR SOLN
Status: DC | PRN
  Administered 2020-05-20: 1000 mL

## 2020-05-20 MED ORDER — PANTOPRAZOLE SODIUM 40 MG IV SOLR
40.0000 mg | INTRAVENOUS | Status: DC
  Administered 2020-05-20: 40 mg via INTRAVENOUS

## 2020-05-20 MED ORDER — SODIUM CHLORIDE (PF) 0.9 % IJ SOLN
INTRAMUSCULAR | Status: DC | PRN
  Administered 2020-05-20: 10 mL

## 2020-05-20 MED ORDER — HYDROMORPHONE HCL 1 MG/ML IJ SOLN: 0.2500 mg | INTRAMUSCULAR | Status: DC | PRN

## 2020-05-20 SURGICAL SUPPLY — 58 items
ADH SKN CLS APL DERMABOND .7 (GAUZE/BANDAGES/DRESSINGS) ×1
APL PRP STRL LF DISP 70% ISPRP (MISCELLANEOUS) ×1
APL SRG 38 LTWT LNG FL B (MISCELLANEOUS) ×1
APPLICATOR ARISTA FLEXITIP XL (MISCELLANEOUS) ×2 IMPLANT
APPLIER CLIP ROT 10 11.4 M/L (STAPLE)
APR CLP MED LRG 11.4X10 (STAPLE)
BAG RETRIEVAL 10 (BASKET) ×1
BAG RETRIEVAL 10MM (BASKET) ×1
BLADE SURG 15 STRL LF DISP TIS (BLADE) ×1 IMPLANT
BLADE SURG 15 STRL SS (BLADE) ×3
CHLORAPREP W/TINT 26 (MISCELLANEOUS) ×3 IMPLANT
CLIP APPLIE ROT 10 11.4 M/L (STAPLE) ×1 IMPLANT
CLOTH BEACON ORANGE TIMEOUT ST (SAFETY) ×3 IMPLANT
COVER LIGHT HANDLE STERIS (MISCELLANEOUS) ×6 IMPLANT
COVER WAND RF STERILE (DRAPES) ×3 IMPLANT
DECANTER SPIKE VIAL GLASS SM (MISCELLANEOUS) ×3 IMPLANT
DERMABOND ADVANCED (GAUZE/BANDAGES/DRESSINGS) ×2
DERMABOND ADVANCED .7 DNX12 (GAUZE/BANDAGES/DRESSINGS) ×1 IMPLANT
DISSECTOR BLUNT TIP ENDO 5MM (MISCELLANEOUS) ×2 IMPLANT
ELECT REM PT RETURN 9FT ADLT (ELECTROSURGICAL) ×3
ELECTRODE REM PT RTRN 9FT ADLT (ELECTROSURGICAL) ×1 IMPLANT
GLOVE BIO SURGEON STRL SZ 6.5 (GLOVE) ×3 IMPLANT
GLOVE BIO SURGEONS STRL SZ 6.5 (GLOVE) ×2
GLOVE BIOGEL PI IND STRL 6.5 (GLOVE) ×1 IMPLANT
GLOVE BIOGEL PI IND STRL 7.0 (GLOVE) ×3 IMPLANT
GLOVE BIOGEL PI IND STRL 7.5 (GLOVE) IMPLANT
GLOVE BIOGEL PI INDICATOR 6.5 (GLOVE) ×4
GLOVE BIOGEL PI INDICATOR 7.0 (GLOVE) ×8
GLOVE BIOGEL PI INDICATOR 7.5 (GLOVE) ×2
GLOVE ECLIPSE 7.0 STRL STRAW (GLOVE) ×2 IMPLANT
GOWN STRL REUS W/TWL LRG LVL3 (GOWN DISPOSABLE) ×9 IMPLANT
HEMOSTAT ARISTA ABSORB 3G PWDR (HEMOSTASIS) ×2 IMPLANT
HEMOSTAT SNOW SURGICEL 2X4 (HEMOSTASIS) ×3 IMPLANT
INST SET LAPROSCOPIC AP (KITS) ×3 IMPLANT
IV NS IRRIG 3000ML ARTHROMATIC (IV SOLUTION) ×2 IMPLANT
KIT TURNOVER KIT A (KITS) ×3 IMPLANT
MANIFOLD NEPTUNE II (INSTRUMENTS) ×3 IMPLANT
NDL INSUFFLATION 14GA 120MM (NEEDLE) ×1 IMPLANT
NEEDLE INSUFFLATION 14GA 120MM (NEEDLE) ×3 IMPLANT
NS IRRIG 1000ML POUR BTL (IV SOLUTION) ×3 IMPLANT
PACK LAP CHOLE LZT030E (CUSTOM PROCEDURE TRAY) ×3 IMPLANT
PAD ARMBOARD 7.5X6 YLW CONV (MISCELLANEOUS) ×3 IMPLANT
SET BASIN LINEN APH (SET/KITS/TRAYS/PACK) ×3 IMPLANT
SET TUBE IRRIG SUCTION NO TIP (IRRIGATION / IRRIGATOR) ×2 IMPLANT
SET TUBE SMOKE EVAC HIGH FLOW (TUBING) ×3 IMPLANT
SLEEVE ENDOPATH XCEL 5M (ENDOMECHANICALS) ×3 IMPLANT
SPONGE DRAIN TRACH 4X4 STRL 2S (GAUZE/BANDAGES/DRESSINGS) ×2 IMPLANT
SUT ETHIBOND NAB MO 7 #0 18IN (SUTURE) ×2 IMPLANT
SUT MNCRL AB 4-0 PS2 18 (SUTURE) ×6 IMPLANT
SUT VICRYL 0 UR6 27IN ABS (SUTURE) ×3 IMPLANT
SYS BAG RETRIEVAL 10MM (BASKET) ×1
SYSTEM BAG RETRIEVAL 10MM (BASKET) ×1 IMPLANT
TROCAR ENDO BLADELESS 11MM (ENDOMECHANICALS) ×3 IMPLANT
TROCAR XCEL NON-BLD 5MMX100MML (ENDOMECHANICALS) ×3 IMPLANT
TROCAR XCEL UNIV SLVE 11M 100M (ENDOMECHANICALS) ×3 IMPLANT
TUBE CONNECTING 12'X1/4 (SUCTIONS) ×1
TUBE CONNECTING 12X1/4 (SUCTIONS) ×2 IMPLANT
WARMER LAPAROSCOPE (MISCELLANEOUS) ×3 IMPLANT

## 2020-05-20 NOTE — Op Note (Addendum)
Operative Note   Preoperative Diagnosis: Gallstone pancreatitis, choledocholithiasis    Postoperative Diagnosis: Gallstone pancreatitis, choledocholithiasis, common bile duct transection with clips / hepatic duct cautery injury    Procedure(s) Performed: Laparoscopic cholecystectomy; (ERCP by Dr. Laural Golden- see separate documentation); primary repair of umbilical hernia    Surgeon: Lanell Matar. Constance Haw, MD   Assistants: No qualified resident was available; Intraoperative call to Dr. Arnoldo Morale who came in after injury occurred and Dr. Laural Golden who came in to perform ERCP   Anesthesia: General endotracheal   Anesthesiologist: Dr. Wyatt Haste    Specimens: Gallbladder    Estimated Blood Loss: Minimal    Blood Replacement: None    Complications: None   Drain: JP drain in the right lateral port site   Operative Findings:  Inflamed, contracted gallbladder with fusion of the inferior edge of the gallbladder wall and hepatic duct above the cystic duct branch off         Procedure: The patient was taken to the operating room and placed supine. General endotracheal anesthesia was induced. Intravenous antibiotics were administered per protocol. An orogastric tube positioned to decompress the stomach. The abdomen was prepared and draped in the usual sterile fashion.    A infraumbilical  incision was made due to patient's small umbilical hernia and desired to have his umbilical hernia repaired. and a Veress technique was utilized to achieve pneumoperitoneum to 15 mmHg with carbon dioxide. A 11 mm optiview port was placed through the supraumbilical region, and a 10 mm 0-degree operative laparoscope was introduced. The area underlying the trocar and Veress needle were inspected and without evidence of injury.  Remaining trocars were placed under direct vision. Two 5 mm ports were placed in the right abdomen, between the anterior axillary and midclavicular line.  A final 11 mm port was placed through the  mid-epigastrium, near the falciform ligament.   There was significant adhesions to the liver and gallbladder to the anterior abdominal wall with omentum adherent to the entire edge of the liver.  Using hook cautery, I was able to get the omentum off the liver and  gallbladder which was retracted down and contracted intrahepatic. The edge of liver was adhesed to the anterior abdominal wall and this was taken down too with cautery to allow for the gallbladder to be retracted what little it would go above the liver. We changed to a 30 degree scope for better visualization.   The patient was placed in reverse Trendelenburg with the right side up. The gallbladder was really retracted down and I tried to retract the infundibulum as much as I could but was having issues identifying the lateral edge of the gallbladder. Because it was so contracted in, I started to dome down and did get into the back wall of the gallbladder but was high up on the hepatic plate. I encountered some bleeding of the hepatic plate at that time because the gallbladder was so contracted and intrahepatic, but I cauterized this and the bleeding stopped. With continued dissection medial and lateral bluntly with suction, I was able to clear the inferior edge of the gallbladder laterally, and I identified the lateral edge.    I started to see two structures appear that were going into the gallbladder and was able to skeletonize them completely.  I had what I thought was the cystic duct and cystic artery skeletonized with hepatic plate behind and no other duct structures diving back. I had obtained the critical view of safety.    The cystic  duct and cystic artery were doubly clipped and divided. There was a small piece of fatty tissue just on the edge of the cystic artery clip that was bleeding and I clipped this to prevent bleeding. The gallbladder was already off the hepatic bed. The specimen was placed in an Endopouch and was retrieved through  the epigastric site.  I suctioned the hepatic plate and I was worried that I was seeing some bile and not just hepatic plate bleeding, and opened the gallbladder on the back table. There I noted two lumens above my clip on the gallbladder and what appeared to be a duct structure fused to the inferior edge of the gallbladder but without any obvious sinus tracks between. Given this a hepatic duct/ common bile duct injury was likely.  I immediately called my partner, Dr. Arnoldo Morale who came in to further assist, and we called in Dr. Laural Golden for an intraoperative ERCP. I briefly scrubbed out and notified the daughter, Orbie Hurst about the difficult anatomy and concern for bile duct injury, and told her we were planning for ERCP.   When Dr. Arnoldo Morale arrive he inspected, and we could not find an obvious source of bile in the hepatic plate/ hepatic bed but felt that there was possibly bile coming down. A JP drain was placed in the hepatic fossa to collect any bile and was secured with a 3-0 Nylon.  There was no signs of bleeding and the liver looked healthy without any signs of demarcation or ischemia.   Trocars were removed and pneumoperitoneum was released.  The epigastric site was closed with 0 Vicryl suture and the umbilical port site/ fascial defect was closed with 0 Ethibond given the prior hernia. Skin incisions were closed with 4-0 Monocryl subcuticular sutures and Dermabond.   The patient was then repositioned with pressure points padded in he semi prone position for ERCP.  Dr. Laural Golden completed an intraoperative ERCP and confirmed distal transection of the common bile duct with clips across (see separate documentation).  Following the complication of this part of the the procedure, the patient was awakened from anesthesia and extubated without complication.   I discussed the injury with the family immediately following confirmation and called [facility] for transfer for definitive treatment/ repair.    Curlene Labrum, MD [facility] [address] [place], [place] [address] [phone] (office)

## 2020-05-20 NOTE — Progress Notes (Signed)
Brief ERCP note:  Ampullary edema and evidence of biliary sphincterotomy.. Occlusion cholangiogram reveal cut-off/ obstruction at level of CHD at the level of clips. Patient tolerated procedure well.

## 2020-05-20 NOTE — Progress Notes (Signed)
[facility]  Printed out notes for [facility] and CD burned. They will also push images. Answered additional questions from Calvin.  Curlene Labrum, MD [facility] [address] [place], [place] [address] [phone] (office)

## 2020-05-20 NOTE — Anesthesia Procedure Notes (Signed)
Procedure Name: Intubation Date/Time: 05/20/2020 8:40 AM Performed by: Denese Killings, MD Pre-anesthesia Checklist: Patient identified, Emergency Drugs available, Suction available and Patient being monitored Patient Re-evaluated:Patient Re-evaluated prior to induction Oxygen Delivery Method: Circle system utilized Preoxygenation: Pre-oxygenation with 100% oxygen Induction Type: IV induction Ventilation: Mask ventilation without difficulty Grade View: Grade I Tube type: Oral Tube size: 7.5 mm Number of attempts: 1 Airway Equipment and Method: Stylet and Oral airway Placement Confirmation: ETT inserted through vocal cords under direct vision,  positive ETCO2 and breath sounds checked- equal and bilateral Secured at: 23 cm Tube secured with: Tape Dental Injury: Teeth and Oropharynx as per pre-operative assessment

## 2020-05-20 NOTE — Discharge Summary (Addendum)
Physician Discharge Summary  Randy Thomas BSJ:[id number] DOB: 22-Jul-1936 DOA: 05/18/2020  PCP: Randy Frizzle, MD  Admit date: 05/18/2020 Discharge date: 05/20/2020  Admitted From: home Disposition:  Transfer to [place], accepting MD Dr Natividad Brood  Recommendations for Outpatient Follow-up:  Currently transferred to tertiary center  Home Health: none Equipment/Devices: none  Discharge Condition: stable CODE STATUS: Full code Diet recommendation: NPO for now  HPI: Per admitting MD, Randy Thomas is a 84 y.o. male with medical history significant of allergies, GERD, hyperlipidemia, hypertension, CAD, history of MI, history of stent placement x2 who is coming to the emergency department with complaints of abdominal pain and RUQ/epigastric area that started at 1600 yesterday  associated with nausea and 2 episodes of emesis later in the evening.  The patient states that he ate a vegetable omelette for brunch yesterday around 11 or 11:30 in the morning.  He did not have any symptoms until he developed acute epigastric/RUQ pain that slowly improved over an hour and then resolve to the point that he was able to eat mashed potatoes with no gravy and vegetables.  Around 2230, he developed pain again, this time it was associated with nausea and 2 episodes of emesis.  In March of this year, he saw Dr. Arnoldo Morale for a similar pain and was diagnosed with cholelithiasis.  He denies hematemesis, diarrhea, constipation, melena or hematochezia.  No flank or suprapubic pain, dysuria, frequency or hematuria.  He denied fever, rhinorrhea, sore throat, dyspnea, wheezing or hemoptysis.  No chest pain, palpitations, diaphoresis, PND, orthopnea or pitting edema of the lower extremities.  He denies polyuria, polydipsia, polyphagia or blurred vision.  Hospital Course / Discharge diagnoses: Principal Problem Acute gallstone pancreatitis, LFT elevation-patient was admitted to the hospital with acute pancreatitis in the  setting of known cholelithiasis.  He underwent imaging on admission with CT and MRCP which showed CBD dilation with concern for impacted stone.  Gastroenterology as well as general surgery were consulted.  Patient underwent an ERCP on 9/3 with findings of only sludge in the biliary tree, status post biliary sphincterotomy.  He was taken to the OR on 9/5 and underwent laparoscopic cholecystectomy, this was complicated by CBD injury due to unusual anatomy.  General surgery did reach out to tertiary centers for transfer and he was accepted to [facility]. Will be transferred in stable condition  Active problems CAD-hold statin, aspirin, no chest pain and prior to this episode he was in his normal state of health, high-sensitivity troponin negative Hyperlipidemia-hold statin due to elevated LFTs Essential hypertension-metoprolol IV Normocytic anemia-stable, monitor  Discharge Instructions   Allergies as of 05/20/2020   No Known Allergies     Medication List    STOP taking these medications   acetaminophen 650 MG CR tablet Commonly known as: Tylenol Arthritis Pain   cetirizine 10 MG tablet Commonly known as: ZYRTEC   lansoprazole 30 MG capsule Commonly known as: PREVACID   neomycin-bacitracin-polymyxin [phone] ointment   rosuvastatin 40 MG tablet Commonly known as: CRESTOR     TAKE these medications   aspirin EC 81 MG tablet Take 1 tablet (81 mg total) by mouth daily.   cholecalciferol 1000 units tablet Commonly known as: VITAMIN D Take 1 tablet (1,000 Units total) by mouth daily.   Fish Oil 1000 MG Caps Take 2 capsules by mouth 2 (two) times daily.   HYDROcodone-acetaminophen 5-325 MG tablet Commonly known as: NORCO/VICODIN Take 1 tablet by mouth every 6 (six) hours as needed.  methocarbamol 750 MG tablet Commonly known as: ROBAXIN Take 1 tablet (750 mg total) by mouth 4 (four) times daily. What changed: additional instructions   metoprolol succinate 50 MG 24 hr  tablet Commonly known as: TOPROL-XL TAKE 1/2 TABLET BY MOUTH IN THE MORNING AND 1/2 TABLET BY MOUTH IN THE EVENING   multivitamin with minerals Tabs tablet Take 1 tablet by mouth daily.   pantoprazole 40 MG tablet Commonly known as: PROTONIX Take 40 mg by mouth daily.   Vascepa 1 g capsule Generic drug: icosapent Ethyl Take 1 capsule by mouth in the morning and at bedtime.   vitamin B-12 50 MCG tablet Commonly known as: CYANOCOBALAMIN Take 50 mcg by mouth daily.   vitamin C 1000 MG tablet Take 1,000 mg by mouth daily.      Consultations:  GI  General surgery   Procedures/Studies:  ERCP 9/3 Impression:               - A filling defect consistent with a stone was seen                            on the cholangiogram.                           - The common bile duct and common hepatic duct were                            mildly dilated.                           - A biliary sphincterotomy was performed.                           - The biliary tree was swept and sludge and mucus                            were found.  ERCP 9/5 Impression:               - The major papilla appeared congested secodart to                            recent biliary sphincyrtotomy.                           - A biliary tract obstruction secondary to clips                            from previous cholecystectomy was found in the                             upper third of the main duct.   CT ABDOMEN PELVIS W CONTRAST  Result Date: 05/18/2020 CLINICAL DATA:  Abdominal pain with biliary obstruction suspected. EXAM: CT ABDOMEN AND PELVIS WITH CONTRAST TECHNIQUE: Multidetector CT imaging of the abdomen and pelvis was performed using the standard protocol following bolus administration of intravenous contrast. CONTRAST:  133mL OMNIPAQUE IOHEXOL 300 MG/ML  SOLN COMPARISON:  12/01/2019 FINDINGS: Lower chest:  Coronary and aortic atherosclerosis.  Mild atelectasis Hepatobiliary: No focal  liver  abnormality.No evidence of biliary obstruction or stone. Nodular high-density at the ampulla measuring 9 mm, noncalcified. The upstream CBD is notably not dilated, which favors acute obstruction from calculus rather than mass. Pancreas: Regional retroperitoneal edema but no clear parenchymal expansion and no pancreatic collection. Spleen: Unremarkable. Adrenals/Urinary Tract: Negative adrenals. No hydronephrosis or stone. Small low-density renal lesions that are too small for accurate densitometry. Unremarkable bladder. Stomach/Bowel: No obstruction. Upper retroperitoneal edema which is in proximity to the distal duodenum, which shows no wall thickening or discrete ulceration. Vascular/Lymphatic: No acute vascular abnormality. Widespread atheromatous calcification. No mass or adenopathy. Reproductive:No pathologic findings. Other: No ascites or pneumoperitoneum.  Fatty umbilical hernia. Musculoskeletal: No acute abnormalities. Diffuse degenerative disease with mild lumbar levoscoliosis. IMPRESSION: 1. Epigastric retroperitoneal edema compatible with the acute pancreatitis by labs. No collection or necrosis. 2. Nodular high-density at the ampulla of the CBD without ductal dilatation, suspicious for choledocholithiasis. Suggest MRCP. Electronically Signed   By: Monte Fantasia M.D.   On: 05/18/2020 04:47   MR 3D Recon At Scanner  Result Date: 05/18/2020 CLINICAL DATA:  Severe pancreatitis, right upper quadrant pain, nausea, vomiting, choledocholithiasis suspected on prior CT EXAM: MRI ABDOMEN WITHOUT AND WITH CONTRAST (INCLUDING MRCP) TECHNIQUE: Multiplanar multisequence MR imaging of the abdomen was performed both before and after the administration of intravenous contrast. Heavily T2-weighted images of the biliary and pancreatic ducts were obtained, and three-dimensional MRCP images were rendered by post processing. CONTRAST:  27mL GADAVIST GADOBUTROL 1 MMOL/ML IV SOLN COMPARISON:  CT abdomen pelvis, 05/18/2020  FINDINGS: Lower chest: No acute findings. Hepatobiliary: No mass or other parenchymal abnormality identified. Multiple small gallstones and/or polyps in the gallbladder. There is mild enlargement of the common bile duct, measuring up to 8 mm, and there is a possible, although not definite small gallstone at the ampulla measuring approximately 3 mm (series 15, image 19). Pancreas: There is inflammatory fat stranding centered about the pancreatic head and adjacent retroperitoneum. There is no evidence of parenchymal necrosis or acute pancreatic fluid collection. No pancreatic ductal dilatation Spleen:  Within normal limits in size and appearance. Adrenals/Urinary Tract: No masses identified. Nonenhancing exophytic cyst of the anterior midportion of the left kidney. No evidence of hydronephrosis. Stomach/Bowel: Visualized portions within the abdomen are unremarkable. Vascular/Lymphatic: No pathologically enlarged lymph nodes identified. No abdominal aortic aneurysm demonstrated. Other:  None. Musculoskeletal: No suspicious bone lesions identified. IMPRESSION: 1. Inflammatory fat stranding centered about the pancreatic head and adjacent retroperitoneum, consistent with acute pancreatitis. No evidence of parenchymal necrosis or acute pancreatic fluid collection. 2. There is a possible gallstone no larger than 3 mm at the ampulla. Mild enlargement of the common bile duct, measuring up to 8 mm. 3. Additional small gallstones and/or polyps in the gallbladder. Electronically Signed   By: Eddie Candle M.D.   On: 05/18/2020 08:30   DG ERCP  Result Date: 05/20/2020 CLINICAL DATA:  Evaluate for bile duct injury. Failed intraoperative cholangiogram. EXAM: ERCP TECHNIQUE: Multiple spot images obtained with the fluoroscopic device and submitted for interpretation post-procedure. COMPARISON:  ERCP-05/18/2020; MRCP-05/18/2020; CT abdomen pelvis-05/18/2020 FLUOROSCOPY TIME:  2 minutes, 17 seconds FINDINGS: Three spot intraoperative  fluoroscopic images the right upper abdominal quadrant during ERCP are provided for review Initial image demonstrates an ERCP probe overlying the right upper abdominal quadrant. Cholecystectomy clips and a large bore surgical drainage catheter overlie the expected location of gallbladder fossa. There is selective cannulation and opacification of the CBD with insufflation of a balloon within distal aspect of the  CBD. There is apparent extravasation of contrast at the level of the central aspect of the CBD with apparent communication to the level of the large bore surgical drainage catheter and without definitive opacification of the intrahepatic biliary tree. IMPRESSION: 1. Post cholecystectomy. 2. ERCP appears to demonstrate an injury of the central aspect of the CBD with extravasation of contrast communicating with a large bore surgical drainage catheter and without definitive opacification of the intrahepatic biliary tree. Correlation with the operative report is advised. These images were submitted for radiologic interpretation only. Please see the procedural report for the amount of contrast and the fluoroscopy time utilized. Electronically Signed   By: Sandi Mariscal M.D.   On: 05/20/2020 14:16   DG ERCP  Result Date: 05/18/2020 CLINICAL DATA:  ERCP. EXAM: ERCP TECHNIQUE: Multiple spot images obtained with the fluoroscopic device and submitted for interpretation post-procedure. COMPARISON:  MRCP-05/18/2020 FINDINGS: Multiple fluoroscopic images the right upper abdominal quadrant during ERCP are provided for review. Initial images demonstrate selective cannulation opacification of the common bile duct which appears moderately dilated. There is a nonocclusive lentiform filling defect within the distal aspect of the CBD potentially indicative choledocholithiasis. Subsequent images demonstrate insufflation of a balloon within the CBD with subsequent biliary sweeping and presumed sphincterotomy. IMPRESSION: ERCP  with biliary sweeping and presumed sphincterotomy. These images were submitted for radiologic interpretation only. Please see the procedural report for the amount of contrast and the fluoroscopy time utilized. Electronically Signed   By: Sandi Mariscal M.D.   On: 05/18/2020 14:23   MR ABDOMEN MRCP W WO CONTAST  Result Date: 05/18/2020 CLINICAL DATA:  Severe pancreatitis, right upper quadrant pain, nausea, vomiting, choledocholithiasis suspected on prior CT EXAM: MRI ABDOMEN WITHOUT AND WITH CONTRAST (INCLUDING MRCP) TECHNIQUE: Multiplanar multisequence MR imaging of the abdomen was performed both before and after the administration of intravenous contrast. Heavily T2-weighted images of the biliary and pancreatic ducts were obtained, and three-dimensional MRCP images were rendered by post processing. CONTRAST:  30mL GADAVIST GADOBUTROL 1 MMOL/ML IV SOLN COMPARISON:  CT abdomen pelvis, 05/18/2020 FINDINGS: Lower chest: No acute findings. Hepatobiliary: No mass or other parenchymal abnormality identified. Multiple small gallstones and/or polyps in the gallbladder. There is mild enlargement of the common bile duct, measuring up to 8 mm, and there is a possible, although not definite small gallstone at the ampulla measuring approximately 3 mm (series 15, image 19). Pancreas: There is inflammatory fat stranding centered about the pancreatic head and adjacent retroperitoneum. There is no evidence of parenchymal necrosis or acute pancreatic fluid collection. No pancreatic ductal dilatation Spleen:  Within normal limits in size and appearance. Adrenals/Urinary Tract: No masses identified. Nonenhancing exophytic cyst of the anterior midportion of the left kidney. No evidence of hydronephrosis. Stomach/Bowel: Visualized portions within the abdomen are unremarkable. Vascular/Lymphatic: No pathologically enlarged lymph nodes identified. No abdominal aortic aneurysm demonstrated. Other:  None. Musculoskeletal: No suspicious bone  lesions identified. IMPRESSION: 1. Inflammatory fat stranding centered about the pancreatic head and adjacent retroperitoneum, consistent with acute pancreatitis. No evidence of parenchymal necrosis or acute pancreatic fluid collection. 2. There is a possible gallstone no larger than 3 mm at the ampulla. Mild enlargement of the common bile duct, measuring up to 8 mm. 3. Additional small gallstones and/or polyps in the gallbladder. Electronically Signed   By: Eddie Candle M.D.   On: 05/18/2020 08:30     Subjective: - no chest pain, shortness of breath, no abdominal pain, nausea or vomiting.   Discharge Exam: BP 129/69 (  BP Location: Left Arm)   Pulse (!) 103   Temp 98.1 F (36.7 C) (Oral)   Resp 18   Ht 5\' 8"  (1.727 m)   Wt 68.5 kg   SpO2 95%   BMI 22.96 kg/m   General: Pt is alert, awake, not in acute distress Cardiovascular: RRR, S1/S2 +, no rubs, no gallops Respiratory: CTA bilaterally, no wheezing, no rhonchi Abdominal: Soft, NT, ND, bowel sounds + Extremities: no edema, no cyanosis   The results of significant diagnostics from this hospitalization (including imaging, microbiology, ancillary and laboratory) are listed below for reference.     Microbiology: Recent Results (from the past 240 hour(s))  SARS Coronavirus 2 by RT PCR (hospital order, performed in [facility] hospital lab) Nasopharyngeal Nasopharyngeal Swab     Status: None   Collection Time: 05/18/20  4:56 AM   Specimen: Nasopharyngeal Swab  Result Value Ref Range Status   SARS Coronavirus 2 NEGATIVE NEGATIVE Final    Comment: (NOTE) SARS-CoV-2 target nucleic acids are NOT DETECTED.  The SARS-CoV-2 RNA is generally detectable in upper and lower respiratory specimens during the acute phase of infection. The lowest concentration of SARS-CoV-2 viral copies this assay can detect is 250 copies / mL. A negative result does not preclude SARS-CoV-2 infection and should not be used as the sole basis for treatment or  other patient management decisions.  A negative result may occur with improper specimen collection / handling, submission of specimen other than nasopharyngeal swab, presence of viral mutation(s) within the areas targeted by this assay, and inadequate number of viral copies (<250 copies / mL). A negative result must be combined with clinical observations, patient history, and epidemiological information.  Fact Sheet for Patients:   StrictlyIdeas.no  Fact Sheet for Healthcare Providers: BankingDealers.co.za  This test is not yet approved or  cleared by the [place] FDA and has been authorized for detection and/or diagnosis of SARS-CoV-2 by FDA under an Emergency Use Authorization (EUA).  This EUA will remain in effect (meaning this test can be used) for the duration of the COVID-19 declaration under Section 564(b)(1) of the Act, 21 U.S.C. section 360bbb-3(b)(1), unless the authorization is terminated or revoked sooner.  Performed at [facility], [address], [place], [place] [address]   Surgical pcr screen     Status: None   Collection Time: 05/19/20  1:14 PM   Specimen: Nasal Mucosa; Nasal Swab  Result Value Ref Range Status   MRSA, PCR NEGATIVE NEGATIVE Final   Staphylococcus aureus NEGATIVE NEGATIVE Final    Comment: (NOTE) The Xpert SA Assay (FDA approved for NASAL specimens in patients 22 years of age and older), is one component of a comprehensive surveillance program. It is not intended to diagnose infection nor to guide or monitor treatment. Performed at [facility], [address] [place], [place] [address]      Labs: Basic Metabolic Panel: Recent Labs  Lab 05/18/20 0227 05/19/20 0647 05/20/20 0708  NA 136 138 139  K 4.4 3.7 3.6  CL 98 105 106  CO2 27 23 25   GLUCOSE 174* 118* 90  BUN 18 16 13   CREATININE 0.77 0.67 0.62  CALCIUM 9.7 8.2* 8.4*   Liver Function Tests: Recent Labs  Lab 05/18/20 0227  05/19/20 0647 05/20/20 0708  AST 373* 187* 79*  ALT 273* 266* 181*  ALKPHOS 157* 217* 178*  BILITOT 2.5* 3.1* 1.7*  PROT 7.4 5.8* 5.8*  ALBUMIN 4.4 3.3* 3.5   CBC: Recent Labs  Lab  05/18/20 0227 05/19/20 0647 05/20/20 0708  WBC 10.6* 8.2 8.1  NEUTROABS  --  6.8 5.2  HGB 12.7* 11.5* 11.2*  HCT 38.2* 35.3* 34.6*  MCV 91.4 90.5 92.0  PLT 159 134* 142*   CBG: Recent Labs  Lab 05/20/20 1331  GLUCAP 177*   Hgb A1c No results for input(s): HGBA1C in the last 72 hours. Lipid Profile No results for input(s): CHOL, HDL, LDLCALC, TRIG, CHOLHDL, LDLDIRECT in the last 72 hours. Thyroid function studies No results for input(s): TSH, T4TOTAL, T3FREE, THYROIDAB in the last 72 hours.  Invalid input(s): FREET3 Urinalysis    Component Value Date/Time   COLORURINE YELLOW 05/18/2020 0111   APPEARANCEUR HAZY (A) 05/18/2020 0111   LABSPEC 1.018 05/18/2020 0111   PHURINE 6.0 05/18/2020 0111   GLUCOSEU NEGATIVE 05/18/2020 0111   HGBUR NEGATIVE 05/18/2020 0111   BILIRUBINUR NEGATIVE 05/18/2020 0111   KETONESUR NEGATIVE 05/18/2020 0111   PROTEINUR NEGATIVE 05/18/2020 0111   UROBILINOGEN 0.2 09/27/2012 1445   NITRITE NEGATIVE 05/18/2020 0111   LEUKOCYTESUR NEGATIVE 05/18/2020 0111    FURTHER DISCHARGE INSTRUCTIONS:   Get Medicines reviewed and adjusted: Please take all your medications with you for your next visit with your Primary MD   Laboratory/radiological data: Please request your Primary MD to go over all hospital tests and procedure/radiological results at the follow up, please ask your Primary MD to get all Hospital records sent to his/her office.   In some cases, they will be blood work, cultures and biopsy results pending at the time of your discharge. Please request that your primary care M.D. goes through all the records of your hospital data and follows up on these results.   Also Note the following: If you experience worsening of your admission symptoms, develop  shortness of breath, life threatening emergency, suicidal or homicidal thoughts you must seek medical attention immediately by calling 911 or calling your MD immediately  if symptoms less severe.   You must read complete instructions/literature along with all the possible adverse reactions/side effects for all the Medicines you take and that have been prescribed to you. Take any new Medicines after you have completely understood and accpet all the possible adverse reactions/side effects.    Do not drive when taking Pain medications or sleeping medications (Benzodaizepines)   Do not take more than prescribed Pain, Sleep and Anxiety Medications. It is not advisable to combine anxiety,sleep and pain medications without talking with your primary care practitioner   Special Instructions: If you have smoked or chewed Tobacco  in the last 2 yrs please stop smoking, stop any regular Alcohol  and or any Recreational drug use.   Wear Seat belts while driving.   Please note: You were cared for by a hospitalist during your hospital stay. Once you are discharged, your primary care physician will handle any further medical issues. Please note that NO REFILLS for any discharge medications will be authorized once you are discharged, as it is imperative that you return to your primary care physician (or establish a relationship with a primary care physician if you do not have one) for your post hospital discharge needs so that they can reassess your need for medications and monitor your lab values.  Time coordinating discharge: 40 minutes  SIGNED:  Marzetta Board, MD, PhD 05/20/2020, 2:54 PM

## 2020-05-20 NOTE — Progress Notes (Signed)
Report called to Janett Billow RN at [facility] to be transferred to [facility] room 909

## 2020-05-20 NOTE — Progress Notes (Signed)
[facility]  Notified daughter, Orbie Hurst about concerns regarding anatomy of the gallbladder and the biilary system. Dr. Laural Golden coming in for repeat  ERCP now.  Curlene Labrum, MD [facility] [address] [place] [address] [place], [place] [address] [phone] (office)

## 2020-05-20 NOTE — Progress Notes (Signed)
Randy Thomas, M.D. Gastroenterology & Hepatology   Interval History: No acute events overnight. Patient tolerated diet adequately yesterday.  Denies having any complaints today.  At the present any more episodes of abdominal pain, nausea, vomiting, fever, chills, abdominal distention.  No change in his bowel movements. Liver enzymes today were noted to improve compared to yesterday. Patient is scheduled to undergo cholecystectomy today.  Inpatient Medications:  Current Facility-Administered Medications:  .  0.9 %  sodium chloride infusion, , Intravenous, Continuous, Thomas, Randy U, MD, Last Rate: 100 mL/hr at 05/19/20 1408, New Bag at 05/19/20 1408 .  ALPRAZolam (XANAX) tablet 0.25 mg, 0.25 mg, Oral, Once PRN, Randy Thomas, Costin M, MD .  bupivacaine (MARCAINE) 0.5 % injection, , , PRN, Randy Cagey, MD, 20 mL at 05/20/20 0914 .  [MAR Hold] cefTRIAXone (ROCEPHIN) 2 g in sodium chloride 0.9 % 100 mL IVPB, 2 g, Intravenous, Q24H, Thomas, Randy U, MD, Last Rate: 200 mL/hr at 05/19/20 1140, 2 g at 05/19/20 1140 .  hemostatic agents, , , PRN, Randy Cagey, MD, 1 application at 56/43/32 0914 .  [MAR Hold] HYDROmorphone (DILAUDID) injection 1 mg, 1 mg, Intravenous, Q4H PRN, Thomas, Randy U, MD, 1 mg at 05/18/20 0530 .  [MAR Hold] metoprolol tartrate (LOPRESSOR) injection 5 mg, 5 mg, Intravenous, Q8H, Thomas, Randy U, MD, 5 mg at 05/20/20 0540 .  [MAR Hold] ondansetron (ZOFRAN) tablet 4 mg, 4 mg, Oral, Q6H PRN **OR** [MAR Hold] ondansetron (ZOFRAN) injection 4 mg, 4 mg, Intravenous, Q6H PRN, Thomas, Randy Dawley, MD .  Doug Sou Hold] pantoprazole (PROTONIX) injection 40 mg, 40 mg, Intravenous, Q24H, Thomas, Randy U, MD, 40 mg at 05/19/20 1005 .  sodium chloride irrigation 0.9 %, , , PRN, Randy Cagey, MD, 1,000 mL at 05/20/20 0825  Facility-Administered Medications Ordered in Other Encounters:  .  dexamethasone (DECADRON) injection, , Intravenous, Anesthesia Intra-op, Battula,  Rajamani C, MD, 5 mg at 05/20/20 0910 .  fentaNYL (SUBLIMAZE) injection, , Intravenous, Anesthesia Intra-op, Battula, Rajamani C, MD, 50 mcg at 05/20/20 0908 .  lactated ringers infusion, , Intravenous, Continuous PRN, Denese Killings, MD, New Bag at 05/20/20 [phone] .  lidocaine (cardiac) 100 mg/70m (XYLOCAINE) injection 2%, , Tracheal Tube, Anesthesia Intra-op, Battula, Rajamani C, MD, 100 mg at 05/20/20 0838 .  midazolam (VERSED) 5 MG/5ML injection, , Intravenous, Anesthesia Intra-op, Battula, Rajamani C, MD, 1 mg at 05/20/20 0827 .  propofol (DIPRIVAN) 10 mg/mL bolus/IV push, , Intravenous, Anesthesia Intra-op, Battula, Rajamani C, MD, 100 mg at 05/20/20 0838 .  rocuronium bromide 10 mg/mL (PF) syringe, , Intravenous, Anesthesia Intra-op, Battula, Rajamani C, MD, 40 mg at 05/20/20 0838   I/O    Intake/Output Summary (Last 24 hours) at 05/20/2020 0933 Last data filed at 05/20/2020 0848 Gross per 24 hour  Intake 460 ml  Output --  Net 460 ml     Physical Exam: Temp:  [97.9 F (36.6 C)-98.9 F (37.2 C)] 98.2 F (36.8 C) (09/05 0537) Pulse Rate:  [62-85] 65 (09/05 0537) Resp:  [16-20] 16 (09/05 0537) BP: (121-145)/(53-62) 145/62 (09/05 0537) SpO2:  [94 %-98 %] 96 % (09/05 0537)  Temp (24hrs), Avg:98.3 F (36.8 C), Min:97.9 F (36.6 C), Max:98.9 F (37.2 C) GENERAL: The patient is AO x3, in no acute distress. Elder. HEENT: Head is normocephalic and atraumatic. EOMI are intact. Mouth is well hydrated and without lesions. NECK: Supple. No masses LUNGS: Clear to auscultation. No presence of rhonchi/wheezing/rales. Adequate chest expansion HEART: RRR, normal s1 and s2. ABDOMEN:  Soft, nontender, no guarding, no peritoneal signs, and nondistended. BS +. No masses. EXTREMITIES: Without any cyanosis, clubbing, rash, lesions or edema. NEUROLOGIC: AOx3, no focal motor deficit. SKIN: no jaundice, no rashes  Laboratory Data: CBC:     Component Value Date/Time   WBC 8.1 05/20/2020 0708    RBC 3.76 (L) 05/20/2020 0708   HGB 11.2 (L) 05/20/2020 0708   HCT 34.6 (L) 05/20/2020 0708   PLT 142 (L) 05/20/2020 0708   MCV 92.0 05/20/2020 0708   MCH 29.8 05/20/2020 0708   MCHC 32.4 05/20/2020 0708   RDW 14.2 05/20/2020 0708   LYMPHSABS 1.6 05/20/2020 0708   MONOABS 0.9 05/20/2020 0708   EOSABS 0.2 05/20/2020 0708   BASOSABS 0.0 05/20/2020 0708   COAG: No results found for: INR, PROTIME  BMP:  BMP Latest Ref Rng & Units 05/20/2020 05/19/2020 05/18/2020  Glucose 70 - 99 mg/dL 90 118(H) 174(H)  BUN 8 - 23 mg/dL 13 16 18   Creatinine 0.61 - 1.24 mg/dL 0.62 0.67 0.77  BUN/Creat Ratio 6 - 22 (calc) - - -  Sodium 135 - 145 mmol/L 139 138 136  Potassium 3.5 - 5.1 mmol/L 3.6 3.7 4.4  Chloride 98 - 111 mmol/L 106 105 98  CO2 22 - 32 mmol/L 25 23 27   Calcium 8.9 - 10.3 mg/dL 8.4(L) 8.2(L) 9.7    HEPATIC:  Hepatic Function Latest Ref Rng & Units 05/20/2020 05/19/2020 05/18/2020  Total Protein 6.5 - 8.1 g/dL 5.8(L) 5.8(L) 7.4  Albumin 3.5 - 5.0 g/dL 3.5 3.3(L) 4.4  AST 15 - 41 U/L 79(H) 187(H) 373(H)  ALT 0 - 44 U/L 181(H) 266(H) 273(H)  Alk Phosphatase 38 - 126 U/L 178(H) 217(H) 157(H)  Total Bilirubin 0.3 - 1.2 mg/dL 1.7(H) 3.1(H) 2.5(H)    CARDIAC: No results found for: CKTOTAL, CKMB, CKMBINDEX, TROPONINI    Imaging: I personally reviewed and interpreted the available labs, imaging and endoscopic files.   Assessment/Plan: 84 year old male with past medical history of cholelithiasis, GERD, hyperlipidemia, hypertension, myocardial infarction status post 2 stents, who was admitted to the hospital after presenting an episode of acute pancreatitis.  Etiology for pancreatitis is due to gallstones.  Due to this, patient underwent ERCP yesterday after being found to have a filling defect in MRCP.  Successful sphincterotomy was performed with drainage of bile and identification of filling defect in the distal CBD.  Patient has presented resolution of his symptoms without need of pain medication.     Liver enzymes have improved after the patient underwent ERCP.  He will undergo cholecystectomy today. We will need to trend liver enzymes on a daily basis.   # Gallstone pancreatitis # Choledocholitiasis -Keep n.p.o. for cholecystectomy today -Daily liver enzymes -Pain control per primary team -Surgery service to proceed with cholecystectomy as scheduled  Randy Peppers, MD Gastroenterology and Hepatology [facility]  Note: Occasional unusual wording and randomly placed punctuation marks may result from the use of speech recognition technology to transcribe this document

## 2020-05-20 NOTE — Interval H&P Note (Signed)
History and Physical Interval Note:  05/20/2020 8:25 AM  Randy Thomas  has presented today for surgery, with the diagnosis of gallstone pancreatitis, choledocoliathiasis.  The various methods of treatment have been discussed with the patient and family. After consideration of risks, benefits and other options for treatment, the patient has consented to  Procedure(s): LAPAROSCOPIC CHOLECYSTECTOMY (N/A) as a surgical intervention.  The patient's history has been reviewed, patient examined, no change in status, stable for surgery.  I have reviewed the patient's chart and labs.  Questions were answered to the patient's satisfaction.    No changes. No questions. S/p ERCP. Feeling better tolerated clears yesterday.  [name]

## 2020-05-20 NOTE — Transfer of Care (Signed)
Immediate Anesthesia Transfer of Care Note  Patient: Randy Thomas  Procedure(s) Performed: LAPAROSCOPIC CHOLECYSTECTOMY (N/A )  Patient Location: PACU  Anesthesia Type:General  Level of Consciousness: awake, alert , oriented and sedated  Airway & Oxygen Therapy: Patient Spontanous Breathing and Patient connected to nasal cannula oxygen  Post-op Assessment: Report given to RN and Post -op Vital signs reviewed and stable  Post vital signs: Reviewed and stable  Last Vitals:  Vitals Value Taken Time  BP 130/63 05/20/20 1330  Temp 36.9 C 05/20/20 1330  Pulse 75 05/20/20 1337  Resp 11 05/20/20 1337  SpO2 95 % 05/20/20 1337  Vitals shown include unvalidated device data.  Last Pain:  Vitals:   05/20/20 1333  TempSrc:   PainSc: 3       Patients Stated Pain Goal: 6 (59/45/85 9292)  Complications: No complications documented.

## 2020-05-20 NOTE — Op Note (Signed)
[facility] Patient Name: Randy Thomas Procedure Date: 05/20/2020 12:50 PM MRN: [id number] Date of Birth: [birth year] Attending MD: Hildred Laser , MD CSN: [id number] Age: 84 Admit Type: Inpatient Procedure:                ERCP; urgent exam following immediately following                            Lap. Cholecystectomy. Indications:              Postop exam follwing laparoscopic cholecystectomy;                            bile duct injury suspected Providers:                Hildred Laser, MD, Lurline Del, RN, Nelma Rothman,                            Technician Referring MD:              Medicines:                General Anesthesia Complications:            No immediate complications. Estimated Blood Loss:     Estimated blood loss: none. Procedure:                Pre-Anesthesia Assessment:                           - Prior to the procedure, a History and Physical                            was performed, and patient medications and                            allergies were reviewed. The patient's tolerance of                            previous anesthesia was also reviewed. The risks                            and benefits of the procedure and the sedation                            options and risks were discussed with the patient.                            All questions were answered, and informed consent                            was obtained. Prior Anticoagulants: The patient has                            taken no previous anticoagulant or antiplatelet                            agents except for aspirin. ASA  Grade Assessment:                            III - A patient with severe systemic disease. After                            reviewing the risks and benefits, the patient was                            deemed in satisfactory condition to undergo the                            procedure.                           After obtaining informed consent, the scope was                             passed under direct vision to duodenum. Throughout                            the procedure, the patient's blood pressure, pulse,                            and oxygen saturations were monitored continuously.                            The [id number]-[id number] ([id number]) scope was introduced                            through the mouth, and used to inject contrast into                            and used to cannulate the bile duct. The ERCP was                            accomplished without difficulty. The patient                            tolerated the procedure well. Scope In: 1:00:26 PM Scope Out: 1:11:31 PM Total Procedure Duration: 0 hours 11 minutes 5 seconds  Findings:      A scout film of the abdomen was obtained. Surgical clips, consistent       with a previous cholecystectomy, were seen in the area of the right       upper quadrant of the abdomen. The esophagus was successfully intubated       under direct vision. The scope was advanced to a normal major papilla in       the descending duodenum without detailed examination of the pharynx,       larynx and associated structures, and upper GI tract. The upper GI tract       was grossly normal. The major papilla was congested. eveidence of       biliary sphincterotomy, The bile duct was deeply cannulated with the       Hydratome sphincterotome.  Contrast was injected. Occlusion cholangiogram       was obtained. I personally interpreted the bile duct images. Ductal flow       of contrast was adequate. Image quality was excellent. Contrast extended       to the main bile duct. The upper third of the main bile duct was       completely obstructed by clips from previous cholecystectomy.      The esophagus was successfully intubated under direct vision. The scope       was advanced to a normal major papilla in the descending duodenum       without detailed examination of the pharynx, larynx and associated       structures, and upper GI  tract. The upper GI tract was grossly normal.       The esophagus was successfully intubated under direct vision. The scope       was advanced to a normal major papilla in the descending duodenum       without detailed examination of the pharynx, larynx and associated       structures, and upper GI tract. The upper GI tract was grossly normal. Impression:               - The major papilla appeared congested secodart to                            recent biliary sphincyrtotomy.                           - A biliary tract obstruction secondary to clips                            from previous cholecystectomy was found in the                            upper third of the main duct. Moderate Sedation:      Per Anesthesia Care Recommendation:           - Return patient to hospital ward for ongoing care.                           - Continue present medications.                           - NPO.                           - Transfer to tertiary center for                            chelodocho-jejunostomy.                           Comment: Dr. Constance Haw and Dr. Arnoldo Morale were present                            in the OR during ERCP.                           Dr,. Constance Haw and  I met with patient's daughter                            Orbie Hurst and also connected with another daughter and                            wife via conference call. Procedure Code(s):        --- Professional ---                           [phone], Endoscopic retrograde                            cholangiopancreatography (ERCP); diagnostic,                            including collection of specimen(s) by brushing or                            washing, when performed (separate procedure) Diagnosis Code(s):        --- Professional ---                           K91.89, Other postprocedural complications and                            disorders of digestive system                           K83.1, Obstruction of bile duct                            Z09, Encounter for follow-up examination after                            completed treatment for conditions other than                            malignant neoplasm                           Z90.49, Acquired absence of other specified parts                            of digestive tract                           K83.8, Other specified diseases of biliary tract CPT copyright 2019 American Medical Association. All rights reserved. The codes documented in this report are preliminary and upon coder review may  be revised to meet current compliance requirements. Hildred Laser, MD Hildred Laser, MD 05/20/2020 2:22:03 PM This report has been signed electronically. Number of Addenda: 0

## 2020-05-20 NOTE — Anesthesia Preprocedure Evaluation (Addendum)
Anesthesia Evaluation  Patient identified by MRN, date of birth, ID band Patient awake    Reviewed: Allergy & Precautions, NPO status , Patient's Chart, lab work & pertinent test results, reviewed documented beta blocker date and time   History of Anesthesia Complications Negative for: history of anesthetic complications  Airway Mallampati: II  TM Distance: >3 FB Neck ROM: Full    Dental  (+) Edentulous Lower, Edentulous Upper   Pulmonary former smoker,    Pulmonary exam normal breath sounds clear to auscultation       Cardiovascular Exercise Tolerance: Good hypertension, Pt. on medications and Pt. on home beta blockers + CAD, + Past MI and + Cardiac Stents  Normal cardiovascular exam Rhythm:Regular Rate:Normal  18-May-2020 02:26:02 [facility]-ER ROUTINE RECORD Sinus rhythm Inferior infarct, old No significant change was found Confirmed by Ezequiel Essex [phone]) on 05/18/2020 2:41:05 AM   Neuro/Psych negative neurological ROS  negative psych ROS   GI/Hepatic GERD  Medicated,(+) Hepatitis -CBD stone and obstruction   Endo/Other  negative endocrine ROS  Renal/GU negative Renal ROS  negative genitourinary   Musculoskeletal negative musculoskeletal ROS (+)   Abdominal   Peds  Hematology negative hematology ROS (+) anemia ,   Anesthesia Other Findings   Reproductive/Obstetrics negative OB ROS                             Anesthesia Physical  Anesthesia Plan  ASA: III  Anesthesia Plan: General   Post-op Pain Management:    Induction: Intravenous  PONV Risk Score and Plan: 3 and Ondansetron and Dexamethasone  Airway Management Planned: Oral ETT  Additional Equipment:   Intra-op Plan:   Post-operative Plan: Extubation in OR  Informed Consent: I have reviewed the patients History and Physical, chart, labs and discussed the procedure including the risks,  benefits and alternatives for the proposed anesthesia with the patient or authorized representative who has indicated his/her understanding and acceptance.       Plan Discussed with: CRNA and Surgeon  Anesthesia Plan Comments:         Anesthesia Quick Evaluation

## 2020-05-20 NOTE — Progress Notes (Addendum)
[facility]  Called and spoke with Dr. Natividad Brood at [facility] who is covering for Surgical Oncology/ Hepatobiliary.  I discussed the complication of common bile duct transection with clips and portion of the hepatic duct removed in the specimen. Unseen hepatic ducts in the hepatic bed/ plate.  Jp drain placed.  I have made surgery [facility] knows my cell phone number, so that they can get in touch with me.  I have talked with Orbie Hurst, daughter, and Sonia Side, daughter regarding the complication of the bile duct transection. I have talked to them about transfer to [facility] and that the hepatobiliary surgeons will determine best plan for bile drainage and reconstruction. I also gave Orbie Hurst my number so she can get in touch with me about any questions or concerns.   I am getting CD made with intraoperative ERCP from today 9/5, ERCP from 9/3 and MRCP and CT from 9/3.   Curlene Labrum, MD [facility] [address] [place], [place] [address] [phone] (office)

## 2020-05-20 NOTE — Anesthesia Postprocedure Evaluation (Signed)
Anesthesia Post Note  Patient: Randy Thomas  Procedure(s) Performed: LAPAROSCOPIC CHOLECYSTECTOMY (N/A )  Patient location during evaluation: PACU Anesthesia Type: General Level of consciousness: awake and alert, oriented and sedated Pain management: pain level controlled Vital Signs Assessment: post-procedure vital signs reviewed and stable Respiratory status: spontaneous breathing, respiratory function stable and patient connected to nasal cannula oxygen Cardiovascular status: blood pressure returned to baseline Postop Assessment: no apparent nausea or vomiting Anesthetic complications: no   No complications documented.   Last Vitals:  Vitals:   05/20/20 1327 05/20/20 1330  BP: 126/60   Pulse: 82   Resp: 14   Temp:  36.9 C  SpO2: 94%     Last Pain:  Vitals:   05/20/20 1344  TempSrc:   PainSc: Asleep                 Dayna Alia C Silvester Reierson

## 2020-05-22 ENCOUNTER — Telehealth (INDEPENDENT_AMBULATORY_CARE_PROVIDER_SITE_OTHER): Payer: Medicare HMO | Admitting: General Surgery

## 2020-05-22 ENCOUNTER — Other Ambulatory Visit: Payer: Self-pay | Admitting: Family Medicine

## 2020-05-22 ENCOUNTER — Other Ambulatory Visit: Payer: Self-pay | Admitting: *Deleted

## 2020-05-22 ENCOUNTER — Encounter (HOSPITAL_COMMUNITY): Payer: Self-pay | Admitting: Internal Medicine

## 2020-05-22 DIAGNOSIS — K529 Noninfective gastroenteritis and colitis, unspecified: Secondary | ICD-10-CM

## 2020-05-22 DIAGNOSIS — R634 Abnormal weight loss: Secondary | ICD-10-CM

## 2020-05-22 DIAGNOSIS — I2510 Atherosclerotic heart disease of native coronary artery without angina pectoris: Secondary | ICD-10-CM

## 2020-05-22 DIAGNOSIS — I10 Essential (primary) hypertension: Secondary | ICD-10-CM

## 2020-05-22 DIAGNOSIS — E785 Hyperlipidemia, unspecified: Secondary | ICD-10-CM

## 2020-05-22 DIAGNOSIS — K8020 Calculus of gallbladder without cholecystitis without obstruction: Secondary | ICD-10-CM

## 2020-05-22 DIAGNOSIS — A09 Infectious gastroenteritis and colitis, unspecified: Secondary | ICD-10-CM

## 2020-05-22 NOTE — Telephone Encounter (Signed)
[facility]  Reached out to family to check on patient. Randy Thomas said father was in pain after surgery at [facility].  Curlene Labrum, MD [facility] [address] [place], [place] [address] [phone] (office)

## 2020-05-23 LAB — SURGICAL PATHOLOGY

## 2020-05-24 ENCOUNTER — Ambulatory Visit: Payer: Medicare HMO

## 2020-05-30 DIAGNOSIS — Z48815 Encounter for surgical aftercare following surgery on the digestive system: Secondary | ICD-10-CM | POA: Diagnosis not present

## 2020-06-06 DIAGNOSIS — S3613XD Injury of bile duct, subsequent encounter: Secondary | ICD-10-CM | POA: Diagnosis not present

## 2020-06-11 DIAGNOSIS — S3613XD Injury of bile duct, subsequent encounter: Secondary | ICD-10-CM | POA: Diagnosis not present

## 2020-06-11 DIAGNOSIS — Z48815 Encounter for surgical aftercare following surgery on the digestive system: Secondary | ICD-10-CM | POA: Diagnosis not present

## 2020-06-14 ENCOUNTER — Other Ambulatory Visit: Payer: Self-pay | Admitting: Family Medicine

## 2020-06-14 MED ORDER — HYDROCODONE-ACETAMINOPHEN 5-325 MG PO TABS
1.0000 | ORAL_TABLET | Freq: Four times a day (QID) | ORAL | 0 refills | Status: DC | PRN
Start: 2020-06-14 — End: 2021-01-02

## 2020-06-14 NOTE — Telephone Encounter (Signed)
MD please advise

## 2020-06-14 NOTE — Telephone Encounter (Signed)
Patients wife called in requesting a refill on his hydrocodone, she states that the patient has had 3 surgeries and he is in a lot of pain could we call in for more than 3 days. He uses [facility] on [address]. In [place].   CB# [phone]

## 2020-08-27 DIAGNOSIS — Z9049 Acquired absence of other specified parts of digestive tract: Secondary | ICD-10-CM | POA: Diagnosis not present

## 2020-08-27 DIAGNOSIS — Z9889 Other specified postprocedural states: Secondary | ICD-10-CM | POA: Diagnosis not present

## 2020-08-27 DIAGNOSIS — S3613XD Injury of bile duct, subsequent encounter: Secondary | ICD-10-CM | POA: Diagnosis not present

## 2020-11-20 IMAGING — CT CT ABD-PELV W/O CM
2 of 4 series · 16 of 46 positions shown, 18 images · non-contrast
Comparison: 08/22/2019

CLINICAL DATA: Chronic diarrhea, unintentional weight loss of 15
lbs over 3 months. Concern for gastroenteritis or colitis.

EXAM:
CT ABDOMEN AND PELVIS WITHOUT CONTRAST
TECHNIQUE: Multidetector CT imaging of the abdomen and pelvis was performed
following the standard protocol without IV contrast.

[Series 2: axial st · axial · 0.71mm/px · z∈[+930,+1355]mm · 13 of 97 slices shown, 15 images]
[im 6/97  soft-tissue]
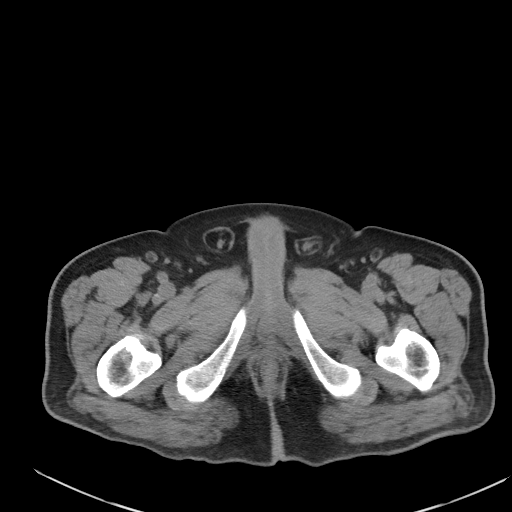
[im 6/97  bone]
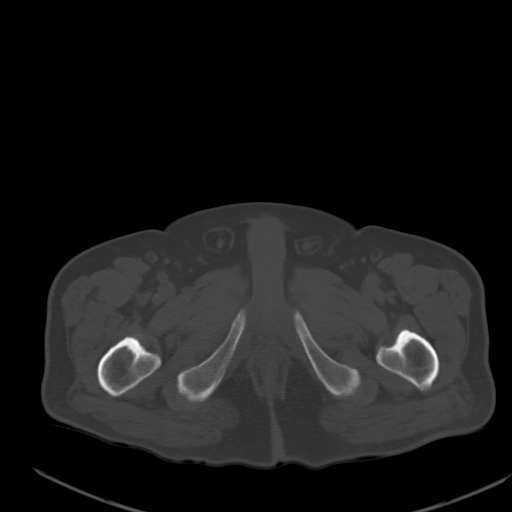
[im 12/97  soft-tissue]
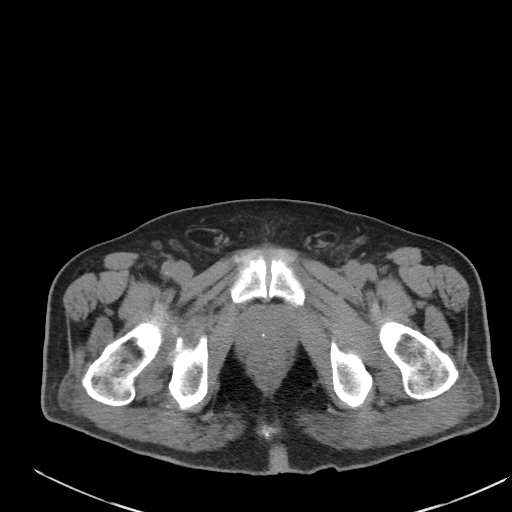
[im 23/97  soft-tissue]
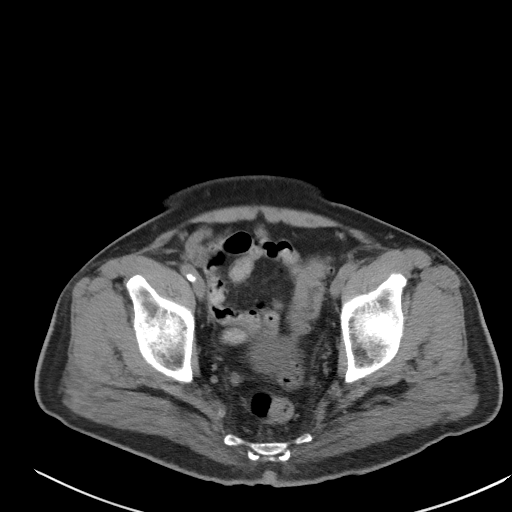
[im 29/97  soft-tissue]
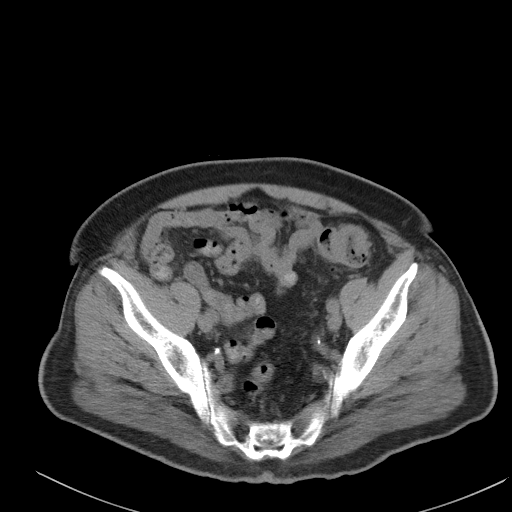
[im 34/97  soft-tissue]
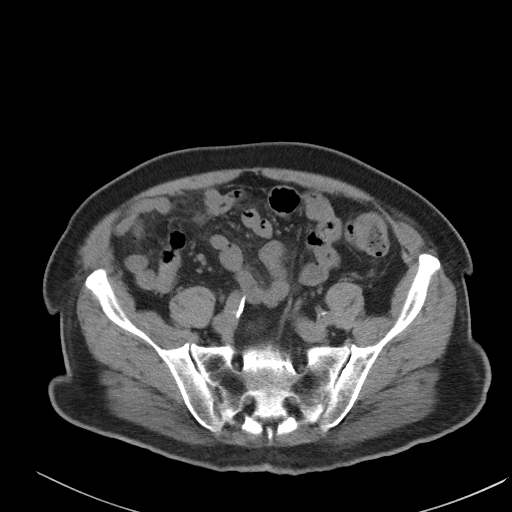
[im 40/97  soft-tissue]
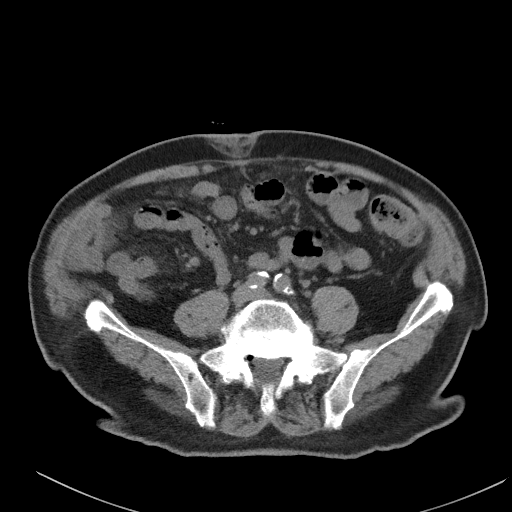
[im 51/97  soft-tissue]
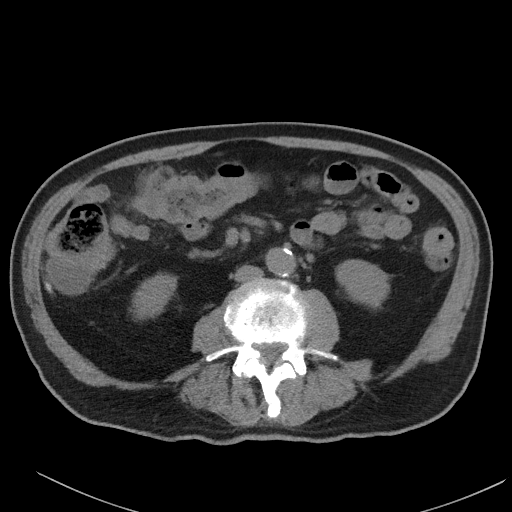
[im 57/97  soft-tissue]
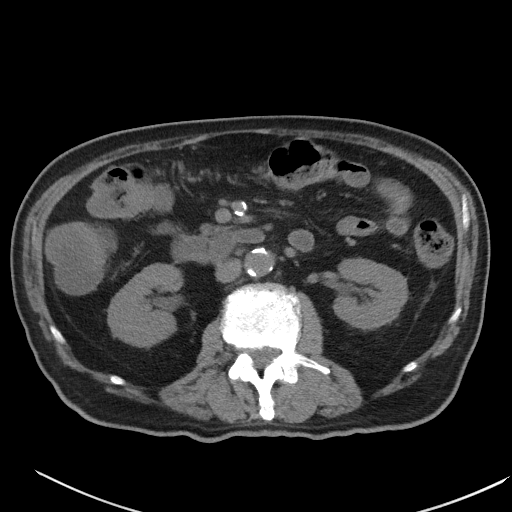
[im 63/97  soft-tissue]
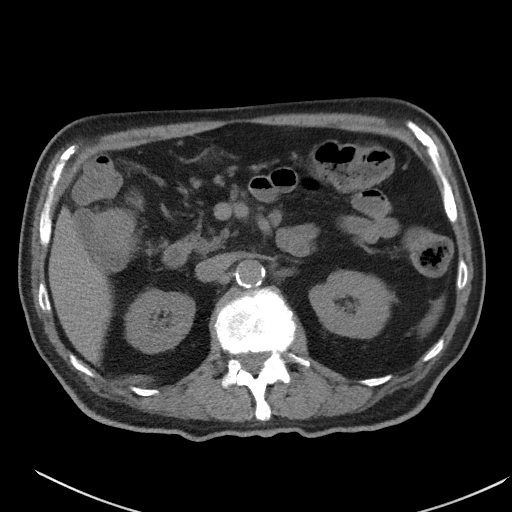
[im 63/97  bone]
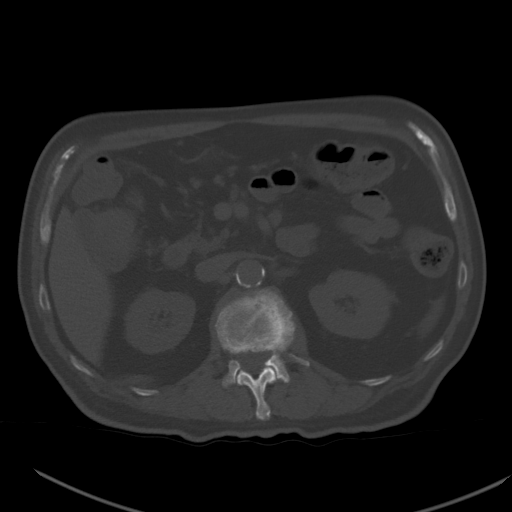
[im 68/97  soft-tissue]
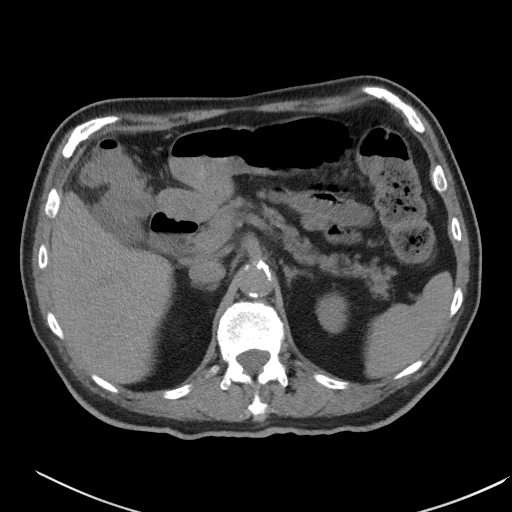
[im 74/97  soft-tissue]
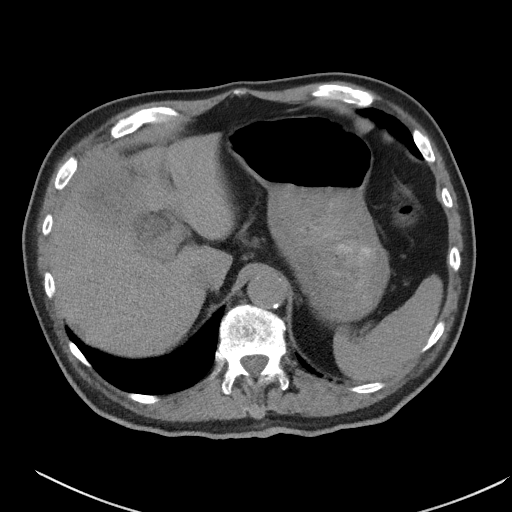
[im 85/97  soft-tissue]
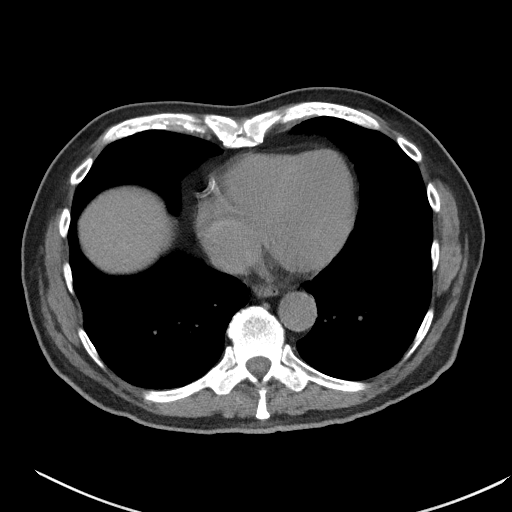
[im 91/97  soft-tissue]
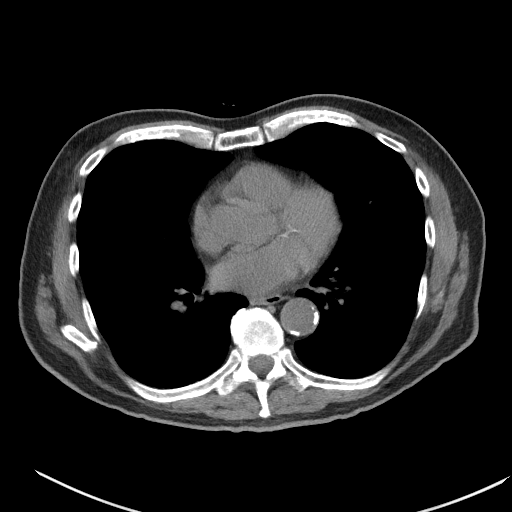

[Series 5: coronal st · coronal · 0.82mm/px · 3 of 112 slices shown]
[im 38/112  soft-tissue]
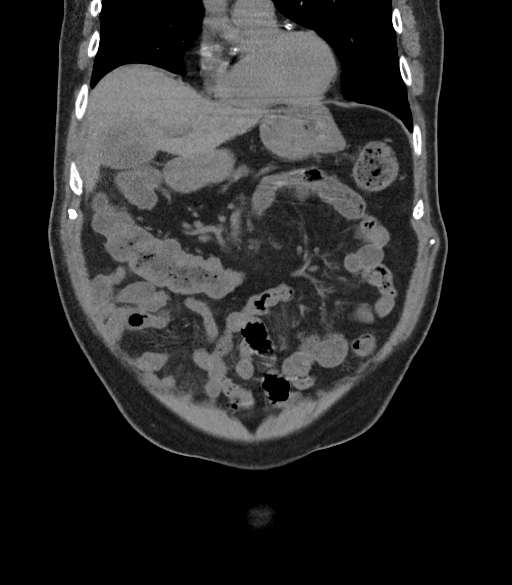
[im 50/112  soft-tissue]
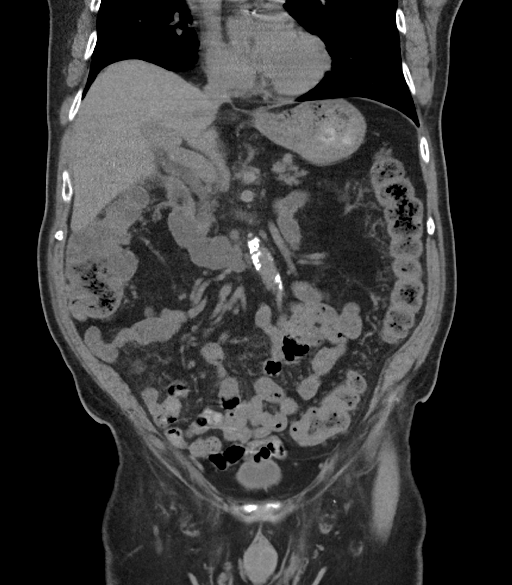
[im 62/112  soft-tissue]
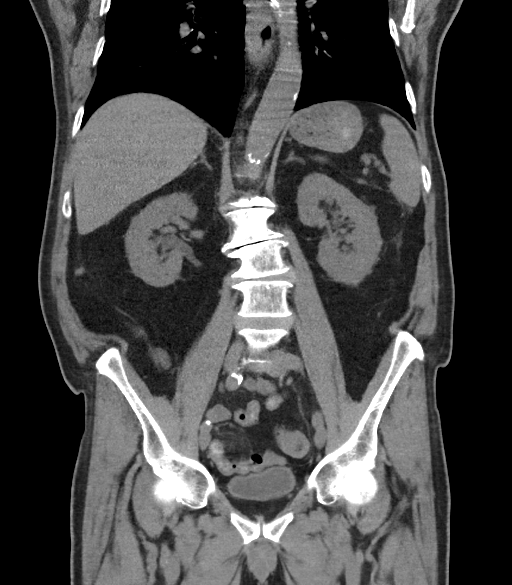

[16 of 46 positions shown; findings below may reference images not displayed]

FINDINGS: Lower chest: Coronary and aortic atherosclerotic calcification.

Hepatobiliary: Gallstone in the gallbladder neck. Abnormal wall
thickening of the gallbladder. Abnormal hypodensity in the liver
along the gallbladder fossa, probably related to local inflammation.
I am skeptical of a malignancy in this location given the relatively
normal appearance of the liver in this area on 08/22/2019.

Pancreas: Unremarkable

Spleen: Unremarkable

Adrenals/Urinary Tract: 1.2 by 1.1 cm hyperdense cyst of the left
mid kidney noted anteriorly on image 38/2, similar to the prior
exam. Urinary bladder unremarkable.

Stomach/Bowel: Sigmoid colon diverticulosis. No dilated bowel
identified.

Vascular/Lymphatic: Aortoiliac atherosclerotic vascular disease.

Reproductive: Mild prostatomegaly, stable.

Other: Stable umbilical hernia with adjacent stranding in the non
herniated omentum which is slightly increased from prior.

Musculoskeletal: Lumbar spondylosis and degenerative disc disease
causing left foraminal impingement at L1-2, L2-3, and L5-S1; and
right foraminal impingement at L5-S1.
IMPRESSION: 1. Abnormal wall thickening in the gallbladder with a gallstone in
the gallbladder neck. There is also abnormal hypodensity in the
liver along the gallbladder fossa, probably related to local
inflammation. The appearance suggests acute or chronic
cholecystitis.
2. Small umbilical hernia containing adipose tissue, with stranding
both in the herniated adipose tissue and in the adjacent omentum
suggesting inflammation. The amount of omental stranding in this
vicinity is mildly increased.
3. Other imaging findings of potential clinical significance:
Coronary and aortic atherosclerotic vascular disease. Sigmoid colon
diverticulosis. Mild prostatomegaly. Lumbar spondylosis and
degenerative disc disease causing multilevel impingement. Stable
hyperdense cyst of the left mid kidney anteriorly.

Aortic Atherosclerosis ([id number].O).

## 2020-12-21 ENCOUNTER — Other Ambulatory Visit: Payer: Self-pay | Admitting: Family Medicine

## 2020-12-21 NOTE — Telephone Encounter (Signed)
Ok to refill??  Last office visit 11/14/2019.  Last refill 06/14/2020.  Of note, letter sent to patient to schedule OV.

## 2020-12-21 NOTE — Telephone Encounter (Signed)
Patient called to request refill of   HYDROcodone-acetaminophen (NORCO/VICODIN) 5-325 MG tablet [[id number]]   Pharmacy:  [facility], [address]. [place], [place] [address]  Phone:  [phone] Fax:  [phone]  DEA #:  [id number]  Please advise patient when Rx called in at [phone].

## 2021-01-02 ENCOUNTER — Encounter: Payer: Self-pay | Admitting: Family Medicine

## 2021-01-02 ENCOUNTER — Other Ambulatory Visit: Payer: Self-pay

## 2021-01-02 ENCOUNTER — Ambulatory Visit (INDEPENDENT_AMBULATORY_CARE_PROVIDER_SITE_OTHER): Payer: Medicare HMO | Admitting: Family Medicine

## 2021-01-02 VITALS — BP 96/58 | HR 80 | Temp 98.1°F | Resp 16 | Ht 68.0 in | Wt 145.0 lb

## 2021-01-02 DIAGNOSIS — I10 Essential (primary) hypertension: Secondary | ICD-10-CM | POA: Diagnosis not present

## 2021-01-02 DIAGNOSIS — I2510 Atherosclerotic heart disease of native coronary artery without angina pectoris: Secondary | ICD-10-CM | POA: Diagnosis not present

## 2021-01-02 MED ORDER — HYDROCODONE-ACETAMINOPHEN 5-325 MG PO TABS: 1.0000 | ORAL_TABLET | Freq: Four times a day (QID) | ORAL | 0 refills | Status: DC | PRN

## 2021-01-02 NOTE — Progress Notes (Signed)
Subjective:    Patient ID: Randy Thomas, male    DOB: 1935/12/26, 85 y.o.   MRN: [id number]  HPI  I last saw the patient in March of last year.  He is here today for follow-up.  He has a history of coronary artery disease status post 2 coronary stents.  He is on a beta-blocker as well as a low-dose ACE inhibitor.  He denies any chest pain shortness of breath or dyspnea on exertion.  He is not on a statin however last year he was suffering from cholelithiasis with elevated liver function test.  He had a difficult surgical course and lost substantial weight.  Today he presents with low blood pressure.  He denies any orthostatic hypotension.  He denies any syncope or presyncope.  He denies any angina.  He is taking aspirin.  He denies any melena or hematochezia.  He believes that he is eating and drinking enough.  Past Medical History:  Diagnosis Date  . Allergy   . GERD (gastroesophageal reflux disease)   . Hard of hearing   . Hypercholesteremia   . Hyperlipidemia   . Hypertension   . Myocardial infarction ([place])    X 2 stents   Past Surgical History:  Procedure Laterality Date  . CARDIAC CATHETERIZATION     X 2 stents  . CHOLECYSTECTOMY N/A 05/20/2020   Procedure: LAPAROSCOPIC CHOLECYSTECTOMY;  Surgeon: [name], MD;  Location: [facility];  Service: General;  Laterality: N/A;  . ERCP N/A 05/18/2020   Procedure: ENDOSCOPIC RETROGRADE CHOLANGIOPANCREATOGRAPHY (ERCP) ;  Surgeon: Rogene Houston, MD;  Location: [facility];  Service: Endoscopy;  Laterality: N/A;  . ERCP N/A 05/20/2020   Procedure: ENDOSCOPIC RETROGRADE CHOLANGIOPANCREATOGRAPHY (ERCP);  Surgeon: [name], MD;  Location: [facility];  Service: General;  Laterality: N/A;  . [place] wore cast  . SPHINCTEROTOMY  05/18/2020   Procedure: SPHINCTEROTOMY mild;  Surgeon: Rogene Houston, MD;  Location: [facility];  Service: Endoscopy;;  . TONSILLECTOMY     as a child   Current Outpatient Medications  on File Prior to Visit  Medication Sig Dispense Refill  . Ascorbic Acid (VITAMIN C) 1000 MG tablet Take 1,000 mg by mouth daily.    Marland Kitchen aspirin EC 81 MG tablet Take 1 tablet (81 mg total) by mouth daily. 30 tablet 3  . cetirizine (ZYRTEC) 10 MG tablet TAKE 1 TABLET(10 MG) BY MOUTH DAILY 30 tablet 11  . cholecalciferol (VITAMIN D) 1000 units tablet Take 1 tablet (1,000 Units total) by mouth daily. 30 tablet 3  . methocarbamol (ROBAXIN) 750 MG tablet Take 1 tablet (750 mg total) by mouth 4 (four) times daily. (Patient taking differently: Take 750 mg by mouth 4 (four) times daily. As needed) 60 tablet 2  . metoprolol succinate (TOPROL-XL) 50 MG 24 hr tablet TAKE 1/2 TABLET BY MOUTH IN THE MORNING AND 1/2 TABLET BY MOUTH IN THE EVENING 60 tablet 4  . Multiple Vitamin (MULTIVITAMIN WITH MINERALS) TABS tablet Take 1 tablet by mouth daily. 30 tablet 3  . nitroGLYCERIN (NITROSTAT) 0.4 MG SL tablet DISSOLVE ONE TABLET UNDER THE TONGUE AS DIRECTED BY YOUR MEDICAL PROVIDER FOR CHEST PAIN (REPEAT EVERY 5 MINUTES IF NEEDED FOR A TOTAL OF 3 TABLETS IN 15 MINUTES. IF NO RELIEF, CALL 911.)    . pantoprazole (PROTONIX) 40 MG tablet Take 40 mg by mouth daily.    . ramipril (ALTACE) 2.5 MG capsule Take 1 capsule by  mouth daily.    Marland Kitchen VASCEPA 1 g capsule Take 1 capsule by mouth in the morning and at bedtime.    . vitamin B-12 (CYANOCOBALAMIN) 50 MCG tablet Take 50 mcg by mouth daily.     No current facility-administered medications on file prior to visit.   No Known Allergies  Social History   Socioeconomic History  . Marital status: Married    Spouse name: Not on file  . Number of children: Not on file  . Years of education: Not on file  . Highest education level: Not on file  Occupational History  . Occupation: retired    Comment: truck Geophysicist/field seismologist  Tobacco Use  . Smoking status: Former Smoker    Quit date: 09/15/1981    Years since quitting: 39.3  . Smokeless tobacco: Never Used  Vaping Use  . Vaping Use:  Never used  Substance and Sexual Activity  . Alcohol use: No    Alcohol/week: 0.0 standard drinks  . Drug use: No  . Sexual activity: Not Currently  Other Topics Concern  . Not on file  Social History Narrative  . Not on file   Social Determinants of Health   Financial Resource Strain: Not on file  Food Insecurity: Not on file  Transportation Needs: Not on file  Physical Activity: Not on file  Stress: Not on file  Social Connections: Not on file  Intimate Partner Violence: Not on file    Review of Systems  All other systems reviewed and are negative.      Objective:   Physical Exam Vitals reviewed.  Constitutional:      Appearance: Normal appearance. He is normal weight. He is not ill-appearing or diaphoretic.  HENT:     Mouth/Throat:     Mouth: Mucous membranes are moist.  Cardiovascular:     Rate and Rhythm: Normal rate and regular rhythm.     Heart sounds: Normal heart sounds. No murmur heard. No friction rub. No gallop.   Pulmonary:     Effort: Pulmonary effort is normal. No respiratory distress.     Breath sounds: Normal breath sounds. No stridor. No wheezing, rhonchi or rales.  Abdominal:     General: Abdomen is flat. Bowel sounds are normal. There is no distension.     Palpations: Abdomen is soft.     Tenderness: There is no abdominal tenderness. There is no guarding or rebound.  Musculoskeletal:     Right lower leg: No edema.     Left lower leg: No edema.  Neurological:     Mental Status: He is alert.           Assessment & Plan:  Coronary artery disease involving native coronary artery of native heart without angina pectoris - Plan: CBC with Differential/Platelet, COMPLETE METABOLIC PANEL WITH GFR  Essential hypertension  Given his substantial weight loss over the last year, I believe he is taking too much medication.  I recommended that he discontinue his evening dose of metoprolol and continue to take a half a tablet in the morning along with  the ramipril.  I will check a CBC to rule out anemia.  I will check a CMP to evaluate for any renal failure or dehydration.  Recheck blood pressure in 1 week.

## 2021-01-03 ENCOUNTER — Other Ambulatory Visit: Payer: Self-pay | Admitting: *Deleted

## 2021-01-03 LAB — COMPLETE METABOLIC PANEL WITH GFR
AG Ratio: 1.4 (calc) (ref 1.0–2.5)
ALT: 90 U/L — ABNORMAL HIGH (ref 9–46)
AST: 124 U/L — ABNORMAL HIGH (ref 10–35)
Albumin: 4 g/dL (ref 3.6–5.1)
Alkaline phosphatase (APISO): 674 U/L — ABNORMAL HIGH (ref 35–144)
BUN/Creatinine Ratio: 22 (calc) (ref 6–22)
BUN: 15 mg/dL (ref 7–25)
CO2: 26 mmol/L (ref 20–32)
Calcium: 9 mg/dL (ref 8.6–10.3)
Chloride: 103 mmol/L (ref 98–110)
Creat: 0.67 mg/dL — ABNORMAL LOW (ref 0.70–1.11)
GFR, Est African American: 104 mL/min/{1.73_m2} (ref >=60)
GFR, Est Non African American: 89 mL/min/{1.73_m2} (ref >=60)
Globulin: 2.8 g/dL (calc) (ref 1.9–3.7)
Glucose, Bld: 85 mg/dL (ref 65–99)
Potassium: 4.8 mmol/L (ref 3.5–5.3)
Sodium: 139 mmol/L (ref 135–146)
Total Bilirubin: 1 mg/dL (ref 0.2–1.2)
Total Protein: 6.8 g/dL (ref 6.1–8.1)

## 2021-01-03 LAB — CBC WITH DIFFERENTIAL/PLATELET
Absolute Monocytes: 779 cells/uL (ref 200–950)
Basophils Absolute: 71 cells/uL (ref 0–200)
Basophils Relative: 1.2 %
Eosinophils Absolute: 89 cells/uL (ref 15–500)
Eosinophils Relative: 1.5 %
HCT: 37.4 % — ABNORMAL LOW (ref 38.5–50.0)
Hemoglobin: 12.1 g/dL — ABNORMAL LOW (ref 13.2–17.1)
Lymphs Abs: 1310 cells/uL (ref 850–3900)
MCH: 28.2 pg (ref 27.0–33.0)
MCHC: 32.4 g/dL (ref 32.0–36.0)
MCV: 87.2 fL (ref 80.0–100.0)
MPV: 12 fL (ref 7.5–12.5)
Monocytes Relative: 13.2 %
Neutro Abs: 3652 cells/uL (ref 1500–7800)
Neutrophils Relative %: 61.9 %
Platelets: 192 10*3/uL (ref 140–400)
RBC: 4.29 10*6/uL (ref 4.20–5.80)
RDW: 13.8 % (ref 11.0–15.0)
Total Lymphocyte: 22.2 %
WBC: 5.9 10*3/uL (ref 3.8–10.8)

## 2021-01-22 ENCOUNTER — Telehealth: Payer: Self-pay | Admitting: Family Medicine

## 2021-01-22 NOTE — Telephone Encounter (Signed)
Copied from [place] [id number]. Topic: Medicare AWV >> Jan 22, 2021 12:22 PM Cher Nakai R wrote: Reason for CRM:  Left message for patient to call back and schedule Medicare Annual Wellness Visit (AWV) in office.   If not able to come in office, please offer to do virtually or by telephone.   Last AWV: 02/10/2018  Please schedule at anytime with [facility]-Nurse Health Advisor.  If any questions, please contact me at [phone]

## 2021-05-24 ENCOUNTER — Telehealth: Payer: Self-pay

## 2021-05-24 NOTE — Telephone Encounter (Signed)
Attempted to reach patient in regards to scheduling Medicare annual wellness visit. Left message to return call to office to schedule. Kathrene Alu RN

## 2021-06-06 DIAGNOSIS — S3613XD Injury of bile duct, subsequent encounter: Secondary | ICD-10-CM | POA: Diagnosis not present

## 2021-07-22 ENCOUNTER — Other Ambulatory Visit: Payer: Self-pay | Admitting: Family Medicine

## 2021-07-22 NOTE — Telephone Encounter (Signed)
Patient's spouse Benjamine Mola left a voicemail to request refill of HYDROcodone-acetaminophen (NORCO/VICODIN) 5-325 MG tablet [[id number]]   Please advise at [phone].

## 2021-07-22 NOTE — Telephone Encounter (Signed)
Ok to refill??  Last office visit/ refill 01/02/2021.

## 2021-07-23 MED ORDER — HYDROCODONE-ACETAMINOPHEN 5-325 MG PO TABS: 1.0000 | ORAL_TABLET | Freq: Four times a day (QID) | ORAL | 0 refills | Status: DC | PRN

## 2021-07-31 ENCOUNTER — Telehealth: Payer: Self-pay | Admitting: Family Medicine

## 2021-07-31 NOTE — Telephone Encounter (Signed)
Left message for patient to call back and schedule Medicare Annual Wellness Visit (AWV) in office.   If not able to come in office, please offer to do virtually or by telephone.  Left office number and my jabber [phone].  Last AWV:02/10/2018  Please schedule at anytime with Nurse Health Advisor.

## 2021-08-01 ENCOUNTER — Telehealth: Payer: Self-pay | Admitting: Family Medicine

## 2021-08-01 NOTE — Telephone Encounter (Signed)
Left message for patient to call back and schedule Medicare Annual Wellness Visit (AWV) in office.   If not able to come in office, please offer to do virtually or by telephone.  Left office number and my jabber [phone].  Last AWV:02/10/2018  Please schedule at anytime with Nurse Health Advisor.

## 2021-08-29 ENCOUNTER — Other Ambulatory Visit: Payer: Self-pay

## 2021-08-29 DIAGNOSIS — E785 Hyperlipidemia, unspecified: Secondary | ICD-10-CM

## 2021-08-29 DIAGNOSIS — I10 Essential (primary) hypertension: Secondary | ICD-10-CM

## 2021-09-05 ENCOUNTER — Other Ambulatory Visit: Payer: Medicare HMO

## 2021-09-05 ENCOUNTER — Other Ambulatory Visit: Payer: Self-pay

## 2021-09-05 DIAGNOSIS — E785 Hyperlipidemia, unspecified: Secondary | ICD-10-CM | POA: Diagnosis not present

## 2021-09-05 DIAGNOSIS — I10 Essential (primary) hypertension: Secondary | ICD-10-CM | POA: Diagnosis not present

## 2021-09-06 LAB — LIPID PANEL
Cholesterol: 124 mg/dL (ref <200)
HDL: 43 mg/dL (ref >=40)
LDL Cholesterol (Calc): 57 mg/dL (calc)
Non-HDL Cholesterol (Calc): 81 mg/dL (calc) (ref <130)
Total CHOL/HDL Ratio: 2.9 (calc) (ref <5.0)
Triglycerides: 166 mg/dL — ABNORMAL HIGH (ref <150)

## 2021-09-06 LAB — CBC WITH DIFFERENTIAL/PLATELET
Absolute Monocytes: 768 cells/uL (ref 200–950)
Basophils Absolute: 60 cells/uL (ref 0–200)
Basophils Relative: 1 %
Eosinophils Absolute: 150 cells/uL (ref 15–500)
Eosinophils Relative: 2.5 %
HCT: 36.3 % — ABNORMAL LOW (ref 38.5–50.0)
Hemoglobin: 11.5 g/dL — ABNORMAL LOW (ref 13.2–17.1)
Lymphs Abs: 1182 cells/uL (ref 850–3900)
MCH: 27.8 pg (ref 27.0–33.0)
MCHC: 31.7 g/dL — ABNORMAL LOW (ref 32.0–36.0)
MCV: 87.7 fL (ref 80.0–100.0)
MPV: 12.5 fL (ref 7.5–12.5)
Monocytes Relative: 12.8 %
Neutro Abs: 3840 cells/uL (ref 1500–7800)
Neutrophils Relative %: 64 %
Platelets: 152 10*3/uL (ref 140–400)
RBC: 4.14 10*6/uL — ABNORMAL LOW (ref 4.20–5.80)
RDW: 13.3 % (ref 11.0–15.0)
Total Lymphocyte: 19.7 %
WBC: 6 10*3/uL (ref 3.8–10.8)

## 2021-09-06 LAB — COMPREHENSIVE METABOLIC PANEL
AG Ratio: 1.2 (calc) (ref 1.0–2.5)
ALT: 76 U/L — ABNORMAL HIGH (ref 9–46)
AST: 126 U/L — ABNORMAL HIGH (ref 10–35)
Albumin: 3.9 g/dL (ref 3.6–5.1)
Alkaline phosphatase (APISO): 547 U/L — ABNORMAL HIGH (ref 35–144)
BUN/Creatinine Ratio: 27 (calc) — ABNORMAL HIGH (ref 6–22)
BUN: 13 mg/dL (ref 7–25)
CO2: 28 mmol/L (ref 20–32)
Calcium: 9 mg/dL (ref 8.6–10.3)
Chloride: 102 mmol/L (ref 98–110)
Creat: 0.48 mg/dL — ABNORMAL LOW (ref 0.70–1.22)
Globulin: 3.2 g/dL (calc) (ref 1.9–3.7)
Glucose, Bld: 111 mg/dL — ABNORMAL HIGH (ref 65–99)
Potassium: 4.2 mmol/L (ref 3.5–5.3)
Sodium: 140 mmol/L (ref 135–146)
Total Bilirubin: 0.9 mg/dL (ref 0.2–1.2)
Total Protein: 7.1 g/dL (ref 6.1–8.1)

## 2021-09-12 ENCOUNTER — Other Ambulatory Visit: Payer: Self-pay

## 2021-09-12 ENCOUNTER — Ambulatory Visit (INDEPENDENT_AMBULATORY_CARE_PROVIDER_SITE_OTHER): Payer: Medicare HMO | Admitting: Family Medicine

## 2021-09-12 VITALS — BP 138/62 | HR 85 | Ht 68.0 in | Wt 144.0 lb

## 2021-09-12 DIAGNOSIS — I10 Essential (primary) hypertension: Secondary | ICD-10-CM | POA: Diagnosis not present

## 2021-09-12 DIAGNOSIS — Z0000 Encounter for general adult medical examination without abnormal findings: Secondary | ICD-10-CM

## 2021-09-12 DIAGNOSIS — H903 Sensorineural hearing loss, bilateral: Secondary | ICD-10-CM | POA: Insufficient documentation

## 2021-09-12 DIAGNOSIS — S3613XS Injury of bile duct, sequela: Secondary | ICD-10-CM

## 2021-09-12 DIAGNOSIS — K429 Umbilical hernia without obstruction or gangrene: Secondary | ICD-10-CM | POA: Insufficient documentation

## 2021-09-12 DIAGNOSIS — E785 Hyperlipidemia, unspecified: Secondary | ICD-10-CM

## 2021-09-12 DIAGNOSIS — Z0001 Encounter for general adult medical examination with abnormal findings: Secondary | ICD-10-CM

## 2021-09-12 DIAGNOSIS — I2510 Atherosclerotic heart disease of native coronary artery without angina pectoris: Secondary | ICD-10-CM | POA: Diagnosis not present

## 2021-09-12 NOTE — Progress Notes (Signed)
Subjective:    Patient ID: Randy Thomas, male    DOB: [birth year], 85 y.o.   MRN: [id number]  HPI  Patient is an 85 year old Caucasian gentleman here today for complete physical exam.  He has a complicated past medical history including "[place] artery disease that is post percutaneous stenting x2.  He also has a history of a laparoscopic cholecystectomy.  However he suffered an injury to the bile duct during the surgery in 2021 and was transferred to [facility] for bile duct reconstruction.  This was performed by Dr. Carlis Abbott at [facility].  He recently saw Dr. Carlis Abbott in September.  Labs showed an elevated bilirubin level of 1.7, and mild elevations in AST and ALT.  Per Dr. Ainsley Spinner note, they wanted repeat lab test in 1 week and if persistently elevated obtain an MRI to evaluate for obstruction in the biliary tract secondary to scar tissue.  However, something occurred and is never happened.  The patient recently had labs pertaining to his physical which showed elevated liver function test.  However the trend was down.  Bilirubin was 0.9.  AST was 126.  ALT was 76.  In September at [facility] this was 130s/90s.  Therefore the labs are stable or even slightly better.  The patient is completely asymptomatic.  He denies any abdominal pain, nausea, vomiting, change in his bowel habits, acholic stool, weight loss, etc.  Overall he feels fine.  He is due for a flu shot which he refuses.  Based on his age he does not require prostate cancer screening or colon cancer screening. Appointment on 09/05/2021  Component Date Value Ref Range Status   Glucose, Bld 09/05/2021 111 (H)  65 - 99 mg/dL Final   Comment: .            Fasting reference interval . For someone without known diabetes, a glucose value between 100 and 125 mg/dL is consistent with prediabetes and should be confirmed with a follow-up test. .    BUN 09/05/2021 13  7 - 25 mg/dL Final   Creat 09/05/2021 0.48 (L)  0.70 - 1.22 mg/dL Final    BUN/Creatinine Ratio 09/05/2021 27 (H)  6 - 22 (calc) Final   Sodium 09/05/2021 140  135 - 146 mmol/L Final   Potassium 09/05/2021 4.2  3.5 - 5.3 mmol/L Final   Chloride 09/05/2021 102  98 - 110 mmol/L Final   CO2 09/05/2021 28  20 - 32 mmol/L Final   Calcium 09/05/2021 9.0  8.6 - 10.3 mg/dL Final   Total Protein 09/05/2021 7.1  6.1 - 8.1 g/dL Final   Albumin 09/05/2021 3.9  3.6 - 5.1 g/dL Final   Globulin 09/05/2021 3.2  1.9 - 3.7 g/dL (calc) Final   AG Ratio 09/05/2021 1.2  1.0 - 2.5 (calc) Final   Total Bilirubin 09/05/2021 0.9  0.2 - 1.2 mg/dL Final   Alkaline phosphatase (APISO) 09/05/2021 547 (H)  35 - 144 U/L Final   AST 09/05/2021 126 (H)  10 - 35 U/L Final   ALT 09/05/2021 76 (H)  9 - 46 U/L Final   WBC 09/05/2021 6.0  3.8 - 10.8 Thousand/uL Final   RBC 09/05/2021 4.14 (L)  4.20 - 5.80 Million/uL Final   Hemoglobin 09/05/2021 11.5 (L)  13.2 - 17.1 g/dL Final   HCT 09/05/2021 36.3 (L)  38.5 - 50.0 % Final   MCV 09/05/2021 87.7  80.0 - 100.0 fL Final   MCH 09/05/2021 27.8  27.0 - 33.0 pg Final  MCHC 09/05/2021 31.7 (L)  32.0 - 36.0 g/dL Final   RDW 09/05/2021 13.3  11.0 - 15.0 % Final   Platelets 09/05/2021 152  140 - 400 Thousand/uL Final   MPV 09/05/2021 12.5  7.5 - 12.5 fL Final   Neutro Abs 09/05/2021 3,840  1,500 - 7,800 cells/uL Final   Lymphs Abs 09/05/2021 1,182  850 - 3,900 cells/uL Final   Absolute Monocytes 09/05/2021 768  200 - 950 cells/uL Final   Eosinophils Absolute 09/05/2021 150  15 - 500 cells/uL Final   Basophils Absolute 09/05/2021 60  0 - 200 cells/uL Final   Neutrophils Relative % 09/05/2021 64  % Final   Total Lymphocyte 09/05/2021 19.7  % Final   Monocytes Relative 09/05/2021 12.8  % Final   Eosinophils Relative 09/05/2021 2.5  % Final   Basophils Relative 09/05/2021 1.0  % Final   Cholesterol 09/05/2021 124  <200 mg/dL Final   HDL 09/05/2021 43  > OR = 40 mg/dL Final   Triglycerides 09/05/2021 166 (H)  <150 mg/dL Final   LDL Cholesterol (Calc)  09/05/2021 57  mg/dL (calc) Final   Comment: Reference range: <100 . Desirable range <100 mg/dL for primary prevention;   <70 mg/dL for patients with CHD or diabetic patients  with > or = 2 CHD risk factors. Marland Kitchen LDL-C is now calculated using the Martin-Hopkins  calculation, which is a validated novel method providing  better accuracy than the Friedewald equation in the  estimation of LDL-C.  Cresenciano Genre et al. Annamaria Helling. 9735;329(92): 2061-2068  (http://education.[employer].com/faq/FAQ164)    Total CHOL/HDL Ratio 09/05/2021 2.9  <5.0 (calc) Final   Non-HDL Cholesterol (Calc) 09/05/2021 81  <130 mg/dL (calc) Final   Comment: For patients with diabetes plus 1 major ASCVD risk  factor, treating to a non-HDL-C goal of <100 mg/dL  (LDL-C of <70 mg/dL) is considered a therapeutic  option.     Past Medical History:  Diagnosis Date   Allergy    GERD (gastroesophageal reflux disease)    Hard of hearing    Hypercholesteremia    Hyperlipidemia    Hypertension    Myocardial infarction ([place])    X 2 stents   Past Surgical History:  Procedure Laterality Date   CARDIAC CATHETERIZATION     X 2 stents   CHOLECYSTECTOMY N/A 05/20/2020   Procedure: LAPAROSCOPIC CHOLECYSTECTOMY;  Surgeon: [name], MD;  Location: [facility];  Service: General;  Laterality: N/A;   ERCP N/A 05/18/2020   Procedure: ENDOSCOPIC RETROGRADE CHOLANGIOPANCREATOGRAPHY (ERCP) ;  Surgeon: Rogene Houston, MD;  [facility];  Service: Endoscopy;  Laterality: N/A;   ERCP N/A 05/20/2020   Procedure: ENDOSCOPIC RETROGRADE CHOLANGIOPANCREATOGRAPHY (ERCP);  Surgeon: [name], MD;  Location: [facility];  Service: General;  Laterality: N/A;   [place] wore cast   SPHINCTEROTOMY  05/18/2020   Procedure: SPHINCTEROTOMY mild;  Surgeon: Rogene Houston, MD;  [facility];  Service: Endoscopy;;   TONSILLECTOMY     as a child   Current Outpatient Medications on File Prior to Visit   Medication Sig Dispense Refill   Ascorbic Acid (VITAMIN C) 1000 MG tablet Take 1,000 mg by mouth daily.     aspirin EC 81 MG tablet Take 1 tablet (81 mg total) by mouth daily. 30 tablet 3   cetirizine (ZYRTEC) 10 MG tablet TAKE 1 TABLET(10 MG) BY MOUTH DAILY 30 tablet 11   cholecalciferol (VITAMIN D) 1000 units tablet Take  1 tablet (1,000 Units total) by mouth daily. 30 tablet 3   HYDROcodone-acetaminophen (NORCO/VICODIN) 5-325 MG tablet Take 1 tablet by mouth every 6 (six) hours as needed. 15 tablet 0   methocarbamol (ROBAXIN) 750 MG tablet Take 1 tablet (750 mg total) by mouth 4 (four) times daily. (Patient taking differently: Take 750 mg by mouth 4 (four) times daily. As needed) 60 tablet 2   metoprolol succinate (TOPROL-XL) 50 MG 24 hr tablet TAKE 1/2 TABLET BY MOUTH IN THE MORNING AND 1/2 TABLET BY MOUTH IN THE EVENING (Patient taking differently: TAKE 1/2 TABLET BY MOUTH IN THE MORNING) 60 tablet 4   Multiple Vitamin (MULTIVITAMIN WITH MINERALS) TABS tablet Take 1 tablet by mouth daily. 30 tablet 3   nitroGLYCERIN (NITROSTAT) 0.4 MG SL tablet DISSOLVE ONE TABLET UNDER THE TONGUE AS DIRECTED BY YOUR MEDICAL PROVIDER FOR CHEST PAIN (REPEAT EVERY 5 MINUTES IF NEEDED FOR A TOTAL OF 3 TABLETS IN 15 MINUTES. IF NO RELIEF, CALL 911.)     pantoprazole (PROTONIX) 40 MG tablet Take 40 mg by mouth daily.     ramipril (ALTACE) 2.5 MG capsule Take 1 capsule by mouth daily.     VASCEPA 1 g capsule Take 1 capsule by mouth in the morning and at bedtime.     vitamin B-12 (CYANOCOBALAMIN) 50 MCG tablet Take 50 mcg by mouth daily.     No current facility-administered medications on file prior to visit.   No Known Allergies  Social History   Socioeconomic History   Marital status: Married    Spouse name: Not on file   Number of children: Not on file   Years of education: Not on file   Highest education level: Not on file  Occupational History   Occupation: retired    Comment: truck driver  Tobacco  Use   Smoking status: Former    Types: Cigarettes    Quit date: 09/15/1981    Years since quitting: 40.0   Smokeless tobacco: Never  Vaping Use   Vaping Use: Never used  Substance and Sexual Activity   Alcohol use: No    Alcohol/week: 0.0 standard drinks   Drug use: No   Sexual activity: Not Currently  Other Topics Concern   Not on file  Social History Narrative   Not on file   Social Determinants of Health   Financial Resource Strain: Not on file  Food Insecurity: Not on file  Transportation Needs: Not on file  Physical Activity: Not on file  Stress: Not on file  Social Connections: Not on file  Intimate Partner Violence: Not on file    Review of Systems  All other systems reviewed and are negative.     Objective:   Physical Exam Vitals reviewed.  Constitutional:      Appearance: Normal appearance. He is normal weight. He is not ill-appearing or diaphoretic.  HENT:     Mouth/Throat:     Mouth: Mucous membranes are moist.  Cardiovascular:     Rate and Rhythm: Normal rate and regular rhythm.     Heart sounds: Normal heart sounds. No murmur heard.   No friction rub. No gallop.  Pulmonary:     Effort: Pulmonary effort is normal. No respiratory distress.     Breath sounds: Normal breath sounds. No stridor. No wheezing, rhonchi or rales.  Abdominal:     General: Abdomen is flat. Bowel sounds are normal. There is no distension.     Palpations: Abdomen is soft.  Tenderness: There is no abdominal tenderness. There is no guarding or rebound.  Musculoskeletal:     Right lower leg: No edema.     Left lower leg: No edema.  Neurological:     Mental Status: He is alert.          Assessment & Plan:  General medical exam  Essential hypertension  Hyperlipidemia, unspecified hyperlipidemia type  Coronary artery disease involving native coronary artery of native heart without angina pectoris  Common bile duct transection, sequela Clinically the patient looks  excellent.  I recommended a flu shot which he declined.  He does not require any cancer screening.  His cholesterol is outstanding and at goal.  His blood sugar is mildly elevated at 031 but certainly not clinically significant given his advanced age.  Renal function is normal.  White count is normal.  Bilirubin, AST, and ALT are elevated but actually slightly better than that obtained at [facility] in September.  I have given this information to the patient and his wife and instructed him to contact Dr. Carlis Abbott at [facility].  Given the fact he is completely asymptomatic and his labs are stable, I would recommend following this every 3 to 6 months which I would be happy to do here.  I suspect that there may be some scar tissue causing mild stricture in the biliary tree that could be causing chronic elevation in his liver function test.  Given his frail health, I would not treat this unless absolutely necessary.  However I want to run this by his surgeon at [facility] for.  If they are comfortable with this plan we will plan on monitoring his liver function test every 3 months.  However again, I informed the patient that he should contact his surgeon and get their opinion.

## 2021-09-19 ENCOUNTER — Telehealth: Payer: Self-pay

## 2021-09-19 NOTE — Telephone Encounter (Signed)
Pt's spouse called in requesting a refill of HYDROcodone-acetaminophen (NORCO/VICODIN) 5-325 MG tablet

## 2021-09-20 MED ORDER — HYDROCODONE-ACETAMINOPHEN 5-325 MG PO TABS: 1.0000 | ORAL_TABLET | Freq: Four times a day (QID) | ORAL | 0 refills | Status: DC | PRN

## 2021-10-31 ENCOUNTER — Telehealth: Payer: Self-pay

## 2021-10-31 NOTE — Telephone Encounter (Signed)
Pt's daughter called in stating that pt has been having spells of having low grade fevers, and a HR of 130-106. Pt's daughter states that pt states this may be a malaria attack. Informed that pt may need to make an ov. Pt's daughter would like a call  back from pcp/and or nurse before scheduling please. Please advise.  Cb#: [phone]

## 2021-10-31 NOTE — Telephone Encounter (Signed)
Spoke with patient's daughter. She is not on a DPR so we are limited in our communications with her. I did advise her that based on her message the patient would need an OV to discuss his symptoms. She wanted to know if testing could be done to determine if it is malaria, I advised Dr. Dennard Schaumann would need to evaluate the patient and would then determine the best options for possible testing and/or treatment. She states she will speak with the patient and see if he would like to come in, if so they will call to schedule. Nothing further needed at this time.

## 2021-11-05 ENCOUNTER — Encounter: Payer: Self-pay | Admitting: Family Medicine

## 2021-11-05 ENCOUNTER — Ambulatory Visit
Admission: RE | Admit: 2021-11-05 | Discharge: 2021-11-05 | Disposition: A | Discharge status: Home / Self Care | Payer: No Typology Code available for payment source | Source: Ambulatory Visit | Attending: Family Medicine | Admitting: Family Medicine

## 2021-11-05 ENCOUNTER — Ambulatory Visit (INDEPENDENT_AMBULATORY_CARE_PROVIDER_SITE_OTHER): Payer: Medicare HMO | Admitting: Family Medicine

## 2021-11-05 ENCOUNTER — Other Ambulatory Visit: Payer: Self-pay

## 2021-11-05 VITALS — BP 132/62 | HR 74 | Temp 98.2°F | Resp 18 | Ht 68.0 in | Wt 140.0 lb

## 2021-11-05 DIAGNOSIS — R509 Fever, unspecified: Secondary | ICD-10-CM | POA: Diagnosis not present

## 2021-11-05 DIAGNOSIS — R7881 Bacteremia: Secondary | ICD-10-CM | POA: Diagnosis not present

## 2021-11-05 LAB — CULTURE, BLOOD (SINGLE)
MICRO NUMBER:: 13037883
SPECIMEN QUALITY:: ADEQUATE

## 2021-11-05 NOTE — Progress Notes (Signed)
Subjective:    Patient ID: Randy Thomas, male    DOB: 10/29/1935, 86 y.o.   MRN: [id number]  Fever    Patient is an 86 year old Caucasian gentleman with a complicated past medical history including coronary artery disease that is post percutaneous stenting x2.  He also has a history of a laparoscopic cholecystectomy.  However he suffered an injury to the bile duct during the surgery in 2021 and was transferred to [facility] for bile duct reconstruction.  This was performed by Dr. Carlis Abbott at [facility].   Patient states that he is here today for fever.  Ever since his surgery more than a year ago, he reports having intermittent fevers.  They were occur every 4 to 5 days.  They will be near 100 degrees.  He has tachycardia associated with the fever.  Following day, he will usually be tired and lethargic.  Then his symptoms resolved.  He states this is been going on now for months.  He believes that he has malaria.  He states that he was diagnosed with malaria while serving in [place] in the 60s.  He had frequent "recurrences" until the 1990s.  He has not had any recurrences of fever malaise and fatigue related to malaria since then.  Otherwise he denies any other symptoms.  He denies any cough, chest pain, shortness of breath.  He denies any nausea or vomiting.  He denies any melena or hematochezia.  He denies abdominal pain.  However he has lost weight: Wt Readings from Last 3 Encounters:  11/05/21 140 lb (63.5 kg)  09/12/21 144 lb (65.3 kg)  01/02/21 145 lb (65.8 kg)      Past Medical History:  Diagnosis Date   Allergy    GERD (gastroesophageal reflux disease)    Hard of hearing    Hypercholesteremia    Hyperlipidemia    Hypertension    Myocardial infarction ([place])    X 2 stents   Past Surgical History:  Procedure Laterality Date   CARDIAC CATHETERIZATION     X 2 stents   CHOLECYSTECTOMY N/A 05/20/2020   Procedure: LAPAROSCOPIC CHOLECYSTECTOMY;  Surgeon: [name], MD;   Location: [facility];  Service: General;  Laterality: N/A;   ERCP N/A 05/18/2020   Procedure: ENDOSCOPIC RETROGRADE CHOLANGIOPANCREATOGRAPHY (ERCP) ;  Surgeon: Rogene Houston, MD;  Location: [facility] SU[facility];  Service: Endoscopy;  Laterality: N/A;   ERCP N/A 05/20/2020   Procedure: ENDOSCOPIC RETROGRADE CHOLANGIOPANCREATOGRAPHY (ERCP);  Surgeon: [name], MD;  Location: [facility];  Service: General;  Laterality: N/A;   Clinton wore cast   SPHINCTEROTOMY  05/18/2020   Procedure: SPHINCTEROTOMY mild;  Surgeon: Rogene Houston, MD;  Location: [facility] SU[facility];  Service: Endoscopy;;   TONSILLECTOMY     as a child   Current Outpatient Medications on File Prior to Visit  Medication Sig Dispense Refill   Ascorbic Acid (VITAMIN C) 1000 MG tablet Take 1,000 mg by mouth daily.     aspirin EC 81 MG tablet Take 1 tablet (81 mg total) by mouth daily. 30 tablet 3   cetirizine (ZYRTEC) 10 MG tablet TAKE 1 TABLET(10 MG) BY MOUTH DAILY 30 tablet 11   cholecalciferol (VITAMIN D) 1000 units tablet Take 1 tablet (1,000 Units total) by mouth daily. 30 tablet 3   HYDROcodone-acetaminophen (NORCO/VICODIN) 5-325 MG tablet Take 1 tablet by mouth every 6 (six) hours as needed. 15 tablet 0   methocarbamol (ROBAXIN) 750 MG tablet  Take 1 tablet (750 mg total) by mouth 4 (four) times daily. (Patient taking differently: Take 750 mg by mouth 4 (four) times daily. As needed) 60 tablet 2   metoprolol succinate (TOPROL-XL) 50 MG 24 hr tablet TAKE 1/2 TABLET BY MOUTH IN THE MORNING AND 1/2 TABLET BY MOUTH IN THE EVENING (Patient taking differently: TAKE 1/2 TABLET BY MOUTH IN THE MORNING) 60 tablet 4   Multiple Vitamin (MULTIVITAMIN WITH MINERALS) TABS tablet Take 1 tablet by mouth daily. 30 tablet 3   nitroGLYCERIN (NITROSTAT) 0.4 MG SL tablet DISSOLVE ONE TABLET UNDER THE TONGUE AS DIRECTED BY YOUR MEDICAL PROVIDER FOR CHEST PAIN (REPEAT EVERY 5 MINUTES IF NEEDED FOR A TOTAL OF 3 TABLETS IN 15 MINUTES. IF NO RELIEF,  CALL 911.)     pantoprazole (PROTONIX) 40 MG tablet Take 40 mg by mouth daily.     ramipril (ALTACE) 2.5 MG capsule Take 1 capsule by mouth daily.     VASCEPA 1 g capsule Take 1 capsule by mouth in the morning and at bedtime.     vitamin B-12 (CYANOCOBALAMIN) 50 MCG tablet Take 50 mcg by mouth daily.     No current facility-administered medications on file prior to visit.   No Known Allergies  Social History   Socioeconomic History   Marital status: Married    Spouse name: Not on file   Number of children: Not on file   Years of education: Not on file   Highest education level: Not on file  Occupational History   Occupation: retired    Comment: truck driver  Tobacco Use   Smoking status: Former    Types: Cigarettes    Quit date: 09/15/1981    Years since quitting: 40.1   Smokeless tobacco: Never  Vaping Use   Vaping Use: Never used  Substance and Sexual Activity   Alcohol use: No    Alcohol/week: 0.0 standard drinks   Drug use: No   Sexual activity: Not Currently  Other Topics Concern   Not on file  Social History Narrative   Not on file   Social Determinants of Health   Financial Resource Strain: Not on file  Food Insecurity: Not on file  Transportation Needs: Not on file  Physical Activity: Not on file  Stress: Not on file  Social Connections: Not on file  Intimate Partner Violence: Not on file    Review of Systems  Constitutional:  Positive for fever.  All other systems reviewed and are negative.     Objective:   Physical Exam Vitals reviewed.  Constitutional:      Appearance: Normal appearance. He is normal weight. He is not ill-appearing or diaphoretic.  HENT:     Mouth/Throat:     Mouth: Mucous membranes are moist.  Cardiovascular:     Rate and Rhythm: Normal rate and regular rhythm.     Heart sounds: Normal heart sounds. No murmur heard.   No friction rub. No gallop.  Pulmonary:     Effort: Pulmonary effort is normal. No respiratory distress.      Breath sounds: Normal breath sounds. No stridor. No wheezing, rhonchi or rales.  Abdominal:     General: Abdomen is flat. Bowel sounds are normal. There is no distension.     Palpations: Abdomen is soft.     Tenderness: There is no abdominal tenderness. There is no guarding or rebound.  Musculoskeletal:     Right lower leg: No edema.     Left lower leg: No edema.  Neurological:     Mental Status: He is alert.          Assessment & Plan:  FUO (fever of unknown origin) - Plan: CBC with Differential/Platelet, COMPLETE METABOLIC PANEL WITH GFR, Urinalysis, Routine w reflex microscopic, Sedimentation rate, Hepatitis B surface antibody,quantitative, Hepatitis panel, acute, DG Chest 2 View, Culture, blood (single) w Reflex to ID Panel, Malaria Smear I do not believe that the patient will have reactivation of malaria after more than 20 years.  I do not believe it would like dormant that long.  I am concerned that the fever of unknown origin related to some.  He does have persistently elevated liver function test.  I will begin by checking a CBC, CMP, urinalysis, blood culture, hepatitis C serology, hepatitis B serology, and a malaria smear.  I will also check a chest x-ray.  If lab work is normal, I will proceed with imaging of the chest abdomen and pelvis to evaluate for malignancy as the patient denies any other symptoms of infection.  Spent more than 30 minutes today with the patient

## 2021-11-06 LAB — URINALYSIS, ROUTINE W REFLEX MICROSCOPIC
Bilirubin Urine: NEGATIVE
Glucose, UA: NEGATIVE
Hgb urine dipstick: NEGATIVE
Ketones, ur: NEGATIVE
Leukocytes,Ua: NEGATIVE
Nitrite: NEGATIVE
Protein, ur: NEGATIVE
Specific Gravity, Urine: 1.022 (ref 1.001–1.035)
pH: 6 (ref 5.0–8.0)

## 2021-11-06 LAB — COMPLETE METABOLIC PANEL WITH GFR
AG Ratio: 1.2 (calc) (ref 1.0–2.5)
ALT: 86 U/L — ABNORMAL HIGH (ref 9–46)
AST: 135 U/L — ABNORMAL HIGH (ref 10–35)
Albumin: 3.6 g/dL (ref 3.6–5.1)
Alkaline phosphatase (APISO): 547 U/L — ABNORMAL HIGH (ref 35–144)
BUN/Creatinine Ratio: 16 (calc) (ref 6–22)
BUN: 11 mg/dL (ref 7–25)
CO2: 23 mmol/L (ref 20–32)
Calcium: 8.2 mg/dL — ABNORMAL LOW (ref 8.6–10.3)
Chloride: 106 mmol/L (ref 98–110)
Creat: 0.67 mg/dL — ABNORMAL LOW (ref 0.70–1.22)
Globulin: 3.1 g/dL (calc) (ref 1.9–3.7)
Glucose, Bld: 99 mg/dL (ref 65–99)
Potassium: 4 mmol/L (ref 3.5–5.3)
Sodium: 140 mmol/L (ref 135–146)
Total Bilirubin: 1.2 mg/dL (ref 0.2–1.2)
Total Protein: 6.7 g/dL (ref 6.1–8.1)
eGFR: 93 mL/min/{1.73_m2} (ref >=60)

## 2021-11-06 LAB — CBC WITH DIFFERENTIAL/PLATELET
Absolute Monocytes: 643 cells/uL (ref 200–950)
Basophils Absolute: 71 cells/uL (ref 0–200)
Basophils Relative: 1.2 %
Eosinophils Absolute: 153 cells/uL (ref 15–500)
Eosinophils Relative: 2.6 %
HCT: 33.6 % — ABNORMAL LOW (ref 38.5–50.0)
Hemoglobin: 10.8 g/dL — ABNORMAL LOW (ref 13.2–17.1)
Lymphs Abs: 1322 cells/uL (ref 850–3900)
MCH: 27.8 pg (ref 27.0–33.0)
MCHC: 32.1 g/dL (ref 32.0–36.0)
MCV: 86.4 fL (ref 80.0–100.0)
MPV: 12.5 fL (ref 7.5–12.5)
Monocytes Relative: 10.9 %
Neutro Abs: 3711 cells/uL (ref 1500–7800)
Neutrophils Relative %: 62.9 %
Platelets: 154 10*3/uL (ref 140–400)
RBC: 3.89 10*6/uL — ABNORMAL LOW (ref 4.20–5.80)
RDW: 14.4 % (ref 11.0–15.0)
Total Lymphocyte: 22.4 %
WBC: 5.9 10*3/uL (ref 3.8–10.8)

## 2021-11-06 LAB — HEPATITIS PANEL, ACUTE
Hep A IgM: NONREACTIVE
Hep B C IgM: NONREACTIVE
Hepatitis B Surface Ag: NONREACTIVE
Hepatitis C Ab: NONREACTIVE
SIGNAL TO CUT-OFF: 0.08 (ref <1.00)

## 2021-11-06 LAB — HEPATITIS B SURFACE ANTIBODY, QUANTITATIVE: Hep B S AB Quant (Post): 347 m[IU]/mL (ref >=10)

## 2021-11-06 LAB — SEDIMENTATION RATE: Sed Rate: 56 mm/h — ABNORMAL HIGH (ref 0–20)

## 2021-11-07 ENCOUNTER — Emergency Department (HOSPITAL_COMMUNITY): Payer: No Typology Code available for payment source

## 2021-11-07 ENCOUNTER — Inpatient Hospital Stay (HOSPITAL_COMMUNITY)
Admission: EM | Admit: 2021-11-07 | Discharge: 2021-11-08 | DRG: 872 | Discharge status: Against Medical Advice | Payer: No Typology Code available for payment source | Attending: Internal Medicine | Admitting: Internal Medicine

## 2021-11-07 ENCOUNTER — Encounter (HOSPITAL_COMMUNITY): Payer: Self-pay | Admitting: *Deleted

## 2021-11-07 DIAGNOSIS — Z955 Presence of coronary angioplasty implant and graft: Secondary | ICD-10-CM

## 2021-11-07 DIAGNOSIS — I10 Essential (primary) hypertension: Secondary | ICD-10-CM | POA: Diagnosis present

## 2021-11-07 DIAGNOSIS — I2510 Atherosclerotic heart disease of native coronary artery without angina pectoris: Secondary | ICD-10-CM | POA: Diagnosis present

## 2021-11-07 DIAGNOSIS — Z8249 Family history of ischemic heart disease and other diseases of the circulatory system: Secondary | ICD-10-CM

## 2021-11-07 DIAGNOSIS — R7881 Bacteremia: Principal | ICD-10-CM | POA: Diagnosis present

## 2021-11-07 DIAGNOSIS — K219 Gastro-esophageal reflux disease without esophagitis: Secondary | ICD-10-CM | POA: Diagnosis present

## 2021-11-07 DIAGNOSIS — Z79899 Other long term (current) drug therapy: Secondary | ICD-10-CM

## 2021-11-07 DIAGNOSIS — I252 Old myocardial infarction: Secondary | ICD-10-CM

## 2021-11-07 DIAGNOSIS — D696 Thrombocytopenia, unspecified: Secondary | ICD-10-CM | POA: Diagnosis not present

## 2021-11-07 DIAGNOSIS — B9689 Other specified bacterial agents as the cause of diseases classified elsewhere: Secondary | ICD-10-CM | POA: Diagnosis present

## 2021-11-07 DIAGNOSIS — Z7982 Long term (current) use of aspirin: Secondary | ICD-10-CM

## 2021-11-07 DIAGNOSIS — Z20822 Contact with and (suspected) exposure to covid-19: Secondary | ICD-10-CM | POA: Diagnosis present

## 2021-11-07 DIAGNOSIS — E785 Hyperlipidemia, unspecified: Secondary | ICD-10-CM | POA: Diagnosis present

## 2021-11-07 DIAGNOSIS — K2980 Duodenitis without bleeding: Secondary | ICD-10-CM | POA: Diagnosis not present

## 2021-11-07 DIAGNOSIS — Z9049 Acquired absence of other specified parts of digestive tract: Secondary | ICD-10-CM

## 2021-11-07 DIAGNOSIS — R7401 Elevation of levels of liver transaminase levels: Secondary | ICD-10-CM | POA: Diagnosis not present

## 2021-11-07 LAB — COMPREHENSIVE METABOLIC PANEL
ALT: 89 U/L — ABNORMAL HIGH (ref 0–44)
AST: 151 U/L — ABNORMAL HIGH (ref 15–41)
Albumin: 3.5 g/dL (ref 3.5–5.0)
Alkaline Phosphatase: 490 U/L — ABNORMAL HIGH (ref 38–126)
Anion gap: 5 (ref 5–15)
BUN: 11 mg/dL (ref 8–23)
CO2: 25 mmol/L (ref 22–32)
Calcium: 8.3 mg/dL — ABNORMAL LOW (ref 8.9–10.3)
Chloride: 104 mmol/L (ref 98–111)
Creatinine, Ser: 0.59 mg/dL — ABNORMAL LOW (ref 0.61–1.24)
GFR, Estimated: >60 mL/min (ref >60)
Glucose, Bld: 131 mg/dL — ABNORMAL HIGH (ref 70–99)
Potassium: 3.9 mmol/L (ref 3.5–5.1)
Sodium: 134 mmol/L — ABNORMAL LOW (ref 135–145)
Total Bilirubin: 1.2 mg/dL (ref 0.3–1.2)
Total Protein: 7.6 g/dL (ref 6.5–8.1)

## 2021-11-07 LAB — CBC WITH DIFFERENTIAL/PLATELET
Abs Immature Granulocytes: 0.01 10*3/uL (ref 0.00–0.07)
Basophils Absolute: 0.1 10*3/uL (ref 0.0–0.1)
Basophils Relative: 1 %
Eosinophils Absolute: 0.2 10*3/uL (ref 0.0–0.5)
Eosinophils Relative: 3 %
HCT: 34.2 % — ABNORMAL LOW (ref 39.0–52.0)
Hemoglobin: 11 g/dL — ABNORMAL LOW (ref 13.0–17.0)
Immature Granulocytes: 0 %
Lymphocytes Relative: 22 %
Lymphs Abs: 1.1 10*3/uL (ref 0.7–4.0)
MCH: 28.6 pg (ref 26.0–34.0)
MCHC: 32.2 g/dL (ref 30.0–36.0)
MCV: 88.8 fL (ref 80.0–100.0)
Monocytes Absolute: 0.6 10*3/uL (ref 0.1–1.0)
Monocytes Relative: 11 %
Neutro Abs: 3.2 10*3/uL (ref 1.7–7.7)
Neutrophils Relative %: 63 %
Platelets: 138 10*3/uL — ABNORMAL LOW (ref 150–400)
RBC: 3.85 MIL/uL — ABNORMAL LOW (ref 4.22–5.81)
RDW: 15.9 % — ABNORMAL HIGH (ref 11.5–15.5)
WBC: 5.1 10*3/uL (ref 4.0–10.5)
nRBC: 0 % (ref 0.0–0.2)

## 2021-11-07 LAB — MALARIA SMEAR

## 2021-11-07 LAB — LIPASE, BLOOD: Lipase: 34 U/L (ref 11–51)

## 2021-11-07 MED ORDER — SODIUM CHLORIDE 0.9 % IV SOLN
1.0000 g | Freq: Once | INTRAVENOUS | Status: AC
  Administered 2021-11-07: 1 g via INTRAVENOUS
  Filled 2021-11-07: qty 10

## 2021-11-07 MED ORDER — SODIUM CHLORIDE 0.9 % IV BOLUS
1000.0000 mL | Freq: Once | INTRAVENOUS | Status: AC
  Administered 2021-11-07: 1000 mL via INTRAVENOUS

## 2021-11-07 MED ORDER — IOHEXOL 300 MG/ML  SOLN
100.0000 mL | Freq: Once | INTRAMUSCULAR | Status: AC | PRN
  Administered 2021-11-07: 80 mL via INTRAVENOUS

## 2021-11-07 MED ORDER — SODIUM CHLORIDE 0.9 % IV SOLN
1.0000 g | Freq: Once | INTRAVENOUS | Status: AC
  Administered 2021-11-07: 1 g via INTRAVENOUS

## 2021-11-07 NOTE — ED Provider Notes (Cosign Needed)
[facility] EMERGENCY DEPARTMENT Provider Note   CSN: [id number] Arrival date & time: 11/07/21  1223     History  Chief Complaint  Patient presents with   positive blood culture    Randy Thomas is a 86 y.o. male with history of CAD, hypertension, hyperlipidemia, cholecystitis s/p cholecystectomy, cholestatic hepatitis, acute gallstone pancreatitis, GERD who was sent here by his PCP after a positive blood culture was sent 2 days ago.  Patient has been having intermittent fevers of unknown origin for approximately 1 year following his cholecystectomy.  He went to his PCP to have this evaluated and had a full work-up sent out.  He has chronically elevated liver function values that were similar, however his blood culture returned with gram-negative bacilli.  PCP suspects either urine or gallbladder pathology.  Given patient's extensive biliary history, this is most likely cause.  Patient himself feels fine and denies chest pain, shortness of breath, abdominal pain, nausea, vomiting, diarrhea and urinary symptoms.  He notes that the fevers that he gets are usually pretty low-grade and resolve on their own.  HPI     Home Medications Prior to Admission medications   Medication Sig Start Date End Date Taking? Authorizing Provider  Ascorbic Acid (VITAMIN C) 1000 MG tablet Take 1,000 mg by mouth daily.   Yes [provider]  aspirin EC 81 MG tablet Take 1 tablet (81 mg total) by mouth daily. 11/19/16  Yes Dena Billet B, PA-C  cetirizine (ZYRTEC) 10 MG tablet TAKE 1 TABLET(10 MG) BY MOUTH DAILY Patient taking differently: Take 10 mg by mouth daily. 05/23/20  Yes Susy Frizzle, MD  cholecalciferol (VITAMIN D) 1000 units tablet Take 1 tablet (1,000 Units total) by mouth daily. 11/19/16  Yes Orlena Sheldon, PA-C  HYDROcodone-acetaminophen (NORCO/VICODIN) 5-325 MG tablet Take 1 tablet by mouth every 6 (six) hours as needed. 09/20/21  Yes Susy Frizzle, MD  methocarbamol (ROBAXIN) 750 MG  tablet Take 1 tablet (750 mg total) by mouth 4 (four) times daily. Patient taking differently: Take 750 mg by mouth 4 (four) times daily. As needed 05/03/19  Yes Pickard, Cammie Mcgee, MD  nitroGLYCERIN (NITROSTAT) 0.4 MG SL tablet Place 0.4 mg under the tongue every 5 (five) minutes as needed for chest pain. 02/16/20  Yes [provider]  pantoprazole (PROTONIX) 40 MG tablet Take 40 mg by mouth daily.   Yes [provider]  ramipril (ALTACE) 2.5 MG capsule Take 1 capsule by mouth daily. 02/16/20  Yes [provider]  VASCEPA 1 g capsule Take 1 capsule by mouth in the morning and at bedtime. 03/12/20  Yes [provider]  vitamin B-12 (CYANOCOBALAMIN) 50 MCG tablet Take 50 mcg by mouth daily.   Yes [provider]  metoprolol succinate (TOPROL-XL) 50 MG 24 hr tablet TAKE 1/2 TABLET BY MOUTH IN THE MORNING AND 1/2 TABLET BY MOUTH IN THE EVENING Patient not taking: Reported on 11/07/2021 11/19/16   Orlena Sheldon, PA-C  Multiple Vitamin (MULTIVITAMIN WITH MINERALS) TABS tablet Take 1 tablet by mouth daily. 11/19/16   Orlena Sheldon, PA-C      Allergies    Patient has no known allergies.    Review of Systems   Review of Systems  Physical Exam Updated Vital Signs BP (!) 148/65 (BP Location: Left Arm)    Pulse 73    Temp 98.1 F (36.7 C) (Oral)    Resp 18    Wt 60 kg    SpO2 96%  BMI 20.11 kg/m  Physical Exam Vitals and nursing note reviewed.  Constitutional:      General: He is not in acute distress.    Appearance: He is not ill-appearing.  HENT:     Head: Atraumatic.  Eyes:     Conjunctiva/sclera: Conjunctivae normal.  Cardiovascular:     Rate and Rhythm: Normal rate and regular rhythm.     Pulses: Normal pulses.     Heart sounds: No murmur heard. Pulmonary:     Effort: Pulmonary effort is normal. No respiratory distress.     Breath sounds: Normal breath sounds.  Abdominal:     General: Abdomen is flat. There is no distension.     Palpations:  Abdomen is soft.     Tenderness: There is no abdominal tenderness.  Musculoskeletal:        General: Normal range of motion.     Cervical back: Normal range of motion.  Skin:    General: Skin is warm and dry.     Capillary Refill: Capillary refill takes less than 2 seconds.  Neurological:     General: No focal deficit present.     Mental Status: He is alert.  Psychiatric:        Mood and Affect: Mood normal.    ED Results / Procedures / Treatments   Labs (all labs ordered are listed, but only abnormal results are displayed) Labs Reviewed  COMPREHENSIVE METABOLIC PANEL - Abnormal; Notable for the following components:      Result Value   Sodium 134 (*)    Glucose, Bld 131 (*)    Creatinine, Ser 0.59 (*)    Calcium 8.3 (*)    AST 151 (*)    ALT 89 (*)    Alkaline Phosphatase 490 (*)    All other components within normal limits  CBC WITH DIFFERENTIAL/PLATELET - Abnormal; Notable for the following components:   RBC 3.85 (*)    Hemoglobin 11.0 (*)    HCT 34.2 (*)    RDW 15.9 (*)    Platelets 138 (*)    All other components within normal limits  RESP PANEL BY RT-PCR (FLU A&B, COVID) ARPGX2  LIPASE, BLOOD    EKG None  Radiology CT ABDOMEN PELVIS W CONTRAST  Result Date: 11/07/2021 CLINICAL DATA:  Fever of unknown origin.  Positive blood cultures. EXAM: CT ABDOMEN AND PELVIS WITH CONTRAST TECHNIQUE: Multidetector CT imaging of the abdomen and pelvis was performed using the standard protocol following bolus administration of intravenous contrast. RADIATION DOSE REDUCTION: This exam was performed according to the departmental dose-optimization program which includes automated exposure control, adjustment of the mA and/or kV according to patient size and/or use of iterative reconstruction technique. CONTRAST:  71mL OMNIPAQUE IOHEXOL 300 MG/ML  SOLN COMPARISON:  CT abdomen pelvis 05/18/2020 FINDINGS: Lower chest: No acute abnormality. Hepatobiliary: No focal liver abnormality.  Interval cholecystectomy and hepaticojejunostomy with resulting mild pneumobilia in the intrahepatic and extrahepatic bile ducts. Pancreas: Diffuse moderate parenchymal atrophy. The main pancreatic duct is not dilated. No pancreatic ductal dilatation or surrounding inflammatory changes. Spleen: Normal in size without focal abnormality. Adrenals/Urinary Tract: Adrenal glands are unremarkable. A few bilateral subcentimeter round hypodensities in the renal cortices, consistent with simple renal cysts, stable. No hydronephrosis. Mildly distended urinary bladder is unremarkable. Stomach/Bowel: Postoperative changes of Roux-en-Y hepaticojejunostomy. Anastomoses in the right upper abdomen and left mid abdomen are patent. The appendix is normal. Diverticulosis of the distal descending and sigmoid colon without findings of diverticulitis. Mild retroperitoneal fat stranding  along the second portion the duodenum which demonstrates mild wall thickening. No discrete ulcer. No findings of free intraperitoneal air to suggest perforation. Vascular/Lymphatic: Aortic atherosclerosis. Enlarged 1.1 cm short axis retroperitoneal lymph node at the level of the aortic hiatus (series 2, image 24). Additional prominent node in the gastrohepatic ligament measures 0.9 cm short axis (series 2, image 22) and node at the porta hepatis measuring 1.0 cm short axis (series 2, image 27). Reproductive: Prostatomegaly. Other: Trace fluid is noted along the right lateral conal fascia. No focal fluid collection. No abdominal wall hernia or abnormality. Musculoskeletal: No acute or suspicious osseous findings. Mild leftward curvature of the lumbar spine with multilevel advanced degenerative disc disease. IMPRESSION: 1. Interval postoperative changes of cholecystectomy and Roux-en-Y hepaticojejunostomy. No bowel obstruction. 2. Mild retroperitoneal fat stranding along the second portion of the duodenum with mild duodenal wall thickening. No discrete  ulceration or findings of perforation. No focal fluid collection. Findings may represent duodenitis versus recurrent pancreatitis. Recommend correlation with labs. 3. New enlarged superior retroperitoneal lymph node, as well as other prominent nodes in the gastrohepatic ligament and at the porta hepatis, are favored to be reactive. Recommend attention on follow-up imaging. 4. Diverticulosis of the distal descending and sigmoid colon without findings of diverticulitis. 5.  Aortic Atherosclerosis (ICD10-I70.0). Electronically Signed   By: Ileana Roup M.D.   On: 11/07/2021 16:42    Procedures .Critical Care Performed by: Tonye Pearson, PA-C Authorized by: Tonye Pearson, PA-C   Critical care provider statement:    Critical care time (minutes):  45   Critical care start time:  11/07/2021 5:00 PM   Critical care end time:  11/07/2021 5:45 PM   Critical care was necessary to treat or prevent imminent or life-threatening deterioration of the following conditions:  Sepsis   Critical care was time spent personally by me on the following activities:  Development of treatment plan with patient or surrogate, discussions with consultants, evaluation of patient's response to treatment, examination of patient, ordering and review of laboratory studies, ordering and review of radiographic studies, ordering and performing treatments and interventions, pulse oximetry, re-evaluation of patient's condition and review of old charts   I assumed direction of critical care for this patient from another provider in my specialty: no     Care discussed with: admitting provider     Medications Ordered in ED Medications  cefTRIAXone (ROCEPHIN) 1 g in sodium chloride 0.9 % 100 mL IVPB (0 g Intravenous Stopped 11/07/21 1525)  sodium chloride 0.9 % bolus 1,000 mL (0 mLs Intravenous Stopped 11/07/21 1528)  iohexol (OMNIPAQUE) 300 MG/ML solution 100 mL (80 mLs Intravenous Contrast Given 11/07/21 1503)  cefTRIAXone (ROCEPHIN) 1  g in sodium chloride 0.9 % 100 mL IVPB (0 g Intravenous Stopped 11/07/21 2106)    ED Course/ Medical Decision Making/ A&P Clinical Course as of 11/07/21 2319  Thu Nov 07, 2021  1640 CT ABDOMEN PELVIS W CONTRAST [EC]    Clinical Course User Index [EC] Tonye Pearson, PA-C                           Medical Decision Making Amount and/or Complexity of Data Reviewed Labs: ordered. Radiology: ordered. Decision-making details documented in ED Course.  Risk Prescription drug management. Decision regarding hospitalization.   History:  Per HPI Social determinants of health: None  Initial impression:  This patient presents to the ED for concern of positive blood cultures, this involves an  extensive number of treatment options, and is a complaint that carries with it a high risk of complications and morbidity.   Differentials include biliary obstruction, UTI, liver pathology This is an 86 year old well-appearing male in no acute distress, normal vitals.  He has positive blood cultures drawn 2 days ago with gram-positive rods.  However he feels fine today, is nontoxic-appearing.  Abdominal exam normal.  Physical exam overall benign.  We will repeat basic labs and obtain CT abdomen pelvis.  We will also give him a dose of IV antibiotics here in the ED.   Lab Tests and EKG:  I Ordered, reviewed, and interpreted labs and EKG.  The pertinent results include:  CBC without leukocytosis CMP unremarkable Lipase normal   Imaging Studies ordered:  I ordered imaging studies including  CT abdomen pelvis without acute findings.  Mild inflammation of the duodenum. I independently visualized and interpreted imaging and I agree with the radiologist interpretation.    Cardiac Monitoring:  The patient was maintained on a cardiac monitor.  I personally viewed and interpreted the cardiac monitored which showed an underlying rhythm of: NSR   Medicines ordered and prescription drug management:  I  ordered medication including: Rocephin 2 g IV for bacteremia NS bolus Reevaluation of the patient after these medicines showed that the patient stayed the same I have reviewed the patients home medicines and have made adjustments as needed   Critical Interventions:  Sepsis prevention as described above  Consultations Obtained:  I requested consultation with infectious disease and spoke with Dr. Baxter Flattery,  and discussed lab and imaging findings as well as pertinent plan.  It is Dr. Crissie Figures professional opinion that patient is not a safe candidate to discharge home.  Given that he has gram-negative rods in his bloodstream, this is highly unlikely at a contaminated specimen.  At this point, he has an infection from an unknown source.  Dr. Baxter Flattery recommends patient being admitted for treatment and evaluation.   ED Course: 2 g Rocephin given IV.  I discussed labs and imaging with patient.  He would very much prefer to go home given that he feels fine and the rest of his labs were normal.  We discussed risks and benefits and after much discussion, he agrees to be admitted for treatment and observation.  Patient's daughter was also at bedside and prefers patient to be admitted.  Disposition:  After consideration of the diagnostic results, physical exam, history and the patients response to treatment feel that the patent would benefit from admission.   Bacteremia due to gram-negative bacteria: Patient to be admitted for further treatment and diagnostics.  Ultimate treatment and disposition to be determined by admitting provider.    Final Clinical Impression(s) / ED Diagnoses Final diagnoses:  Bacteremia due to Gram-negative bacteria    Rx / DC Orders ED Discharge Orders     None         Tonye Pearson, [place] 11/07/21 2319

## 2021-11-07 NOTE — H&P (Addendum)
History and Physical    [facility] XBM:[id number] DOB: 1936-02-26 DOA: 11/07/2021  PCP: Susy Frizzle, MD   Patient coming from: Home  I have personally briefly reviewed patient's old medical records in [place]  Chief Complaint: Fevers, bacteremia  HPI: [facility] is a 86 y.o. male with medical history significant for hypertension, myocardial infarction, dyslipidemia. Patient was called by his primary care provider to come to the ED due to positive blood cultures. Patient reports ongoing intermittent low-grade fevers since 2021 when he had a complicated cholecystectomy that required transfer to [facility].  Patient reports fevers of 99-100.  He otherwise feels fine.  He has no difficulty breathing, no abdominal pain, no vomiting or loose stools.  No urinary symptoms.  At the time of my evaluation patient is sitting in a chair, eating dinner.  Outpatient work-up done for fever included-hepatitis panel which was nonreactive, ESR of 56, UA not suggestive of UTI, malaria smear which was negative for malaria , Babesia or other parasites, unremarkable two-view chest x-ray.  ED Course: Stable vitals.  Temperature 98.1.  WBC 5.1.  Abdominal CT with contrast showed interval postop cholecystectomy and Roux-en-Y hepaticojejunostomy.  Also shows findings to suggest duodenitis versus recurrent pancreatitis.  Enlarged superior retroperitoneal lymph node favored to be reactive. IV Rocephin was started. EDP talked with Dr. Baxter Flattery who recommended admission, further work-up to determine etiology of his gram-negative bacteremia.  Review of Systems: As per HPI all other systems reviewed and negative.  Past Medical History:  Diagnosis Date   Allergy    GERD (gastroesophageal reflux disease)    Hard of hearing    Hypercholesteremia    Hyperlipidemia    Hypertension    Myocardial infarction ([place])    X 2 stents    Past Surgical History:  Procedure Laterality Date    CARDIAC CATHETERIZATION     X 2 stents   CHOLECYSTECTOMY N/A 05/20/2020   Procedure: LAPAROSCOPIC CHOLECYSTECTOMY;  Surgeon: [name], MD;  Location: [facility]S;  Service: General;  Laterality: N/A;   ERCP N/A 05/18/2020   Procedure: ENDOSCOPIC RETROGRADE CHOLANGIOPANCREATOGRAPHY (ERCP) ;  Surgeon: Rogene Houston, MD;  Location: [facility] END[facility] SUITE;  Service: Endoscopy;  Laterality: N/A;   ERCP N/A 05/20/2020   Procedure: ENDOSCOPIC RETROGRADE CHOLANGIOPANCREATOGRAPHY (ERCP);  Surgeon: [name], MD;  Location: [facility] OR[facility];  Service: General;  Laterality: N/A;   Hollister wore cast   SPHINCTEROTOMY  05/18/2020   Procedure: SPHINCTEROTOMY mild;  Surgeon: Rogene Houston, MD;  Location: [facility];  Service: Endoscopy;;   TONSILLECTOMY     as a child     reports that he quit smoking about 40 years ago. He has never used smokeless tobacco. He reports that he does not drink alcohol and does not use drugs.  No Known Allergies  Family History  Problem Relation Age of Onset   Asthma Mother    Hearing loss Mother    Heart disease Father    Diabetes Brother    Diabetes Maternal Grandmother    Colon cancer Neg Hx    Colon polyps Neg Hx     Prior to Admission medications   Medication Sig Start Date End Date Taking? Authorizing Provider  Ascorbic Acid (VITAMIN C) 1000 MG tablet Take 1,000 mg by mouth daily.   Yes [provider]  aspirin EC 81 MG tablet Take 1 tablet (81 mg total) by mouth daily. 11/19/16  Yes  Dena Billet B, PA-C  cetirizine (ZYRTEC) 10 MG tablet TAKE 1 TABLET(10 MG) BY MOUTH DAILY Patient taking differently: Take 10 mg by mouth daily. 05/23/20  Yes Susy Frizzle, MD  cholecalciferol (VITAMIN D) 1000 units tablet Take 1 tablet (1,000 Units total) by mouth daily. 11/19/16  Yes Orlena Sheldon, PA-C  HYDROcodone-acetaminophen (NORCO/VICODIN) 5-325 MG tablet Take 1 tablet by mouth every 6 (six) hours as needed. 09/20/21  Yes Susy Frizzle, MD   methocarbamol (ROBAXIN) 750 MG tablet Take 1 tablet (750 mg total) by mouth 4 (four) times daily. Patient taking differently: Take 750 mg by mouth 4 (four) times daily. As needed 05/03/19  Yes Pickard, Cammie Mcgee, MD  nitroGLYCERIN (NITROSTAT) 0.4 MG SL tablet Place 0.4 mg under the tongue every 5 (five) minutes as needed for chest pain. 02/16/20  Yes [provider]  pantoprazole (PROTONIX) 40 MG tablet Take 40 mg by mouth daily.   Yes [provider]  ramipril (ALTACE) 2.5 MG capsule Take 1 capsule by mouth daily. 02/16/20  Yes [provider]  VASCEPA 1 g capsule Take 1 capsule by mouth in the morning and at bedtime. 03/12/20  Yes [provider]  vitamin B-12 (CYANOCOBALAMIN) 50 MCG tablet Take 50 mcg by mouth daily.   Yes [provider]  metoprolol succinate (TOPROL-XL) 50 MG 24 hr tablet TAKE 1/2 TABLET BY MOUTH IN THE MORNING AND 1/2 TABLET BY MOUTH IN THE EVENING Patient not taking: Reported on 11/07/2021 11/19/16   Orlena Sheldon, PA-C  Multiple Vitamin (MULTIVITAMIN WITH MINERALS) TABS tablet Take 1 tablet by mouth daily. 11/19/16   Orlena Sheldon, PA-C    Physical Exam: Vitals:   11/07/21 1241 11/07/21 1400 11/07/21 1528 11/07/21 1800  BP:  140/60 (!) 162/69 (!) 160/70  Pulse:  70 68 70  Resp:  _0 Temp:      TempSrc:      SpO2:  98% 98% 98%  Weight: 60 kg       Constitutional: NAD, calm, comfortable Vitals:   11/07/21 1241 11/07/21 1400 11/07/21 1528 11/07/21 1800  BP:  140/60 (!) 162/69 (!) 160/70  Pulse:  70 68 70  Resp:  _1 Temp:      TempSrc:      SpO2:  98% 98% 98%  Weight: 60 kg      Eyes: PERRL, lids and conjunctivae normal ENMT: Mucous membranes are moist. Posterior pharynx clear of any exudate or lesions.Normal dentition.  Neck: normal, supple, no masses, no thyromegaly Respiratory: clear to auscultation bilaterally, no wheezing, no crackles. Normal respiratory effort. No accessory muscle use.  Cardiovascular:  Regular rate and rhythm, no murmurs / rubs / gallops. No extremity edema. 2+ pedal pulses.   Abdomen: no tenderness, no masses palpated. No hepatosplenomegaly. Bowel sounds positive.  Musculoskeletal: no clubbing / cyanosis. No joint deformity upper and lower extremities. Good ROM, no contractures. Normal muscle tone.  Skin: no rashes, lesions, ulcers. No induration Neurologic: No apparent cranial nerve abnormality, moving all extremities spontaneously. Psychiatric: Normal judgment and insight. Alert and oriented x 3. Normal mood.   Labs on Admission: I have personally reviewed following labs and imaging studies  CBC: Recent Labs  Lab 11/05/21 1435 11/07/21 1421  WBC 5.9 5.1  NEUTROABS 3,711 3.2  HGB 10.8* 11.0*  HCT 33.6* 34.2*  MCV 86.4 88.8  PLT 154 007*   Basic Metabolic Panel: Recent Labs  Lab 11/05/21 1435 11/07/21 1421  NA 140  134*  K 4.0 3.9  CL 106 104  CO2 23 25  GLUCOSE 99 131*  BUN 11 11  CREATININE 0.67* 0.59*  CALCIUM 8.2* 8.3*   GFR: Estimated Creatinine Clearance: 59.4 mL/min (A) (by C-G formula based on SCr of 0.59 mg/dL (L)). Liver Function Tests: Recent Labs  Lab 11/05/21 1435 11/07/21 1421  AST 135* 151*  ALT 86* 89*  ALKPHOS  --  490*  BILITOT 1.2 1.2  PROT 6.7 7.6  ALBUMIN  --  3.5   Recent Labs  Lab 11/07/21 1421  LIPASE 34    Urine analysis:    Component Value Date/Time   COLORURINE YELLOW 11/05/2021 1436   APPEARANCEUR CLEAR 11/05/2021 1436   LABSPEC 1.022 11/05/2021 1436   PHURINE 6.0 11/05/2021 1436   GLUCOSEU NEGATIVE 11/05/2021 1436   HGBUR NEGATIVE 11/05/2021 1436   BILIRUBINUR NEGATIVE 05/18/2020 0111   KETONESUR NEGATIVE 11/05/2021 1436   PROTEINUR NEGATIVE 11/05/2021 1436   UROBILINOGEN 0.2 09/27/2012 1445   NITRITE NEGATIVE 11/05/2021 1436   LEUKOCYTESUR NEGATIVE 11/05/2021 1436    Radiological Exams on Admission: CT ABDOMEN PELVIS W CONTRAST  Result Date: 11/07/2021 CLINICAL DATA:  Fever of unknown origin.   Positive blood cultures. EXAM: CT ABDOMEN AND PELVIS WITH CONTRAST TECHNIQUE: Multidetector CT imaging of the abdomen and pelvis was performed using the standard protocol following bolus administration of intravenous contrast. RADIATION DOSE REDUCTION: This exam was performed according to the departmental dose-optimization program which includes automated exposure control, adjustment of the mA and/or kV according to patient size and/or use of iterative reconstruction technique. CONTRAST:  25m OMNIPAQUE IOHEXOL 300 MG/ML  SOLN COMPARISON:  CT abdomen pelvis 05/18/2020 FINDINGS: Lower chest: No acute abnormality. Hepatobiliary: No focal liver abnormality. Interval cholecystectomy and hepaticojejunostomy with resulting mild pneumobilia in the intrahepatic and extrahepatic bile ducts. Pancreas: Diffuse moderate parenchymal atrophy. The main pancreatic duct is not dilated. No pancreatic ductal dilatation or surrounding inflammatory changes. Spleen: Normal in size without focal abnormality. Adrenals/Urinary Tract: Adrenal glands are unremarkable. A few bilateral subcentimeter round hypodensities in the renal cortices, consistent with simple renal cysts, stable. No hydronephrosis. Mildly distended urinary bladder is unremarkable. Stomach/Bowel: Postoperative changes of Roux-en-Y hepaticojejunostomy. Anastomoses in the right upper abdomen and left mid abdomen are patent. The appendix is normal. Diverticulosis of the distal descending and sigmoid colon without findings of diverticulitis. Mild retroperitoneal fat stranding along the second portion the duodenum which demonstrates mild wall thickening. No discrete ulcer. No findings of free intraperitoneal air to suggest perforation. Vascular/Lymphatic: Aortic atherosclerosis. Enlarged 1.1 cm short axis retroperitoneal lymph node at the level of the aortic hiatus (series 2, image 24). Additional prominent node in the gastrohepatic ligament measures 0.9 cm short axis (series 2,  image 22) and node at the porta hepatis measuring 1.0 cm short axis (series 2, image 27). Reproductive: Prostatomegaly. Other: Trace fluid is noted along the right lateral conal fascia. No focal fluid collection. No abdominal wall hernia or abnormality. Musculoskeletal: No acute or suspicious osseous findings. Mild leftward curvature of the lumbar spine with multilevel advanced degenerative disc disease. IMPRESSION: 1. Interval postoperative changes of cholecystectomy and Roux-en-Y hepaticojejunostomy. No bowel obstruction. 2. Mild retroperitoneal fat stranding along the second portion of the duodenum with mild duodenal wall thickening. No discrete ulceration or findings of perforation. No focal fluid collection. Findings may represent duodenitis versus recurrent pancreatitis. Recommend correlation with labs. 3. New enlarged superior retroperitoneal lymph node, as well as other prominent nodes in the gastrohepatic ligament and at the porta  hepatis, are favored to be reactive. Recommend attention on follow-up imaging. 4. Diverticulosis of the distal descending and sigmoid colon without findings of diverticulitis. 5.  Aortic Atherosclerosis (ICD10-I70.0). Electronically Signed   By: Ileana Roup M.D.   On: 11/07/2021 16:42    EKG: None.   Assessment/Plan Principal Problem:   Bacteremia Active Problems:   Coronary artery disease   HTN (hypertension)   Benign essential hypertension   Gram-negative bacteremia-ongoing low-grade fevers.  Afebrile here.  Not septic. Lipase and liver enzymes within normal limits.  WBC 5.1.  Doubt urinary source for bacteremia.  UA 2/21 not suggestive of UTI.  Bacteremia likely related to patient's complicated gallbladder history. - Abdominal CT-mild retroperitoneal fat stranding, findings may represent duodenitis versus recurrent pancreatitis.  Enlarged superior retroperitoneal lymph node favored to be reactive. -Will consult GI to determine significance of CT findings in the  light of his bacteremia -N.p.o. midnight -Will need repeat blood cultures in 48 hours - may need ID eval  Coronary artery disease-stable.  No chest pain. -Resume aspirin  Hypertension-  -Resume ramipril,   DVT prophylaxis: SCDS for now, pending GI eval Code Status:  Full Family Communication: Daughter at bedside Disposition Plan: ~ 2 days Consults called: GI Admission status: Inpt tele  I certify that at the point of admission it is my clinical judgment that the patient will require inpatient hospital care spanning beyond 2 midnights from the point of admission due to high intensity of service, high risk for further deterioration and high frequency of surveillance required.  Bethena Roys MD [facility]  11/07/2021, 10:20 PM

## 2021-11-07 NOTE — ED Triage Notes (Signed)
Sent from PCP because blood culture drawn 2 days ago was positive. Patient has intermittent fever of unknown origin

## 2021-11-08 ENCOUNTER — Inpatient Hospital Stay (HOSPITAL_COMMUNITY)
Admission: EM | Admit: 2021-11-08 | Discharge: 2021-11-11 | DRG: 445 | Disposition: A | Discharge status: Home / Self Care | Payer: No Typology Code available for payment source | Attending: Internal Medicine | Admitting: Internal Medicine

## 2021-11-08 ENCOUNTER — Other Ambulatory Visit: Payer: Self-pay

## 2021-11-08 ENCOUNTER — Encounter (HOSPITAL_COMMUNITY): Payer: Self-pay

## 2021-11-08 DIAGNOSIS — R7881 Bacteremia: Secondary | ICD-10-CM | POA: Diagnosis present

## 2021-11-08 DIAGNOSIS — B9620 Unspecified Escherichia coli [E. coli] as the cause of diseases classified elsewhere: Secondary | ICD-10-CM | POA: Diagnosis present

## 2021-11-08 DIAGNOSIS — Z7982 Long term (current) use of aspirin: Secondary | ICD-10-CM

## 2021-11-08 DIAGNOSIS — E7800 Pure hypercholesterolemia, unspecified: Secondary | ICD-10-CM | POA: Diagnosis present

## 2021-11-08 DIAGNOSIS — H9190 Unspecified hearing loss, unspecified ear: Secondary | ICD-10-CM | POA: Diagnosis present

## 2021-11-08 DIAGNOSIS — I2510 Atherosclerotic heart disease of native coronary artery without angina pectoris: Secondary | ICD-10-CM | POA: Diagnosis present

## 2021-11-08 DIAGNOSIS — I10 Essential (primary) hypertension: Secondary | ICD-10-CM | POA: Diagnosis present

## 2021-11-08 DIAGNOSIS — R52 Pain, unspecified: Secondary | ICD-10-CM

## 2021-11-08 DIAGNOSIS — D638 Anemia in other chronic diseases classified elsewhere: Secondary | ICD-10-CM | POA: Diagnosis present

## 2021-11-08 DIAGNOSIS — Z87891 Personal history of nicotine dependence: Secondary | ICD-10-CM

## 2021-11-08 DIAGNOSIS — Z79899 Other long term (current) drug therapy: Secondary | ICD-10-CM | POA: Diagnosis not present

## 2021-11-08 DIAGNOSIS — Z9049 Acquired absence of other specified parts of digestive tract: Secondary | ICD-10-CM

## 2021-11-08 DIAGNOSIS — Z955 Presence of coronary angioplasty implant and graft: Secondary | ICD-10-CM

## 2021-11-08 DIAGNOSIS — K8309 Other cholangitis: Secondary | ICD-10-CM | POA: Diagnosis present

## 2021-11-08 DIAGNOSIS — R7401 Elevation of levels of liver transaminase levels: Secondary | ICD-10-CM | POA: Diagnosis not present

## 2021-11-08 DIAGNOSIS — I252 Old myocardial infarction: Secondary | ICD-10-CM | POA: Diagnosis not present

## 2021-11-08 DIAGNOSIS — Z20822 Contact with and (suspected) exposure to covid-19: Secondary | ICD-10-CM | POA: Diagnosis present

## 2021-11-08 DIAGNOSIS — B9689 Other specified bacterial agents as the cause of diseases classified elsewhere: Secondary | ICD-10-CM | POA: Diagnosis present

## 2021-11-08 DIAGNOSIS — K429 Umbilical hernia without obstruction or gangrene: Secondary | ICD-10-CM | POA: Diagnosis present

## 2021-11-08 DIAGNOSIS — D696 Thrombocytopenia, unspecified: Secondary | ICD-10-CM

## 2021-11-08 DIAGNOSIS — K219 Gastro-esophageal reflux disease without esophagitis: Secondary | ICD-10-CM | POA: Diagnosis present

## 2021-11-08 DIAGNOSIS — K2980 Duodenitis without bleeding: Secondary | ICD-10-CM | POA: Diagnosis present

## 2021-11-08 DIAGNOSIS — Z8249 Family history of ischemic heart disease and other diseases of the circulatory system: Secondary | ICD-10-CM | POA: Diagnosis not present

## 2021-11-08 LAB — URINALYSIS, ROUTINE W REFLEX MICROSCOPIC
Bilirubin Urine: NEGATIVE
Glucose, UA: NEGATIVE mg/dL
Hgb urine dipstick: NEGATIVE
Ketones, ur: NEGATIVE mg/dL
Leukocytes,Ua: NEGATIVE
Nitrite: NEGATIVE
Protein, ur: NEGATIVE mg/dL
Specific Gravity, Urine: 1.01 (ref 1.005–1.030)
pH: 6.5 (ref 5.0–8.0)

## 2021-11-08 LAB — CBC WITH DIFFERENTIAL/PLATELET
Abs Immature Granulocytes: 0.01 10*3/uL (ref 0.00–0.07)
Basophils Absolute: 0.1 10*3/uL (ref 0.0–0.1)
Basophils Relative: 1 %
Eosinophils Absolute: 0.1 10*3/uL (ref 0.0–0.5)
Eosinophils Relative: 3 %
HCT: 35.7 % — ABNORMAL LOW (ref 39.0–52.0)
Hemoglobin: 11.4 g/dL — ABNORMAL LOW (ref 13.0–17.0)
Immature Granulocytes: 0 %
Lymphocytes Relative: 22 %
Lymphs Abs: 1 10*3/uL (ref 0.7–4.0)
MCH: 28.1 pg (ref 26.0–34.0)
MCHC: 31.9 g/dL (ref 30.0–36.0)
MCV: 88.1 fL (ref 80.0–100.0)
Monocytes Absolute: 0.4 10*3/uL (ref 0.1–1.0)
Monocytes Relative: 9 %
Neutro Abs: 3 10*3/uL (ref 1.7–7.7)
Neutrophils Relative %: 65 %
Platelets: 136 10*3/uL — ABNORMAL LOW (ref 150–400)
RBC: 4.05 MIL/uL — ABNORMAL LOW (ref 4.22–5.81)
RDW: 15.9 % — ABNORMAL HIGH (ref 11.5–15.5)
WBC: 4.6 10*3/uL (ref 4.0–10.5)
nRBC: 0 % (ref 0.0–0.2)

## 2021-11-08 LAB — BASIC METABOLIC PANEL
Anion gap: 8 (ref 5–15)
BUN: 9 mg/dL (ref 8–23)
CO2: 28 mmol/L (ref 22–32)
Calcium: 8.9 mg/dL (ref 8.9–10.3)
Chloride: 104 mmol/L (ref 98–111)
Creatinine, Ser: 0.59 mg/dL — ABNORMAL LOW (ref 0.61–1.24)
GFR, Estimated: >60 mL/min (ref >60)
Glucose, Bld: 110 mg/dL — ABNORMAL HIGH (ref 70–99)
Potassium: 3.6 mmol/L (ref 3.5–5.1)
Sodium: 140 mmol/L (ref 135–145)

## 2021-11-08 LAB — RESP PANEL BY RT-PCR (FLU A&B, COVID) ARPGX2
Influenza A by PCR: NEGATIVE
Influenza B by PCR: NEGATIVE
SARS Coronavirus 2 by RT PCR: NEGATIVE

## 2021-11-08 MED ORDER — AMLODIPINE 1 MG/ML ORAL SUSPENSION: 5.0000 mg | Freq: Every day | ORAL | Status: DC

## 2021-11-08 MED ORDER — ENOXAPARIN SODIUM 40 MG/0.4ML IJ SOSY
40.0000 mg | PREFILLED_SYRINGE | INTRAMUSCULAR | Status: DC
  Administered 2021-11-08 – 2021-11-10 (×3): 40 mg via SUBCUTANEOUS
  Filled 2021-11-08 (×3): qty 0.4

## 2021-11-08 MED ORDER — ASPIRIN EC 81 MG PO TBEC
81.0000 mg | DELAYED_RELEASE_TABLET | Freq: Every day | ORAL | Status: DC
  Administered 2021-11-09 – 2021-11-11 (×3): 81 mg via ORAL
  Filled 2021-11-08 (×3): qty 1

## 2021-11-08 MED ORDER — NITROGLYCERIN 0.4 MG SL SUBL: 0.4000 mg | SUBLINGUAL_TABLET | SUBLINGUAL | Status: DC | PRN

## 2021-11-08 MED ORDER — SODIUM CHLORIDE 0.9 % IV SOLN
2.0000 g | INTRAVENOUS | Status: DC
  Administered 2021-11-09 – 2021-11-10 (×2): 2 g via INTRAVENOUS
  Filled 2021-11-08 (×2): qty 20

## 2021-11-08 MED ORDER — AMLODIPINE BESYLATE 5 MG PO TABS
5.0000 mg | ORAL_TABLET | Freq: Every day | ORAL | Status: DC
Start: 2021-11-08 — End: 2021-11-11
  Administered 2021-11-08 – 2021-11-11 (×4): 5 mg via ORAL
  Filled 2021-11-08 (×4): qty 1

## 2021-11-08 MED ORDER — ENSURE ENLIVE PO LIQD
237.0000 mL | Freq: Two times a day (BID) | ORAL | Status: DC
  Administered 2021-11-09: 237 mL via ORAL

## 2021-11-08 MED ORDER — METRONIDAZOLE 500 MG/100ML IV SOLN
500.0000 mg | Freq: Two times a day (BID) | INTRAVENOUS | Status: DC
  Administered 2021-11-08 – 2021-11-11 (×6): 500 mg via INTRAVENOUS
  Filled 2021-11-08 (×5): qty 100

## 2021-11-08 MED ORDER — SODIUM CHLORIDE 0.9 % IV SOLN
2.0000 g | Freq: Once | INTRAVENOUS | Status: AC
  Administered 2021-11-08: 2 g via INTRAVENOUS
  Filled 2021-11-08: qty 20

## 2021-11-08 NOTE — ED Provider Notes (Signed)
[facility] Provider Note   CSN: [id number] Arrival date & time: 11/08/21  1615     History  Chief Complaint  Patient presents with   Results    [facility] is a 86 y.o. male presenting back to emergency department for bacteremia evaluation.  The patient was admitted to the [facility] yesterday evening with concern for positive blood cultures as an outpatient 3 days ago, with positive E. coli in his blood.  He did not want to stay in the [facility] last night and instead went home with his daughter.  He returns today and states that he is willing to stay in the [facility] at this time.  He received 2 rounds of IV antibiotics in the [facility] in the ER yesterday, and says that he does feel overall significantly better after the antibiotics.  He has no new complaints.  Denies fevers.  Per my review of the medical records, the patient did have a UA performed in the office 3 days ago with his blood cultures, and this was negative leukocytes, negative nitrites, no sign of infection  PCP Dr [facility] on 11/05/21 office note questions a possible source of the E. coli bacteremia.  Patient had a CT abdomen performed yesterday which shows postoperative changes of a cholecystectomy and a Roux-en-Y procedure, some perhaps mild duodenitis without evident ulceration or perforation, and some enlarged retroperitoneal lymph nodes.  He also diverticulosis without findings of diverticulitis.  Inpatient team and plan to consult GI, prior to the patient leaving AMA  HPI     Home Medications Prior to Admission medications   Medication Sig Start Date End Date Taking? Authorizing Provider  VASCEPA 1 g capsule Take 1 capsule by mouth in the morning and at bedtime. 03/12/20   [provider]  Ascorbic Acid (VITAMIN C) 1000 MG tablet Take 1,000 mg by mouth daily.    [provider]  aspirin EC 81 MG tablet Take 1 tablet (81 mg total) by mouth daily. 11/19/16   Orlena Sheldon, PA-C   cetirizine (ZYRTEC) 10 MG tablet TAKE 1 TABLET(10 MG) BY MOUTH DAILY Patient taking differently: Take 10 mg by mouth daily. 05/23/20   Susy Frizzle, MD  cholecalciferol (VITAMIN D) 1000 units tablet Take 1 tablet (1,000 Units total) by mouth daily. 11/19/16   Orlena Sheldon, PA-C  HYDROcodone-acetaminophen (NORCO/VICODIN) 5-325 MG tablet Take 1 tablet by mouth every 6 (six) hours as needed. 09/20/21   Susy Frizzle, MD  methocarbamol (ROBAXIN) 750 MG tablet Take 1 tablet (750 mg total) by mouth 4 (four) times daily. Patient taking differently: Take 750 mg by mouth 4 (four) times daily. As needed 05/03/19   Susy Frizzle, MD  metoprolol succinate (TOPROL-XL) 50 MG 24 hr tablet TAKE 1/2 TABLET BY MOUTH IN THE MORNING AND 1/2 TABLET BY MOUTH IN THE EVENING Patient not taking: Reported on 11/07/2021 11/19/16   Orlena Sheldon, PA-C  Multiple Vitamin (MULTIVITAMIN WITH MINERALS) TABS tablet Take 1 tablet by mouth daily. 11/19/16   Orlena Sheldon, PA-C  nitroGLYCERIN (NITROSTAT) 0.4 MG SL tablet Place 0.4 mg under the tongue every 5 (five) minutes as needed for chest pain. 02/16/20   [provider]  pantoprazole (PROTONIX) 40 MG tablet Take 40 mg by mouth daily.    [provider]  ramipril (ALTACE) 2.5 MG capsule Take 1 capsule by mouth daily. 02/16/20   [provider]  vitamin B-12 (CYANOCOBALAMIN) 50 MCG tablet Take 50 mcg by mouth  daily.    [provider]      Allergies    Patient has no known allergies.    Review of Systems   Review of Systems  Physical Exam Updated Vital Signs BP 130/78    Pulse 66    Temp 98.1 F (36.7 C) (Oral)    Resp 19    Ht 5\' 6"  (1.676 m)    Wt 62.6 kg    SpO2 96%    BMI 22.28 kg/m  Physical Exam Constitutional:      General: He is not in acute distress. HENT:     Head: Normocephalic and atraumatic.  Eyes:     Conjunctiva/sclera: Conjunctivae normal.     Pupils: Pupils are equal, round, and reactive to light.  Cardiovascular:      Rate and Rhythm: Normal rate and regular rhythm.     Pulses: Normal pulses.  Pulmonary:     Effort: Pulmonary effort is normal. No respiratory distress.  Abdominal:     General: There is no distension.     Tenderness: There is no abdominal tenderness.  Skin:    General: Skin is warm and dry.  Neurological:     General: No focal deficit present.     Mental Status: He is alert and oriented to person, place, and time. Mental status is at baseline.  Psychiatric:        Mood and Affect: Mood normal.        Behavior: Behavior normal.    ED Results / Procedures / Treatments   Labs (all labs ordered are listed, but only abnormal results are displayed) Labs Reviewed  CBC WITH DIFFERENTIAL/PLATELET - Abnormal; Notable for the following components:      Result Value   RBC 4.05 (*)    Hemoglobin 11.4 (*)    HCT 35.7 (*)    RDW 15.9 (*)    Platelets 136 (*)    All other components within normal limits  BASIC METABOLIC PANEL - Abnormal; Notable for the following components:   Glucose, Bld 110 (*)    Creatinine, Ser 0.59 (*)    All other components within normal limits  COMPREHENSIVE METABOLIC PANEL - Abnormal; Notable for the following components:   Potassium 3.3 (*)    Glucose, Bld 108 (*)    Creatinine, Ser 0.52 (*)    Calcium 8.4 (*)    Albumin 2.9 (*)    AST 123 (*)    ALT 77 (*)    Alkaline Phosphatase 422 (*)    All other components within normal limits  CBC - Abnormal; Notable for the following components:   RBC 3.50 (*)    Hemoglobin 9.9 (*)    HCT 30.5 (*)    RDW 15.9 (*)    Platelets 119 (*)    All other components within normal limits  APTT - Abnormal; Notable for the following components:   aPTT 38 (*)    All other components within normal limits  CULTURE, BLOOD (ROUTINE X 2)  CULTURE, BLOOD (ROUTINE X 2)  URINALYSIS, ROUTINE W REFLEX MICROSCOPIC  MAGNESIUM  PHOSPHORUS    EKG None  Radiology CT ABDOMEN PELVIS W CONTRAST  Result Date:  11/07/2021 CLINICAL DATA:  Fever of unknown origin.  Positive blood cultures. EXAM: CT ABDOMEN AND PELVIS WITH CONTRAST TECHNIQUE: Multidetector CT imaging of the abdomen and pelvis was performed using the standard protocol following bolus administration of intravenous contrast. RADIATION DOSE REDUCTION: This exam was performed according to the departmental dose-optimization program  which includes automated exposure control, adjustment of the mA and/or kV according to patient size and/or use of iterative reconstruction technique. CONTRAST:  38mL OMNIPAQUE IOHEXOL 300 MG/ML  SOLN COMPARISON:  CT abdomen pelvis 05/18/2020 FINDINGS: Lower chest: No acute abnormality. Hepatobiliary: No focal liver abnormality. Interval cholecystectomy and hepaticojejunostomy with resulting mild pneumobilia in the intrahepatic and extrahepatic bile ducts. Pancreas: Diffuse moderate parenchymal atrophy. The main pancreatic duct is not dilated. No pancreatic ductal dilatation or surrounding inflammatory changes. Spleen: Normal in size without focal abnormality. Adrenals/Urinary Tract: Adrenal glands are unremarkable. A few bilateral subcentimeter round hypodensities in the renal cortices, consistent with simple renal cysts, stable. No hydronephrosis. Mildly distended urinary bladder is unremarkable. Stomach/Bowel: Postoperative changes of Roux-en-Y hepaticojejunostomy. Anastomoses in the right upper abdomen and left mid abdomen are patent. The appendix is normal. Diverticulosis of the distal descending and sigmoid colon without findings of diverticulitis. Mild retroperitoneal fat stranding along the second portion the duodenum which demonstrates mild wall thickening. No discrete ulcer. No findings of free intraperitoneal air to suggest perforation. Vascular/Lymphatic: Aortic atherosclerosis. Enlarged 1.1 cm short axis retroperitoneal lymph node at the level of the aortic hiatus (series 2, image 24). Additional prominent node in the  gastrohepatic ligament measures 0.9 cm short axis (series 2, image 22) and node at the porta hepatis measuring 1.0 cm short axis (series 2, image 27). Reproductive: Prostatomegaly. Other: Trace fluid is noted along the right lateral conal fascia. No focal fluid collection. No abdominal wall hernia or abnormality. Musculoskeletal: No acute or suspicious osseous findings. Mild leftward curvature of the lumbar spine with multilevel advanced degenerative disc disease. IMPRESSION: 1. Interval postoperative changes of cholecystectomy and Roux-en-Y hepaticojejunostomy. No bowel obstruction. 2. Mild retroperitoneal fat stranding along the second portion of the duodenum with mild duodenal wall thickening. No discrete ulceration or findings of perforation. No focal fluid collection. Findings may represent duodenitis versus recurrent pancreatitis. Recommend correlation with labs. 3. New enlarged superior retroperitoneal lymph node, as well as other prominent nodes in the gastrohepatic ligament and at the porta hepatis, are favored to be reactive. Recommend attention on follow-up imaging. 4. Diverticulosis of the distal descending and sigmoid colon without findings of diverticulitis. 5.  Aortic Atherosclerosis (ICD10-I70.0). Electronically Signed   By: Ileana Roup M.D.   On: 11/07/2021 16:42    Procedures Procedures    Medications Ordered in ED Medications  metroNIDAZOLE (FLAGYL) IVPB 500 mg (500 mg Intravenous New Bag/Given 11/09/21 0851)  enoxaparin (LOVENOX) injection 40 mg (40 mg Subcutaneous Given 11/08/21 2257)  cefTRIAXone (ROCEPHIN) 2 g in sodium chloride 0.9 % 100 mL IVPB (has no administration in time range)  aspirin EC tablet 81 mg (81 mg Oral Given 11/09/21 0851)  nitroGLYCERIN (NITROSTAT) SL tablet 0.4 mg (has no administration in time range)  amLODipine (NORVASC) tablet 5 mg (5 mg Oral Given 11/09/21 0851)  feeding supplement (ENSURE ENLIVE / ENSURE PLUS) liquid 237 mL (237 mLs Oral Given 11/09/21 0853)   cefTRIAXone (ROCEPHIN) 2 g in sodium chloride 0.9 % 100 mL IVPB (0 g Intravenous Stopped 11/08/21 1915)  potassium chloride SA (KLOR-CON M) CR tablet 40 mEq (40 mEq Oral Given 11/09/21 8413)    ED Course/ Medical Decision Making/ A&P Clinical Course as of 11/09/21 0946  Fri Nov 08, 2021  2015 Admitted to hospitalist [MT]    Clinical Course User Index [MT] Wyvonnia Dusky, MD  Medical Decision Making Amount and/or Complexity of Data Reviewed Labs: ordered.  Risk Decision regarding hospitalization.   This patient presents to the ED with concern for bacteremia. This involves an extensive number of treatment options, and is a complaint that carries with it a high risk of complications and morbidity.   Co-morbidities that complicate the patient evaluation: age  Additional history obtained from daughter  External records from outside source obtained and reviewed including [facility] admissions record yesterday; CT abdomen with dudenal thickening.  E Coli blood culture positive 11/05/20  I ordered and personally interpreted labs.  The pertinent results include:  no leukocytosis.  UA without sign of infection  I ordered medication including rocephin for E Coli bacteremia I have reviewed the patients home medicines and have made adjustments as needed  Test Considered: lower suspicion for PNA  Dispostion:  After consideration of the diagnostic results and the patients response to treatment, I feel that the patent would benefit from readmission overnight for blood culture recheck/bacteremia rule out, continued on IV antibiotics.  Patient agreeable with staying          Final Clinical Impression(s) / ED Diagnoses Final diagnoses:  Bacteremia    Rx / DC Orders ED Discharge Orders     None         Donterrius Santucci, Carola Rhine, MD 11/09/21 [phone]

## 2021-11-08 NOTE — Assessment & Plan Note (Signed)
Patient denies any chest pain Continue aspirin, nitroglycerin as needed

## 2021-11-08 NOTE — ED Notes (Signed)
Patient made aware of plan of care.  Reminded of need for urine sample.  Provided with warm blanket for comfort

## 2021-11-08 NOTE — Assessment & Plan Note (Addendum)
Continue monitoring Patient denies any abdominal pain Outpatient hepatitis Panel done on 2/21 was nonreactive Outpatient sed rate done on 2/21 was 56 CT abdomen was suggestive of enlarged superior retroperitoneal lymph node favored to be reactive. Patient is postop cholecystectomy and Roux-en-Y hepaticojejunostomy RUQ ultrasound pending

## 2021-11-08 NOTE — Assessment & Plan Note (Signed)
Continue treatment as described for gram-negative bacteremia

## 2021-11-08 NOTE — H&P (Signed)
History and Physical    Patient: Randy Thomas:[id number] DOB: [birth year] DOA: 11/08/2021 DOS: the patient was seen and examined on 11/08/2021 PCP: Susy Frizzle, MD  Patient coming from: Home  Chief Complaint:  Chief Complaint  Patient presents with   Results    HPI: Randy Thomas is a 86 y.o. male with medical history significant of hypertension, myocardial infarction, hyperlipidemia who presents to the emergency department due to positive blood cultures.  Patient received a call from his PCP yesterday and was asked to go to the ED due to positive blood cultures.  He presented to the ED yesterday and was started on IV ceftriaxone, patient was going to be admitted, but he signed AMA and left the ED, patient states that it was because he was not mentally prepared for admission and that he is currently taking care of all he needed to take care of at home and wants to continue his treatment and that he was mentally prepared for admission at this time. Patient states that he had a complicated cholecystectomy which required transfer to [facility] in 2021 and that he has been having intermittent low-grade fevers in the range of 90-100 since then.  He denies any other symptomatology, patient denies chest pain, shortness of breath, nausea, vomiting, abdominal pain, irritative bladder symptoms.  ED course: In the emergency department, patient was hemodynamically stable, initial BP was 170/79 and other vital signs were within normal range.  Work-up in the ED showed normocytic anemia, thrombocytopenia, normal BMP except for CBG of 110, transaminitis.  Lipase 34.  Blood culture was positive for E. coli.  Urinalysis was negative. Outpatient work-up including hepatitis panel was nonreactive.  ESR 56, urinalysis was negative, malaria smear was negative for malaria, Babesia or other parasites. CT abdomen and pelvis with contrast done yesterday showed interval postop cholecystectomy and  Roux-en-Y hepaticojejunostomy.  Also shows findings to suggest duodenitis versus recurrent pancreatitis. Enlarged superior retroperitoneal lymph node favored to be reactive. Patient was started on IV Rocephin yesterday, this was continued and Flagyl was added.  Hospitalist was asked to admit patient for further evaluation and management.   Review of Systems: As mentioned in the history of present illness. All other systems reviewed and are negative. Past Medical History:  Diagnosis Date   Allergy    GERD (gastroesophageal reflux disease)    Hard of hearing    Hypercholesteremia    Hyperlipidemia    Hypertension    Myocardial infarction ([place])    X 2 stents   Past Surgical History:  Procedure Laterality Date   CARDIAC CATHETERIZATION     X 2 stents   CHOLECYSTECTOMY N/A 05/20/2020   Procedure: LAPAROSCOPIC CHOLECYSTECTOMY;  Surgeon: [name], MD;  Location: [facility];  Service: General;  Laterality: N/A;   ERCP N/A 05/18/2020   Procedure: ENDOSCOPIC RETROGRADE CHOLANGIOPANCREATOGRAPHY (ERCP) ;  Surgeon: Rogene Houston, MD;  Location: [facility];  Service: Endoscopy;  Laterality: N/A;   ERCP N/A 05/20/2020   Procedure: ENDOSCOPIC RETROGRADE CHOLANGIOPANCREATOGRAPHY (ERCP);  Surgeon: [name], MD;  Location: [facility];  Service: General;  Laterality: N/A;   [place] wore cast   SPHINCTEROTOMY  05/18/2020   Procedure: SPHINCTEROTOMY mild;  Surgeon: Rogene Houston, MD;  Location: [facility];  Service: Endoscopy;;   TONSILLECTOMY     as a child   Social History:  reports that he quit smoking about 40 years ago. He has never used  smokeless tobacco. He reports that he does not drink alcohol and does not use drugs.  No Known Allergies  Family History  Problem Relation Age of Onset   Asthma Mother    Hearing loss Mother    Heart disease Father    Diabetes Brother    Diabetes Maternal Grandmother    Colon cancer Neg Hx    Colon polyps Neg Hx      Prior to Admission medications   Medication Sig Start Date End Date Taking? Authorizing Provider  VASCEPA 1 g capsule Take 1 capsule by mouth in the morning and at bedtime. 03/12/20   [provider]  Ascorbic Acid (VITAMIN C) 1000 MG tablet Take 1,000 mg by mouth daily.    [provider]  aspirin EC 81 MG tablet Take 1 tablet (81 mg total) by mouth daily. 11/19/16   Orlena Sheldon, PA-C  cetirizine (ZYRTEC) 10 MG tablet TAKE 1 TABLET(10 MG) BY MOUTH DAILY Patient taking differently: Take 10 mg by mouth daily. 05/23/20   Susy Frizzle, MD  cholecalciferol (VITAMIN D) 1000 units tablet Take 1 tablet (1,000 Units total) by mouth daily. 11/19/16   Orlena Sheldon, PA-C  HYDROcodone-acetaminophen (NORCO/VICODIN) 5-325 MG tablet Take 1 tablet by mouth every 6 (six) hours as needed. 09/20/21   Susy Frizzle, MD  methocarbamol (ROBAXIN) 750 MG tablet Take 1 tablet (750 mg total) by mouth 4 (four) times daily. Patient taking differently: Take 750 mg by mouth 4 (four) times daily. As needed 05/03/19   Susy Frizzle, MD  metoprolol succinate (TOPROL-XL) 50 MG 24 hr tablet TAKE 1/2 TABLET BY MOUTH IN THE MORNING AND 1/2 TABLET BY MOUTH IN THE EVENING Patient not taking: Reported on 11/07/2021 11/19/16   Orlena Sheldon, PA-C  Multiple Vitamin (MULTIVITAMIN WITH MINERALS) TABS tablet Take 1 tablet by mouth daily. 11/19/16   Orlena Sheldon, PA-C  nitroGLYCERIN (NITROSTAT) 0.4 MG SL tablet Place 0.4 mg under the tongue every 5 (five) minutes as needed for chest pain. 02/16/20   [provider]  pantoprazole (PROTONIX) 40 MG tablet Take 40 mg by mouth daily.    [provider]  ramipril (ALTACE) 2.5 MG capsule Take 1 capsule by mouth daily. 02/16/20   [provider]  vitamin B-12 (CYANOCOBALAMIN) 50 MCG tablet Take 50 mcg by mouth daily.    [provider]    Physical Exam: Vitals:   11/08/21 1640 11/08/21 1800 11/08/21 2001  BP: (!) 170/79 (!) 134/59 (!)  154/66  Pulse: 79 76 75  Resp: 14 16 16   Temp: 98.2 F (36.8 C)  99 F (37.2 C)  TempSrc: Oral  Oral  SpO2: 98% 97% 98%  Weight: 59.9 kg    Height: 5' 8"  (1.727 m)     General: Elderly male. Awake and alert and oriented x3. Not in any acute distress.  HEENT: NCAT.  PERRLA. EOMI. Sclerae anicteric.  Moist mucosal membranes. Neck: Neck supple without lymphadenopathy. No carotid bruits. No masses palpated.  Cardiovascular: Regular rate with normal S1-S2 sounds. No murmurs, rubs or gallops auscultated. No JVD.  Respiratory: Clear breath sounds.  No accessory muscle use. Abdomen: Soft, nontender, nondistended. Active bowel sounds. No masses or hepatosplenomegaly  Skin: No rashes, lesions, or ulcerations.  Dry, warm to touch. Musculoskeletal:  2+ dorsalis pedis and radial pulses. Good ROM.  No contractures  Psychiatric: Intact judgment and insight.  Mood appropriate to current condition. Neurologic: No focal neurological deficits. Strength is 5/5 x  4.  CN II - XII grossly intact.   Data Reviewed: There are no new results to review at this time.  Assessment and Plan: * Gram-negative bacteremia- (present on admission) Patient reports several months of intermittent low-grade fevers Patient presents with transaminitis, but lipase level was normal Urinalysis was negative Patient's bacteremia may be related to his history of complicated gallbladder CT abdomen and pelvis showed mild retroperitoneal fat stranding, findings may represent duodenitis versus recurrent pancreatitis.  Enlarged superior retroperitoneal lymph node favored to be reactive. Gastroenterology will be consulted due to CT findings and we shall await further recommendation Per medical record, ED physician already talked with Dr. Baxter Flattery yesterday and admission was recommended.  ID will be reconsulted Continue IV ceftriaxone and Flagyl Blood culture pending  Duodenitis Continue treatment as described for gram-negative  bacteremia  Transaminitis AST 151, ALT 89, ALP 490 Patient denies any abdominal pain Outpatient hepatitis Panel done on 2/21 was nonreactive Outpatient sed rate done on 2/21 was 56 CT abdomen was suggestive of enlarged superior retroperitoneal lymph node favored to be reactive. Patient is postop cholecystectomy and Roux-en-Y hepaticojejunostomy RUQ ultrasound will be done in the morning  Thrombocytopenia (HCC) Platelets 136, this is possibly reactive Continue to monitor platelet levels with morning labs  HTN (hypertension)- (present on admission) Continue amlodipine 5 mg p.o. daily  Coronary artery disease- (present on admission) Patient denies any chest pain Continue aspirin, nitroglycerin as needed   Advance Care Planning:   Code Status: Full Code   Consults: Infectious disease, gastroenterology  Family Communication: Daughter at bedside (all questions answered to satisfaction)  Severity of Illness: The appropriate patient status for this patient is INPATIENT. Inpatient status is judged to be reasonable and necessary in order to provide the required intensity of service to ensure the patient's safety. The patient's presenting symptoms, physical exam findings, and initial radiographic and laboratory data in the context of their chronic comorbidities is felt to place them at high risk for further clinical deterioration. Furthermore, it is not anticipated that the patient will be medically stable for discharge from the hospital within 2 midnights of admission.   * I certify that at the point of admission it is my clinical judgment that the patient will require inpatient hospital care spanning beyond 2 midnights from the point of admission due to high intensity of service, high risk for further deterioration and high frequency of surveillance required.*  Author: Bernadette Hoit, DO 11/08/2021 9:35 PM  For on call review [email or web address].

## 2021-11-08 NOTE — Assessment & Plan Note (Signed)
Platelets 136, this is possibly reactive Continue to monitor platelet levels with morning labs

## 2021-11-08 NOTE — ED Triage Notes (Signed)
Pt to er, daughter with pt, daughter states that pt is here because his pmd called and said that he had bacteria in his blood and was to come to the er and get an abx.

## 2021-11-08 NOTE — Assessment & Plan Note (Addendum)
-  Now noted to have E. coli bacteremia Patient reports several months of intermittent low-grade fevers Patient presents with transaminitis, but lipase level was normal Urinalysis was negative Patient's bacteremia may be related to his history of complicated gallbladder CT abdomen and pelvis showed mild retroperitoneal fat stranding, findings may represent duodenitis versus recurrent pancreatitis.  Enlarged superior retroperitoneal lymph node favored to be reactive. Gastroenterology will be consulted due to CT findings and we shall await further recommendation with likely need for ERCP Hold ID consultation for now as source of infection is being evaluated by GI Continue IV ceftriaxone and Flagyl Repeat blood culture pending

## 2021-11-08 NOTE — Assessment & Plan Note (Addendum)
Continue amlodipine 5 mg p.o. daily

## 2021-11-08 NOTE — ED Notes (Signed)
Pt daughter approached me at nurses desk and advised that she did not understand why "pt is waiting over night with nothing being done." Myself and charge nurse explained that pt is receiving IV antibiotics and waiting on a bed upstairs. Pt then approached nurses desk later and demanded to be discharged and says "I'm not staying all night with nothing being done." Pt advised that he does not want to wait for a bed upstairs to be admitted any longer. Pt IV removed and pt signed AMA form.

## 2021-11-09 ENCOUNTER — Inpatient Hospital Stay (HOSPITAL_COMMUNITY): Payer: No Typology Code available for payment source

## 2021-11-09 DIAGNOSIS — R7401 Elevation of levels of liver transaminase levels: Secondary | ICD-10-CM | POA: Diagnosis not present

## 2021-11-09 DIAGNOSIS — R7881 Bacteremia: Secondary | ICD-10-CM | POA: Diagnosis not present

## 2021-11-09 LAB — MAGNESIUM: Magnesium: 1.9 mg/dL (ref 1.7–2.4)

## 2021-11-09 LAB — COMPREHENSIVE METABOLIC PANEL
ALT: 77 U/L — ABNORMAL HIGH (ref 0–44)
AST: 123 U/L — ABNORMAL HIGH (ref 15–41)
Albumin: 2.9 g/dL — ABNORMAL LOW (ref 3.5–5.0)
Alkaline Phosphatase: 422 U/L — ABNORMAL HIGH (ref 38–126)
Anion gap: 6 (ref 5–15)
BUN: 9 mg/dL (ref 8–23)
CO2: 27 mmol/L (ref 22–32)
Calcium: 8.4 mg/dL — ABNORMAL LOW (ref 8.9–10.3)
Chloride: 107 mmol/L (ref 98–111)
Creatinine, Ser: 0.52 mg/dL — ABNORMAL LOW (ref 0.61–1.24)
GFR, Estimated: >60 mL/min (ref >60)
Glucose, Bld: 108 mg/dL — ABNORMAL HIGH (ref 70–99)
Potassium: 3.3 mmol/L — ABNORMAL LOW (ref 3.5–5.1)
Sodium: 140 mmol/L (ref 135–145)
Total Bilirubin: 0.8 mg/dL (ref 0.3–1.2)
Total Protein: 6.5 g/dL (ref 6.5–8.1)

## 2021-11-09 LAB — CBC
HCT: 30.5 % — ABNORMAL LOW (ref 39.0–52.0)
Hemoglobin: 9.9 g/dL — ABNORMAL LOW (ref 13.0–17.0)
MCH: 28.3 pg (ref 26.0–34.0)
MCHC: 32.5 g/dL (ref 30.0–36.0)
MCV: 87.1 fL (ref 80.0–100.0)
Platelets: 119 10*3/uL — ABNORMAL LOW (ref 150–400)
RBC: 3.5 MIL/uL — ABNORMAL LOW (ref 4.22–5.81)
RDW: 15.9 % — ABNORMAL HIGH (ref 11.5–15.5)
WBC: 4.1 10*3/uL (ref 4.0–10.5)
nRBC: 0 % (ref 0.0–0.2)

## 2021-11-09 LAB — APTT: aPTT: 38 seconds — ABNORMAL HIGH (ref 24–36)

## 2021-11-09 LAB — PHOSPHORUS: Phosphorus: 3.2 mg/dL (ref 2.5–4.6)

## 2021-11-09 MED ORDER — ADULT MULTIVITAMIN W/MINERALS CH
1.0000 | ORAL_TABLET | Freq: Every day | ORAL | Status: DC
  Administered 2021-11-09 – 2021-11-11 (×3): 1 via ORAL
  Filled 2021-11-09 (×3): qty 1

## 2021-11-09 MED ORDER — ENSURE ENLIVE PO LIQD
237.0000 mL | Freq: Three times a day (TID) | ORAL | Status: DC
  Administered 2021-11-09 – 2021-11-11 (×6): 237 mL via ORAL

## 2021-11-09 MED ORDER — POTASSIUM CHLORIDE CRYS ER 20 MEQ PO TBCR
40.0000 meq | EXTENDED_RELEASE_TABLET | Freq: Once | ORAL | Status: AC
  Administered 2021-11-09: 40 meq via ORAL
  Filled 2021-11-09: qty 2

## 2021-11-09 NOTE — Progress Notes (Signed)
PROGRESS NOTE    Randy Thomas  QIO:[id number] DOB: 26-May-1936 DOA: 11/08/2021 PCP: Susy Frizzle, MD   Brief Narrative:  Per HPI: Randy Thomas is a 86 y.o. male with medical history significant of hypertension, myocardial infarction, hyperlipidemia who presents to the emergency department due to positive blood cultures.  Patient received a call from his PCP yesterday and was asked to go to the ED due to positive blood cultures.  He presented to the ED yesterday and was started on IV ceftriaxone, patient was going to be admitted, but he signed AMA and left the ED, patient states that it was because he was not mentally prepared for admission and that he is currently taking care of all he needed to take care of at home and wants to continue his treatment and that he was mentally prepared for admission at this time. Patient states that he had a complicated cholecystectomy which required transfer to [facility] in 2021 and that he has been having intermittent low-grade fevers in the range of 90-100 since then.  He denies any other symptomatology, patient denies chest pain, shortness of breath, nausea, vomiting, abdominal pain, irritative bladder symptoms.  11/09/21: Patient has been admitted for E. coli bacteremia that is likely of GI source.  Appreciate further evaluation with GI with likely need for MRCP and/or eventual transfer to [facility].  Continue IV antibiotics as ordered for now.  Patient is otherwise asymptomatic at this time.    Assessment & Plan:   Principal Problem:   Gram-negative bacteremia Active Problems:   Coronary artery disease   HTN (hypertension)   Transaminitis   Thrombocytopenia (HCC)   Duodenitis  Assessment and Plan: * Gram-negative bacteremia- (present on admission) -Now noted to have E. coli bacteremia Patient reports several months of intermittent low-grade fevers Patient presents with transaminitis, but lipase level was normal Urinalysis was  negative Patient's bacteremia may be related to his history of complicated gallbladder CT abdomen and pelvis showed mild retroperitoneal fat stranding, findings may represent duodenitis versus recurrent pancreatitis.  Enlarged superior retroperitoneal lymph node favored to be reactive. Gastroenterology will be consulted due to CT findings and we shall await further recommendation with likely need for ERCP Hold ID consultation for now as source of infection is being evaluated by GI Continue IV ceftriaxone and Flagyl Repeat blood culture pending  Duodenitis Continue treatment as described for gram-negative bacteremia  Thrombocytopenia (HCC) Platelets 136, this is possibly reactive Continue to monitor platelet levels with morning labs  Transaminitis Continue monitoring Patient denies any abdominal pain Outpatient hepatitis Panel done on 2/21 was nonreactive Outpatient sed rate done on 2/21 was 56 CT abdomen was suggestive of enlarged superior retroperitoneal lymph node favored to be reactive. Patient is postop cholecystectomy and Roux-en-Y hepaticojejunostomy RUQ ultrasound pending  HTN (hypertension)- (present on admission) Continue amlodipine 5 mg p.o. daily  Coronary artery disease- (present on admission) Patient denies any chest pain Continue aspirin, nitroglycerin as needed   DVT prophylaxis: Lovenox Code Status: Full Family Communication: Daughter at bedside 2/25 Disposition Plan:  Status is: Inpatient Remains inpatient appropriate because: Continues to require IV antibiotics and further monitoring.  Consultants:  GI  Procedures:  See below  Antimicrobials:  Anti-infectives (From admission, onward)    Start     Dose/Rate Route Frequency Ordered Stop   11/09/21 1800  cefTRIAXone (ROCEPHIN) 2 g in sodium chloride 0.9 % 100 mL IVPB        2 g 200 mL/hr over 30 Minutes  Intravenous Every 24 hours 11/08/21 2123     11/08/21 2030  metroNIDAZOLE (FLAGYL) IVPB 500 mg         500 mg 100 mL/hr over 60 Minutes Intravenous Every 12 hours 11/08/21 2015     11/08/21 1815  cefTRIAXone (ROCEPHIN) 2 g in sodium chloride 0.9 % 100 mL IVPB        2 g 200 mL/hr over 30 Minutes Intravenous  Once 11/08/21 1810 11/08/21 1915       Subjective: Patient seen and evaluated today with no new acute complaints or concerns. No acute concerns or events noted overnight.  Objective: Vitals:   11/08/21 2153 11/08/21 2154 11/09/21 0459 11/09/21 0851  BP:  (!) 168/76 (!) 127/55 130/78  Pulse:  77 66   Resp:  20 19   Temp:  98 F (36.7 C) 98.1 F (36.7 C)   TempSrc:  Oral Oral   SpO2:  98% 96%   Weight: 62.6 kg     Height: 5\' 6"  (1.676 m)       Intake/Output Summary (Last 24 hours) at 11/09/2021 0947 Last data filed at 11/09/2021 0300 Gross per 24 hour  Intake 200 ml  Output --  Net 200 ml   Filed Weights   11/08/21 1640 11/08/21 2153  Weight: 59.9 kg 62.6 kg    Examination:  General exam: Appears calm and comfortable  Respiratory system: Clear to auscultation. Respiratory effort normal. Cardiovascular system: S1 & S2 heard, RRR.  Gastrointestinal system: Abdomen is soft Central nervous system: Alert and awake Extremities: No edema Skin: No significant lesions noted Psychiatry: Flat affect.    Data Reviewed: I have personally reviewed following labs and imaging studies  CBC: Recent Labs  Lab 11/05/21 1435 11/07/21 1421 11/08/21 1818 11/09/21 0424  WBC 5.9 5.1 4.6 4.1  NEUTROABS 3,711 3.2 3.0  --   HGB 10.8* 11.0* 11.4* 9.9*  HCT 33.6* 34.2* 35.7* 30.5*  MCV 86.4 88.8 88.1 87.1  PLT 154 138* 136* 568*   Basic Metabolic Panel: Recent Labs  Lab 11/05/21 1435 11/07/21 1421 11/08/21 1818 11/09/21 0424  NA 140 134* 140 140  K 4.0 3.9 3.6 3.3*  CL 106 104 104 107  CO2 23 25 28 27   GLUCOSE 99 131* 110* 108*  BUN 11 11 9 9   CREATININE 0.67* 0.59* 0.59* 0.52*  CALCIUM 8.2* 8.3* 8.9 8.4*  MG  --   --   --  1.9  PHOS  --   --   --  3.2    GFR: Estimated Creatinine Clearance: 61.9 mL/min (A) (by C-G formula based on SCr of 0.52 mg/dL (L)). Liver Function Tests: Recent Labs  Lab 11/05/21 1435 11/07/21 1421 11/09/21 0424  AST 135* 151* 123*  ALT 86* 89* 77*  ALKPHOS  --  490* 422*  BILITOT 1.2 1.2 0.8  PROT 6.7 7.6 6.5  ALBUMIN  --  3.5 2.9*   Recent Labs  Lab 11/07/21 1421  LIPASE 34   No results for input(s): AMMONIA in the last 168 hours. Coagulation Profile: No results for input(s): INR, PROTIME in the last 168 hours. Cardiac Enzymes: No results for input(s): CKTOTAL, CKMB, CKMBINDEX, TROPONINI in the last 168 hours. BNP (last 3 results) No results for input(s): PROBNP in the last 8760 hours. HbA1C: No results for input(s): HGBA1C in the last 72 hours. CBG: No results for input(s): GLUCAP in the last 168 hours. Lipid Profile: No results for input(s): CHOL, HDL, LDLCALC, TRIG, CHOLHDL, LDLDIRECT in the last  72 hours. Thyroid Function Tests: No results for input(s): TSH, T4TOTAL, FREET4, T3FREE, THYROIDAB in the last 72 hours. Anemia Panel: No results for input(s): VITAMINB12, FOLATE, FERRITIN, TIBC, IRON, RETICCTPCT in the last 72 hours. Sepsis Labs: No results for input(s): PROCALCITON, LATICACIDVEN in the last 168 hours.  Recent Results (from the past 240 hour(s))  Culture, blood (single) w Reflex to ID Panel     Status: Abnormal   Collection Time: 11/05/21  2:36 PM   Specimen: Blood  Result Value Ref Range Status   MICRO NUMBER: [id number]  Final   SPECIMEN QUALITY: Adequate  Final   Source L AC  Final   STATUS: FINAL  Final   ISOLATE 1: Escherichia coli (AA)  Final    Comment: Escherichia coli , from aerobic and anaerobic bottles   COMMENT: Aerobic and anaerobic bottle received.  Final      Susceptibility   Escherichia coli - BLOOD CULTURE, NEGATIVE 1    AMOX/CLAVULANIC <=2 Sensitive     AMPICILLIN <=2 Sensitive     AMPICILLIN/SULBACTAM <=2 Sensitive     CEFAZOLIN* <=4 Not Reportable       * For infections other than uncomplicated UTI caused by E. coli, K. pneumoniae or P. mirabilis: Cefazolin is resistant if MIC > or = 8 mcg/mL. (Distinguishing susceptible versus intermediate for isolates with MIC < or = 4 mcg/mL requires additional testing.)     CEFTAZIDIME <=1 Sensitive     CEFEPIME <=1 Sensitive     CEFTRIAXONE <=1 Sensitive     CIPROFLOXACIN <=0.25 Sensitive     LEVOFLOXACIN <=0.12 Sensitive     GENTAMICIN <=1 Sensitive     IMIPENEM <=0.25 Sensitive     PIP/TAZO <=4 Sensitive     TOBRAMYCIN <=1 Sensitive     TRIMETH/SULFA* <=20 Sensitive      * For infections other than uncomplicated UTI caused by E. coli, K. pneumoniae or P. mirabilis: Cefazolin is resistant if MIC > or = 8 mcg/mL. (Distinguishing susceptible versus intermediate for isolates with MIC < or = 4 mcg/mL requires additional testing.) Legend: S = Susceptible  I = Intermediate R = Resistant  NS = Not susceptible * = Not tested  NR = Not reported **NN = See antimicrobic comments   Malaria Smear     Status: None   Collection Time: 11/05/21  2:36 PM   Specimen: Blood  Result Value Ref Range Status   Malaria/Babesia/Other Parasites  NEGATIVE Final    Comment: NEGATIVE HOWEVER, DUE TO THE CYCLICAL SHED RATES OF THESE PARASITES, ONE NEGATIVE SPECIMEN DOES NOT RULE OUT THE POSSIBILITY OF A PARASITIC INFECTION. OBTAIN SPECIMENS AT 6-HOUR INTERVALS FOR 36 HOURS FOR A COMPREHENSIVE EXAMINATION.   Resp Panel by RT-PCR (Flu A&B, Covid) Nasopharyngeal Swab     Status: None   Collection Time: 11/07/21 11:20 PM   Specimen: Nasopharyngeal Swab; Nasopharyngeal(NP) swabs in vial transport medium  Result Value Ref Range Status   SARS Coronavirus 2 by RT PCR NEGATIVE NEGATIVE Final    Comment: (NOTE) SARS-CoV-2 target nucleic acids are NOT DETECTED.  The SARS-CoV-2 RNA is generally detectable in upper respiratory specimens during the acute phase of infection. The lowest concentration of SARS-CoV-2 viral  copies this assay can detect is 138 copies/mL. A negative result does not preclude SARS-Cov-2 infection and should not be used as the sole basis for treatment or other patient management decisions. A negative result may occur with  improper specimen collection/handling, submission of specimen other than nasopharyngeal swab, presence of  viral mutation(s) within the areas targeted by this assay, and inadequate number of viral copies(<138 copies/mL). A negative result must be combined with clinical observations, patient history, and epidemiological information. The expected result is Negative.  Fact Sheet for Patients:  EntrepreneurPulse.com.au  Fact Sheet for Healthcare Providers:  IncredibleEmployment.be  This test is no t yet approved or cleared by the [place] FDA and  has been authorized for detection and/or diagnosis of SARS-CoV-2 by FDA under an Emergency Use Authorization (EUA). This EUA will remain  in effect (meaning this test can be used) for the duration of the COVID-19 declaration under Section 564(b)(1) of the Act, 21 U.S.C.section 360bbb-3(b)(1), unless the authorization is terminated  or revoked sooner.       Influenza A by PCR NEGATIVE NEGATIVE Final   Influenza B by PCR NEGATIVE NEGATIVE Final    Comment: (NOTE) The Xpert Xpress SARS-CoV-2/FLU/RSV plus assay is intended as an aid in the diagnosis of influenza from Nasopharyngeal swab specimens and should not be used as a sole basis for treatment. Nasal washings and aspirates are unacceptable for Xpert Xpress SARS-CoV-2/FLU/RSV testing.  Fact Sheet for Patients: EntrepreneurPulse.com.au  Fact Sheet for Healthcare Providers: IncredibleEmployment.be  This test is not yet approved or cleared by the [place] FDA and has been authorized for detection and/or diagnosis of SARS-CoV-2 by FDA under an Emergency Use Authorization (EUA). This  EUA will remain in effect (meaning this test can be used) for the duration of the COVID-19 declaration under Section 564(b)(1) of the Act, 21 U.S.C. section 360bbb-3(b)(1), unless the authorization is terminated or revoked.  Performed at [facility], [address] [place], [place] [address]   Blood culture (routine x 2)     Status: None (Preliminary result)   Collection Time: 11/08/21  6:19 PM   Specimen: Right Antecubital; Blood  Result Value Ref Range Status   Specimen Description RIGHT ANTECUBITAL  Final   Special Requests   Final    BOTTLES DRAWN AEROBIC AND ANAEROBIC Blood Culture adequate volume   Culture   Final    NO GROWTH < 12 HOURS Performed at [facility], [address] [place], [place] [address]    Report Status PENDING  Incomplete  Blood culture (routine x 2)     Status: None (Preliminary result)   Collection Time: 11/08/21  6:31 PM   Specimen: BLOOD LEFT ARM  Result Value Ref Range Status   Specimen Description BLOOD LEFT ARM  Final   Special Requests   Final    BOTTLES DRAWN AEROBIC AND ANAEROBIC Blood Culture adequate volume   Culture   Final    NO GROWTH < 12 HOURS Performed at [facility], [address] [place], [place] [address]    Report Status PENDING  Incomplete         Radiology Studies: CT ABDOMEN PELVIS W CONTRAST  Result Date: 11/07/2021 CLINICAL DATA:  Fever of unknown origin.  Positive blood cultures. EXAM: CT ABDOMEN AND PELVIS WITH CONTRAST TECHNIQUE: Multidetector CT imaging of the abdomen and pelvis was performed using the standard protocol following bolus administration of intravenous contrast. RADIATION DOSE REDUCTION: This exam was performed according to the departmental dose-optimization program which includes automated exposure control, adjustment of the mA and/or kV according to patient size and/or use of iterative reconstruction technique. CONTRAST:  57mL OMNIPAQUE IOHEXOL 300 MG/ML  SOLN COMPARISON:  CT abdomen pelvis 05/18/2020  FINDINGS: Lower chest: No acute abnormality. Hepatobiliary: No focal liver abnormality. Interval cholecystectomy and hepaticojejunostomy with resulting  mild pneumobilia in the intrahepatic and extrahepatic bile ducts. Pancreas: Diffuse moderate parenchymal atrophy. The main pancreatic duct is not dilated. No pancreatic ductal dilatation or surrounding inflammatory changes. Spleen: Normal in size without focal abnormality. Adrenals/Urinary Tract: Adrenal glands are unremarkable. A few bilateral subcentimeter round hypodensities in the renal cortices, consistent with simple renal cysts, stable. No hydronephrosis. Mildly distended urinary bladder is unremarkable. Stomach/Bowel: Postoperative changes of Roux-en-Y hepaticojejunostomy. Anastomoses in the right upper abdomen and left mid abdomen are patent. The appendix is normal. Diverticulosis of the distal descending and sigmoid colon without findings of diverticulitis. Mild retroperitoneal fat stranding along the second portion the duodenum which demonstrates mild wall thickening. No discrete ulcer. No findings of free intraperitoneal air to suggest perforation. Vascular/Lymphatic: Aortic atherosclerosis. Enlarged 1.1 cm short axis retroperitoneal lymph node at the level of the aortic hiatus (series 2, image 24). Additional prominent node in the gastrohepatic ligament measures 0.9 cm short axis (series 2, image 22) and node at the porta hepatis measuring 1.0 cm short axis (series 2, image 27). Reproductive: Prostatomegaly. Other: Trace fluid is noted along the right lateral conal fascia. No focal fluid collection. No abdominal wall hernia or abnormality. Musculoskeletal: No acute or suspicious osseous findings. Mild leftward curvature of the lumbar spine with multilevel advanced degenerative disc disease. IMPRESSION: 1. Interval postoperative changes of cholecystectomy and Roux-en-Y hepaticojejunostomy. No bowel obstruction. 2. Mild retroperitoneal fat stranding along  the second portion of the duodenum with mild duodenal wall thickening. No discrete ulceration or findings of perforation. No focal fluid collection. Findings may represent duodenitis versus recurrent pancreatitis. Recommend correlation with labs. 3. New enlarged superior retroperitoneal lymph node, as well as other prominent nodes in the gastrohepatic ligament and at the porta hepatis, are favored to be reactive. Recommend attention on follow-up imaging. 4. Diverticulosis of the distal descending and sigmoid colon without findings of diverticulitis. 5.  Aortic Atherosclerosis (ICD10-I70.0). Electronically Signed   By: Ileana Roup M.D.   On: 11/07/2021 16:42        Scheduled Meds:  amLODipine  5 mg Oral Daily   aspirin EC  81 mg Oral Daily   enoxaparin (LOVENOX) injection  40 mg Subcutaneous Q24H   feeding supplement  237 mL Oral BID BM   Continuous Infusions:  cefTRIAXone (ROCEPHIN)  IV     metronidazole 500 mg (11/09/21 0851)     LOS: 1 day    Time spent: 35 minutes    Derrian Poli Darleen Crocker, DO [facility]  If 7PM-7AM, please contact night-coverage [email or web address] 11/09/2021, 9:47 AM

## 2021-11-09 NOTE — Consult Note (Addendum)
Referring Provider: Heath Lark, DO Primary Care Physician:  Susy Frizzle, MD Primary Gastroenterologist:  Dr. Laural Golden  Reason for Consultation:    Abnormal LFTs and positive blood cultures were E. coli.  HPI:   Patient is 86 year old Caucasian male who has been experiencing rigors and low-grade fever for about 6 weeks.  He has not experienced nausea vomiting abdominal pain hematuria or dysuria.  He was seen by his primary care physician Dr. Hester Mates 4 days ago.  He had lab studies and blood cultures.  Transaminases and alkaline phosphatase were elevated.  Blood cultures were positive for E. coli yesterday.  Patient was therefore sent over to emergency room for further evaluation and treatment. Patient had another set of blood cultures drawn before he was started on ceftriaxone and metronidazole.  Sensitivities reveal that organism is sensitive to just about every antibiotic including ceftriaxone.  This afternoon patient has no complaints.  He states he has very good appetite.  He states he lost about 15 pounds when he was sick in September 2021 but since then his weight has been stable.  He denies nausea vomiting heartburn dysphagia melena or rectal bleeding.  His past GI history is significant for biliary pancreatitis.  He underwent ERCP with biliary sphincterotomy and stone extraction by me on 05/18/2020.  He underwent laparoscopic cholecystectomy 2 days later and unfortunately had bile duct transection confirmed with another ERCP.  Patient was transferred to [facility] and underwent urgent Roux-en-Y hepaticojejunostomy.  Dr. Bailey Mech performed 3 anastomosis.  He has done well since then.  He was last seen by him in September 2022.  Patient is married.  He drove a truck for several years.  He also worked part-time for [employer] until 2008.  He quit cigarette smoking over 40 years ago.  He has not had any alcohol since 1969.  Both parents lived to be 46.  Mother mother possibly had  ruptured intra-abdominal aneurysm.  He has 1 sister living who is in her 82s.  Another sister died in her 62s and brother died in his 61s.  He has 3 daughters and 1 son.  His son is diabetic.  Daughters are in good health.  Past Medical History:  Diagnosis Date   Allergy    GERD (gastroesophageal reflux disease)    Hard of hearing    Hypercholesteremia    Hyperlipidemia    Hypertension    Myocardial infarction ([place])    X 2 stents    Past Surgical History:  Procedure Laterality Date   CARDIAC CATHETERIZATION     X 2 stents   CHOLECYSTECTOMY N/A 05/20/2020   Procedure: LAPAROSCOPIC CHOLECYSTECTOMY;  Surgeon: [name], MD;  Location: [facility] ORS;  Service: General;  Laterality: N/A;   ERCP N/A 05/18/2020   Procedure: ENDOSCOPIC RETROGRADE CHOLANGIOPANCREATOGRAPHY (ERCP) ;  Surgeon: Rogene Houston, MD;  Location: [facility] SU[facility];  Service: Endoscopy;  Laterality: N/A;   ERCP N/A 05/20/2020   Procedure: ENDOSCOPIC RETROGRADE CHOLANGIOPANCREATOGRAPHY (ERCP);  Surgeon: [name], MD;  Location: [facility];  Service: General;  Laterality: N/A;   [place] wore cast   SPHINCTEROTOMY  05/18/2020   Procedure: SPHINCTEROTOMY mild;  Surgeon: Rogene Houston, MD;  Location: [facility];  Service: Endoscopy;;   TONSILLECTOMY     as a child    Prior to Admission medications   Medication Sig Start Date End Date Taking? Authorizing Provider  VASCEPA 1 g capsule Take 1 capsule by mouth  in the morning and at bedtime. 03/12/20   [provider]  Ascorbic Acid (VITAMIN C) 1000 MG tablet Take 1,000 mg by mouth daily.    [provider]  aspirin EC 81 MG tablet Take 1 tablet (81 mg total) by mouth daily. 11/19/16   Orlena Sheldon, PA-C  cetirizine (ZYRTEC) 10 MG tablet TAKE 1 TABLET(10 MG) BY MOUTH DAILY Patient taking differently: Take 10 mg by mouth daily. 05/23/20   Susy Frizzle, MD  cholecalciferol (VITAMIN D) 1000 units tablet Take 1 tablet (1,000 Units  total) by mouth daily. 11/19/16   Orlena Sheldon, PA-C  HYDROcodone-acetaminophen (NORCO/VICODIN) 5-325 MG tablet Take 1 tablet by mouth every 6 (six) hours as needed. 09/20/21   Susy Frizzle, MD  methocarbamol (ROBAXIN) 750 MG tablet Take 1 tablet (750 mg total) by mouth 4 (four) times daily. Patient taking differently: Take 750 mg by mouth 4 (four) times daily. As needed 05/03/19   Susy Frizzle, MD  metoprolol succinate (TOPROL-XL) 50 MG 24 hr tablet TAKE 1/2 TABLET BY MOUTH IN THE MORNING AND 1/2 TABLET BY MOUTH IN THE EVENING Patient not taking: Reported on 11/07/2021 11/19/16   Orlena Sheldon, PA-C  Multiple Vitamin (MULTIVITAMIN WITH MINERALS) TABS tablet Take 1 tablet by mouth daily. 11/19/16   Orlena Sheldon, PA-C  nitroGLYCERIN (NITROSTAT) 0.4 MG SL tablet Place 0.4 mg under the tongue every 5 (five) minutes as needed for chest pain. 02/16/20   [provider]  pantoprazole (PROTONIX) 40 MG tablet Take 40 mg by mouth daily.    [provider]  ramipril (ALTACE) 2.5 MG capsule Take 1 capsule by mouth daily. 02/16/20   [provider]  vitamin B-12 (CYANOCOBALAMIN) 50 MCG tablet Take 50 mcg by mouth daily.    [provider]    Current Facility-Administered Medications  Medication Dose Route Frequency Provider Last Rate Last Admin   amLODipine (NORVASC) tablet 5 mg  5 mg Oral Daily Adefeso, Oladapo, DO   5 mg at 11/09/21 0454   aspirin EC tablet 81 mg  81 mg Oral Daily Adefeso, Oladapo, DO   81 mg at 11/09/21 0851   cefTRIAXone (ROCEPHIN) 2 g in sodium chloride 0.9 % 100 mL IVPB  2 g Intravenous Q24H Adefeso, Oladapo, DO       enoxaparin (LOVENOX) injection 40 mg  40 mg Subcutaneous Q24H Adefeso, Oladapo, DO   40 mg at 11/08/21 2257   feeding supplement (ENSURE ENLIVE / ENSURE PLUS) liquid 237 mL  237 mL Oral TID BM Manuella Ghazi, Pratik D, DO       metroNIDAZOLE (FLAGYL) IVPB 500 mg  500 mg Intravenous Q12H Adefeso, Oladapo, DO 100 mL/hr at 11/09/21 0851 500 mg at  11/09/21 0851   multivitamin with minerals tablet 1 tablet  1 tablet Oral Daily Manuella Ghazi, Pratik D, DO       nitroGLYCERIN (NITROSTAT) SL tablet 0.4 mg  0.4 mg Sublingual Q5 min PRN Adefeso, Oladapo, DO        Allergies as of 11/08/2021   (No Known Allergies)    Family History  Problem Relation Age of Onset   Asthma Mother    Hearing loss Mother    Heart disease Father    Diabetes Brother    Diabetes Maternal Grandmother    Colon cancer Neg Hx    Colon polyps Neg Hx     Social History   Socioeconomic History   Marital status: Married    Spouse name: Not  on file   Number of children: Not on file   Years of education: Not on file   Highest education level: Not on file  Occupational History   Occupation: retired    Comment: truck driver  Tobacco Use   Smoking status: Former    Types: Cigarettes    Quit date: 09/15/1981    Years since quitting: 40.1   Smokeless tobacco: Never  Vaping Use   Vaping Use: Never used  Substance and Sexual Activity   Alcohol use: No    Alcohol/week: 0.0 standard drinks   Drug use: No   Sexual activity: Not Currently  Other Topics Concern   Not on file  Social History Narrative   Not on file   Social Determinants of Health   Financial Resource Strain: Not on file  Food Insecurity: Not on file  Transportation Needs: Not on file  Physical Activity: Not on file  Stress: Not on file  Social Connections: Not on file  Intimate Partner Violence: Not on file    Review of Systems: See HPI, otherwise normal ROS  Physical Exam: Temp:  [98 F (36.7 C)-99 F (37.2 C)] 98.1 F (36.7 C) (02/25 0459) Pulse Rate:  [66-79] 66 (02/25 0459) Resp:  [14-20] 19 (02/25 0459) BP: (127-170)/(55-79) 130/78 (02/25 0851) SpO2:  [96 %-98 %] 96 % (02/25 0459) FiO2 (%):  [21 %] 21 % (02/24 2125) Weight:  [59.9 kg-62.6 kg] 62.6 kg (02/24 2153) Last BM Date : 11/08/21  Well-developed thin Caucasian male who is in no acute distress. He has hearing  impairment. Conjunctivae is pink.  Sclera is nonicteric. Oropharyngeal mucosa is normal.  His he has upper dentures but edentulous and lower jaw. Neck without masses thyromegaly or lymphadenopathy. Cardiac exam with regular rhythm normal S1-S2.  No murmur or gallop noted. Auscultation lungs reveal vesicular breath sounds bilaterally.  No rales or rhonchi. Abdomen is symmetrical.  He has upper midline scar.  There is small hernia to his lower end of his scar.  Is completely reducible.  On palpation abdomen is soft and nontender with organomegaly or masses. Extremities are thin.  No clubbing noted.  Intake/Output from previous day: 02/24 0701 - 02/25 0700 In: 200 [IV Piggyback:200] Out: -  Intake/Output this shift: Total I/O In: 100 [P.O.:100] Out: -   Lab Results: Recent Labs    11/07/21 1421 11/08/21 1818 11/09/21 0424  WBC 5.1 4.6 4.1  HGB 11.0* 11.4* 9.9*  HCT 34.2* 35.7* 30.5*  PLT 138* 136* 119*   BMET Recent Labs    11/07/21 1421 11/08/21 1818 11/09/21 0424  NA 134* 140 140  K 3.9 3.6 3.3*  CL 104 104 107  CO2 25 28 27   GLUCOSE 131* 110* 108*  BUN 11 9 9   CREATININE 0.59* 0.59* 0.52*  CALCIUM 8.3* 8.9 8.4*   LFT Recent Labs    11/09/21 0424  PROT 6.5  ALBUMIN 2.9*  AST 123*  ALT 77*  ALKPHOS 422*  BILITOT 0.8   PT/INR No results for input(s): LABPROT, INR in the last 72 hours. Hepatitis Panel No results for input(s): HEPBSAG, HCVAB, HEPAIGM, HEPBIGM in the last 72 hours.  Studies/Results: CT ABDOMEN PELVIS W CONTRAST  Result Date: 11/07/2021 CLINICAL DATA:  Fever of unknown origin.  Positive blood cultures. EXAM: CT ABDOMEN AND PELVIS WITH CONTRAST TECHNIQUE: Multidetector CT imaging of the abdomen and pelvis was performed using the standard protocol following bolus administration of intravenous contrast. RADIATION DOSE REDUCTION: This exam was performed according to the  departmental dose-optimization program which includes automated exposure  control, adjustment of the mA and/or kV according to patient size and/or use of iterative reconstruction technique. CONTRAST:  69mL OMNIPAQUE IOHEXOL 300 MG/ML  SOLN COMPARISON:  CT abdomen pelvis 05/18/2020 FINDINGS: Lower chest: No acute abnormality. Hepatobiliary: No focal liver abnormality. Interval cholecystectomy and hepaticojejunostomy with resulting mild pneumobilia in the intrahepatic and extrahepatic bile ducts. Pancreas: Diffuse moderate parenchymal atrophy. The main pancreatic duct is not dilated. No pancreatic ductal dilatation or surrounding inflammatory changes. Spleen: Normal in size without focal abnormality. Adrenals/Urinary Tract: Adrenal glands are unremarkable. A few bilateral subcentimeter round hypodensities in the renal cortices, consistent with simple renal cysts, stable. No hydronephrosis. Mildly distended urinary bladder is unremarkable. Stomach/Bowel: Postoperative changes of Roux-en-Y hepaticojejunostomy. Anastomoses in the right upper abdomen and left mid abdomen are patent. The appendix is normal. Diverticulosis of the distal descending and sigmoid colon without findings of diverticulitis. Mild retroperitoneal fat stranding along the second portion the duodenum which demonstrates mild wall thickening. No discrete ulcer. No findings of free intraperitoneal air to suggest perforation. Vascular/Lymphatic: Aortic atherosclerosis. Enlarged 1.1 cm short axis retroperitoneal lymph node at the level of the aortic hiatus (series 2, image 24). Additional prominent node in the gastrohepatic ligament measures 0.9 cm short axis (series 2, image 22) and node at the porta hepatis measuring 1.0 cm short axis (series 2, image 27). Reproductive: Prostatomegaly. Other: Trace fluid is noted along the right lateral conal fascia. No focal fluid collection. No abdominal wall hernia or abnormality. Musculoskeletal: No acute or suspicious osseous findings. Mild leftward curvature of the lumbar spine with  multilevel advanced degenerative disc disease. IMPRESSION: 1. Interval postoperative changes of cholecystectomy and Roux-en-Y hepaticojejunostomy. No bowel obstruction. 2. Mild retroperitoneal fat stranding along the second portion of the duodenum with mild duodenal wall thickening. No discrete ulceration or findings of perforation. No focal fluid collection. Findings may represent duodenitis versus recurrent pancreatitis. Recommend correlation with labs. 3. New enlarged superior retroperitoneal lymph node, as well as other prominent nodes in the gastrohepatic ligament and at the porta hepatis, are favored to be reactive. Recommend attention on follow-up imaging. 4. Diverticulosis of the distal descending and sigmoid colon without findings of diverticulitis. 5.  Aortic Atherosclerosis (ICD10-I70.0). Electronically Signed   By: Ileana Roup M.D.   On: 11/07/2021 16:42   US Abdomen Limited  Result Date: 11/09/2021 CLINICAL DATA:  Elevated liver function test. EXAM: ULTRASOUND ABDOMEN LIMITED RIGHT UPPER QUADRANT COMPARISON:  CT, 11/07/2021. FINDINGS: Gallbladder: Surgically absent. No fluid collection within the gallbladder fossa to suggest an abscess or bile leak. Common bile duct: Diameter: 5-6 mm. Echogenic foci within intrahepatic ducts consistent with intraductal air, as noted on the recent CT. Liver: Normal liver size. Mild increased parenchymal echogenicity. No mass or focal lesion. Portal vein is patent on color Doppler imaging with normal direction of blood flow towards the liver. Other: None. IMPRESSION: 1. Status post cholecystectomy. 2. No evidence of biliary obstruction. No sonographic evidence of a bile leak or postoperative abscess. 3. Mild increased liver parenchymal echogenicity, nonspecific. No liver mass or focal lesion. Electronically Signed   By: Lajean Manes M.D.   On: 11/09/2021 10:07    Assessment;  Patient is 86 year old Caucasian male who is status post Roux-en-Y hepaticojejunostomy  in September 2021 for bile duct transection at cholecystectomy who now presents with 6 weeks history of rigors and fever.  Blood cultures drawn 4 days ago reveals E. coli and therefore patient was sent to emergency room for evaluation  and treatment.  His transaminases and alkaline phosphatase are elevated.  Bilirubin is normal.  CT reveals pneumobilia and evidence of hepaticojejunostomy.  Pancreas is normal.  He does have mild retroperitoneal stranding in the region of second part of the duodenum. Patient was begun on ceftriaxone and and metronidazole.  Organism is sensitive to ceftriaxone. 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  He could also have developed stones and intrahepatic bile ducts resulting in intermittent obstruction. He will need an ERCP which may be difficult if not impossible given Roux-en-Y configuration.  He will need services of Dr. Harley Hallmark who has expertise in advanced biliary endoscopy.  Anemia secondary to acute illness.  No evidence of overt GI bleed.  Rule out iron B12 or folate deficiency.  Recommendations;  MRCP during this admission.  Patient will need to be sedated as he had difficult time when he had last MRCP at [facility]. Continue ceftriaxone but can stop metronidazole. Would recommend continuing ceftriaxone for 4 days after which she could be transition to oral Cipro for another 1 week. Will check serum iron TIBC ferritin B12 and folate level with next blood draw. Hemoccult x1. I will contact Dr. Delrae Alfred of [facility] on 11/11/2021.     LOS: 1 day   Atanacio Melnyk  11/09/2021, 1:05 PM

## 2021-11-09 NOTE — Hospital Course (Addendum)
Per HPI: Randy Thomas is a 86 y.o. male with medical history significant of hypertension, myocardial infarction, hyperlipidemia who presents to the emergency department due to positive blood cultures.  Patient received a call from his PCP yesterday and was asked to go to the ED due to positive blood cultures.  He presented to the ED yesterday and was started on IV ceftriaxone, patient was going to be admitted, but he signed AMA and left the ED, patient states that it was because he was not mentally prepared for admission and that he is currently taking care of all he needed to take care of at home and wants to continue his treatment and that he was mentally prepared for admission at this time. Patient states that he had a complicated cholecystectomy which required transfer to [facility] in 2021 and that he has been having intermittent low-grade fevers in the range of 90-100 since then.  He denies any other symptomatology, patient denies chest pain, shortness of breath, nausea, vomiting, abdominal pain, irritative bladder symptoms.  11/09/21: Patient has been admitted for E. coli bacteremia that is likely of GI source.  Appreciate further evaluation with GI with likely need for MRCP and/or eventual transfer to [facility].  Continue IV antibiotics as ordered for now.  Patient is otherwise asymptomatic at this time.  11/10/21: Patient is overall doing well and awaiting MRCP in a.m.  Anticipate discharge to home tomorrow if overall doing well on oral antibiotics.

## 2021-11-09 NOTE — Progress Notes (Signed)
Initial Nutrition Assessment  DOCUMENTATION CODES:   Not applicable  INTERVENTION:   -MVI with minerals daily -Increase Ensure Enlive po to TID, each supplement provides 350 kcal and 20 grams of protein -Liberalize diet to regular for widest variety of meal selections  NUTRITION DIAGNOSIS:   Inadequate oral intake related to poor appetite as evidenced by meal completion < 25%.  GOAL:   Patient will meet greater than or equal to 90% of their needs  MONITOR:   PO intake, Supplement acceptance, Labs, Weight trends, Skin, I & O's  REASON FOR ASSESSMENT:   Malnutrition Screening Tool    ASSESSMENT:   Randy Thomas is a 86 y.o. male with medical history significant of hypertension, myocardial infarction, hyperlipidemia who presents to the emergency department due to positive blood cultures.  Patient received a call from his PCP yesterday and was asked to go to the ED due to positive blood cultures.  He presented to the ED yesterday and was started on IV ceftriaxone, patient was going to be admitted, but he signed AMA and left the ED, patient states that it was because he was not mentally prepared for admission and that he is currently taking care of all he needed to take care of at home and wants to continue his treatment and that he was mentally prepared for admission at this time.  Pt admitted with gram negative bacteremia and duodenitis.   Reviewed I/O's: +200 ml x 24 hours   Pt unavailable at time of visit. Attempted to speak with pt via call to hospital room phone, however, unable to reach. RD unable to obtain further nutrition-related history or complete nutrition-focused physical exam at this time.    Pt with poor oral intake on a heart healthy diet. Noted meal completions 25%. RD will liberalize diet to provide widest variety of food selections. Pt is taking Ensure supplements per MAR.   Reviewed wt hx; pt has experienced a 4.1% wt loss over the past 2 months, which is  not significant for time frame.   Medications reviewed.   Labs reviewed: K: 3.3.   Diet Order:   Diet Order             Diet Heart Room service appropriate? Yes; Fluid consistency: Thin  Diet effective now                   EDUCATION NEEDS:   No education needs have been identified at this time  Skin:  Skin Assessment: Reviewed RN Assessment  Last BM:  11/08/21  Height:   Ht Readings from Last 1 Encounters:  11/08/21 5\' 6"  (1.676 m)    Weight:   Wt Readings from Last 1 Encounters:  11/08/21 62.6 kg    Ideal Body Weight:  64.5 kg  BMI:  Body mass index is 22.28 kg/m.  Estimated Nutritional Needs:   Kcal:  1700-1900  Protein:  90-105 grams  Fluid:  > 1.7 L    Loistine Chance, RD, LDN, [place] Registered Dietitian II Certified Diabetes Care and Education Specialist Please refer to [facility] for RD and/or RD on-call/weekend/after hours pager

## 2021-11-10 DIAGNOSIS — R7401 Elevation of levels of liver transaminase levels: Secondary | ICD-10-CM | POA: Diagnosis not present

## 2021-11-10 DIAGNOSIS — R7881 Bacteremia: Secondary | ICD-10-CM | POA: Diagnosis not present

## 2021-11-10 LAB — COMPREHENSIVE METABOLIC PANEL
ALT: 76 U/L — ABNORMAL HIGH (ref 0–44)
AST: 105 U/L — ABNORMAL HIGH (ref 15–41)
Albumin: 3 g/dL — ABNORMAL LOW (ref 3.5–5.0)
Alkaline Phosphatase: 400 U/L — ABNORMAL HIGH (ref 38–126)
Anion gap: 6 (ref 5–15)
BUN: 11 mg/dL (ref 8–23)
CO2: 28 mmol/L (ref 22–32)
Calcium: 8.9 mg/dL (ref 8.9–10.3)
Chloride: 105 mmol/L (ref 98–111)
Creatinine, Ser: 0.52 mg/dL — ABNORMAL LOW (ref 0.61–1.24)
GFR, Estimated: >60 mL/min (ref >60)
Glucose, Bld: 100 mg/dL — ABNORMAL HIGH (ref 70–99)
Potassium: 4.1 mmol/L (ref 3.5–5.1)
Sodium: 139 mmol/L (ref 135–145)
Total Bilirubin: 0.8 mg/dL (ref 0.3–1.2)
Total Protein: 6.7 g/dL (ref 6.5–8.1)

## 2021-11-10 LAB — IRON AND TIBC
Iron: 79 ug/dL (ref 45–182)
Saturation Ratios: 20 % (ref 17.9–39.5)
TIBC: 387 ug/dL (ref 250–450)
UIBC: 308 ug/dL

## 2021-11-10 LAB — CBC
HCT: 31.2 % — ABNORMAL LOW (ref 39.0–52.0)
Hemoglobin: 10 g/dL — ABNORMAL LOW (ref 13.0–17.0)
MCH: 27.9 pg (ref 26.0–34.0)
MCHC: 32.1 g/dL (ref 30.0–36.0)
MCV: 87.2 fL (ref 80.0–100.0)
Platelets: 122 10*3/uL — ABNORMAL LOW (ref 150–400)
RBC: 3.58 MIL/uL — ABNORMAL LOW (ref 4.22–5.81)
RDW: 15.9 % — ABNORMAL HIGH (ref 11.5–15.5)
WBC: 4 10*3/uL (ref 4.0–10.5)
nRBC: 0 % (ref 0.0–0.2)

## 2021-11-10 LAB — VITAMIN B12: Vitamin B-12: 839 pg/mL (ref 180–914)

## 2021-11-10 LAB — FERRITIN: Ferritin: 20 ng/mL — ABNORMAL LOW (ref 24–336)

## 2021-11-10 LAB — MAGNESIUM: Magnesium: 2 mg/dL (ref 1.7–2.4)

## 2021-11-10 LAB — FOLATE: Folate: 13.5 ng/mL (ref >5.9)

## 2021-11-10 MED ORDER — ACETAMINOPHEN 325 MG PO TABS
650.0000 mg | ORAL_TABLET | Freq: Four times a day (QID) | ORAL | Status: DC | PRN
Start: 2021-11-10 — End: 2021-11-11

## 2021-11-10 NOTE — Progress Notes (Signed)
°Subjective: ° °Patient has no complaints.  His appetite is good.  He states he had formed stool this morning.  No melena or rectal bleeding.  He denies chest pain shortness of breath or abdominal pain. ° °Current Medications: ° °Current Facility-Administered Medications:  °  acetaminophen (TYLENOL) tablet 650 mg, 650 mg, Oral, Q6H PRN, Adefeso, Oladapo, DO °  amLODipine (NORVASC) tablet 5 mg, 5 mg, Oral, Daily, Adefeso, Oladapo, DO, 5 mg at 11/10/21 0820 °  aspirin EC tablet 81 mg, 81 mg, Oral, Daily, Adefeso, Oladapo, DO, 81 mg at 11/10/21 0820 °  cefTRIAXone (ROCEPHIN) 2 g in sodium chloride 0.9 % 100 mL IVPB, 2 g, Intravenous, Q24H, Adefeso, Oladapo, DO, Last Rate: 200 mL/hr at 11/09/21 1805, 2 g at 11/09/21 1805 °  enoxaparin (LOVENOX) injection 40 mg, 40 mg, Subcutaneous, Q24H, Adefeso, Oladapo, DO, 40 mg at 11/09/21 2115 °  feeding supplement (ENSURE ENLIVE / ENSURE PLUS) liquid 237 mL, 237 mL, Oral, TID BM, Shah, Pratik D, DO, 237 mL at 11/10/21 0826 °  metroNIDAZOLE (FLAGYL) IVPB 500 mg, 500 mg, Intravenous, Q12H, Adefeso, Oladapo, DO, Last Rate: 100 mL/hr at 11/10/21 0821, 500 mg at 11/10/21 0821 °  multivitamin with minerals tablet 1 tablet, 1 tablet, Oral, Daily, Shah, Pratik D, DO, 1 tablet at 11/10/21 0825 °  nitroGLYCERIN (NITROSTAT) SL tablet 0.4 mg, 0.4 mg, Sublingual, Q5 min PRN, Adefeso, Oladapo, DO  ° °Objective: °Blood pressure (!) 148/62, pulse 66, temperature 98.2 °F (36.8 °C), temperature source Oral, resp. rate 19, height 5' 6" (1.676 m), weight 62.6 kg, SpO2 95 %. °Patient is alert and in no acute distress. ° ° °Labs/studies Results: ° ° °CBC Latest Ref Rng & Units 11/10/2021 11/09/2021 11/08/2021  °WBC 4.0 - 10.5 K/uL 4.0 4.1 4.6  °Hemoglobin 13.0 - 17.0 g/dL 10.0(L) 9.9(L) 11.4(L)  °Hematocrit 39.0 - 52.0 % 31.2(L) 30.5(L) 35.7(L)  °Platelets 150 - 400 K/uL 122(L) 119(L) 136(L)  °  °CMP Latest Ref Rng & Units 11/10/2021 11/09/2021 11/08/2021  °Glucose 70 - 99 mg/dL 100(H) 108(H) 110(H)  °BUN 8  - 23 mg/dL 11 9 9  °Creatinine 0.61 - 1.24 mg/dL 0.52(L) 0.52(L) 0.59(L)  °Sodium 135 - 145 mmol/L 139 140 140  °Potassium 3.5 - 5.1 mmol/L 4.1 3.3(L) 3.6  °Chloride 98 - 111 mmol/L 105 107 104  °CO2 22 - 32 mmol/L 28 27 28  °Calcium 8.9 - 10.3 mg/dL 8.9 8.4(L) 8.9  °Total Protein 6.5 - 8.1 g/dL 6.7 6.5 -  °Total Bilirubin 0.3 - 1.2 mg/dL 0.8 0.8 -  °Alkaline Phos 38 - 126 U/L 400(H) 422(H) -  °AST 15 - 41 U/L 105(H) 123(H) -  °ALT 0 - 44 U/L 76(H) 77(H) -  °  °Hepatic Function Latest Ref Rng & Units 11/10/2021 11/09/2021 11/07/2021  °Total Protein 6.5 - 8.1 g/dL 6.7 6.5 7.6  °Albumin 3.5 - 5.0 g/dL 3.0(L) 2.9(L) 3.5  °AST 15 - 41 U/L 105(H) 123(H) 151(H)  °ALT 0 - 44 U/L 76(H) 77(H) 89(H)  °Alk Phosphatase 38 - 126 U/L 400(H) 422(H) 490(H)  °Total Bilirubin 0.3 - 1.2 mg/dL 0.8 0.8 1.2  °  °Hemoccults pending. ° °Serum folate 13.5 °Serum B12 level 839 °Serum iron 79, TIBC 387 and saturation 20% °Serum ferritin 20 ° °Assessment: ° °#1.  Acute cholangitis secondary to E. coli most likely hepaticojejunostomy stricture.  He could also have stones proximal to anastomosis.  He is on ceftriaxone.  He remains afebrile and has not had rigors anymore. ° °#2.  Anemia.    Anemia.  H&H is stable.  B12 and folate levels are normal.  Iron studies are normal except ferritin 20.  He probably has anemia of chronic disease.  Hemoccult is pending.  Plan:  MRCP in a.m. Patient should be able to go home after MRCP tomorrow. He can be transitioned to oral antibiotic at the time of discharge. We will treat him for another 1 week. I will contact Dr. Bailey Mech and Dr. Harley Hallmark of [facility] tomorrow.

## 2021-11-10 NOTE — Progress Notes (Signed)
PROGRESS NOTE    SCHNEIDER WARCHOL  WJX:[id number] DOB: 08-Apr-1936 DOA: 11/08/2021 PCP: Susy Frizzle, MD   Brief Narrative:  Per HPI: Randy Thomas is a 86 y.o. male with medical history significant of hypertension, myocardial infarction, hyperlipidemia who presents to the emergency department due to positive blood cultures.  Patient received a call from his PCP yesterday and was asked to go to the ED due to positive blood cultures.  He presented to the ED yesterday and was started on IV ceftriaxone, patient was going to be admitted, but he signed AMA and left the ED, patient states that it was because he was not mentally prepared for admission and that he is currently taking care of all he needed to take care of at home and wants to continue his treatment and that he was mentally prepared for admission at this time. Patient states that he had a complicated cholecystectomy which required transfer to [facility] in 2021 and that he has been having intermittent low-grade fevers in the range of 90-100 since then.  He denies any other symptomatology, patient denies chest pain, shortness of breath, nausea, vomiting, abdominal pain, irritative bladder symptoms.  11/09/21: Patient has been admitted for E. coli bacteremia that is likely of GI source.  Appreciate further evaluation with GI with likely need for MRCP and/or eventual transfer to [facility].  Continue IV antibiotics as ordered for now.  Patient is otherwise asymptomatic at this time.  11/10/21: Patient is overall doing well and awaiting MRCP in a.m.  Anticipate discharge to home tomorrow if overall doing well on oral antibiotics.    Assessment & Plan:   Principal Problem:   Gram-negative bacteremia Active Problems:   Coronary artery disease   HTN (hypertension)   Transaminitis   Thrombocytopenia (HCC)   Duodenitis  Assessment and Plan: Acute cholangitis with E. coli bacteremia likely related to hepaticojejunostomy  stricture Patient reports several months of intermittent low-grade fevers Patient presents with transaminitis, but lipase level was normal Urinalysis was negative Patient's bacteremia may be related to his history of complicated gallbladder CT abdomen and pelvis showed mild retroperitoneal fat stranding, findings may represent duodenitis versus recurrent pancreatitis.  Enlarged superior retroperitoneal lymph node favored to be reactive. Gastroenterology consult appreciated Hold ID consultation for now as source of infection is being evaluated by GI Continue IV ceftriaxone and Flagyl Repeat blood culture pending MRCP in a.m. with likely discharge in a.m. on ciprofloxacin 1 week  Anemia of chronic disease Appreciate anemia panel work-up by GI Hemoccult pending    Thrombocytopenia (HCC) Platelets 136, this is possibly reactive Continue to monitor platelet levels with morning labs   Transaminitis-downtrending Continue monitoring Patient denies any abdominal pain Outpatient hepatitis Panel done on 2/21 was nonreactive Outpatient sed rate done on 2/21 was 56 CT abdomen was suggestive of enlarged superior retroperitoneal lymph node favored to be reactive. Patient is postop cholecystectomy and Roux-en-Y hepaticojejunostomy RUQ ultrasound without any acute findings   HTN (hypertension)- (present on admission) Continue amlodipine 5 mg p.o. daily   Coronary artery disease- (present on admission) Patient denies any chest pain Continue aspirin, nitroglycerin as needed     DVT prophylaxis: Lovenox Code Status: Full Family Communication: Daughter at bedside 2/25 Disposition Plan:  Status is: Inpatient Remains inpatient appropriate because: Continues to require IV antibiotics and further monitoring.   Consultants:  GI   Procedures:  See below  Antimicrobials:  Anti-infectives (From admission, onward)    Start     Dose/Rate  Route Frequency Ordered Stop   11/09/21 1800   cefTRIAXone (ROCEPHIN) 2 g in sodium chloride 0.9 % 100 mL IVPB        2 g 200 mL/hr over 30 Minutes Intravenous Every 24 hours 11/08/21 2123     11/08/21 2030  metroNIDAZOLE (FLAGYL) IVPB 500 mg        500 mg 100 mL/hr over 60 Minutes Intravenous Every 12 hours 11/08/21 2015     11/08/21 1815  cefTRIAXone (ROCEPHIN) 2 g in sodium chloride 0.9 % 100 mL IVPB        2 g 200 mL/hr over 30 Minutes Intravenous  Once 11/08/21 1810 11/08/21 1915       Subjective: Patient seen and evaluated today with no new acute complaints or concerns. No acute concerns or events noted overnight.  Objective: Vitals:   11/09/21 0851 11/09/21 1401 11/09/21 2027 11/10/21 0506  BP: 130/78 114/61 132/60 (!) 148/62  Pulse:  78 71 66  Resp:  18 19 19   Temp:  97.6 F (36.4 C) 98.2 F (36.8 C) 98.2 F (36.8 C)  TempSrc:   Oral Oral  SpO2:  98% 97% 95%  Weight:      Height:        Intake/Output Summary (Last 24 hours) at 11/10/2021 1306 Last data filed at 11/10/2021 0900 Gross per 24 hour  Intake 720 ml  Output --  Net 720 ml   Filed Weights   11/08/21 1640 11/08/21 2153  Weight: 59.9 kg 62.6 kg    Examination:  General exam: Appears calm and comfortable  Respiratory system: Clear to auscultation. Respiratory effort normal. Cardiovascular system: S1 & S2 heard, RRR.  Gastrointestinal system: Abdomen is soft Central nervous system: Alert and awake Extremities: No edema Skin: No significant lesions noted Psychiatry: Flat affect.    Data Reviewed: I have personally reviewed following labs and imaging studies  CBC: Recent Labs  Lab 11/05/21 1435 11/07/21 1421 11/08/21 1818 11/09/21 0424 11/10/21 0448  WBC 5.9 5.1 4.6 4.1 4.0  NEUTROABS 3,711 3.2 3.0  --   --   HGB 10.8* 11.0* 11.4* 9.9* 10.0*  HCT 33.6* 34.2* 35.7* 30.5* 31.2*  MCV 86.4 88.8 88.1 87.1 87.2  PLT 154 138* 136* 119* 546*   Basic Metabolic Panel: Recent Labs  Lab 11/05/21 1435 11/07/21 1421 11/08/21 1818  11/09/21 0424 11/10/21 0448  NA 140 134* 140 140 139  K 4.0 3.9 3.6 3.3* 4.1  CL 106 104 104 107 105  CO2 23 25 28 27 28   GLUCOSE 99 131* 110* 108* 100*  BUN 11 11 9 9 11   CREATININE 0.67* 0.59* 0.59* 0.52* 0.52*  CALCIUM 8.2* 8.3* 8.9 8.4* 8.9  MG  --   --   --  1.9 2.0  PHOS  --   --   --  3.2  --    GFR: Estimated Creatinine Clearance: 61.9 mL/min (A) (by C-G formula based on SCr of 0.52 mg/dL (L)). Liver Function Tests: Recent Labs  Lab 11/05/21 1435 11/07/21 1421 11/09/21 0424 11/10/21 0448  AST 135* 151* 123* 105*  ALT 86* 89* 77* 76*  ALKPHOS  --  490* 422* 400*  BILITOT 1.2 1.2 0.8 0.8  PROT 6.7 7.6 6.5 6.7  ALBUMIN  --  3.5 2.9* 3.0*   Recent Labs  Lab 11/07/21 1421  LIPASE 34   No results for input(s): AMMONIA in the last 168 hours. Coagulation Profile: No results for input(s): INR, PROTIME in the last 168 hours. Cardiac  Enzymes: No results for input(s): CKTOTAL, CKMB, CKMBINDEX, TROPONINI in the last 168 hours. BNP (last 3 results) No results for input(s): PROBNP in the last 8760 hours. HbA1C: No results for input(s): HGBA1C in the last 72 hours. CBG: No results for input(s): GLUCAP in the last 168 hours. Lipid Profile: No results for input(s): CHOL, HDL, LDLCALC, TRIG, CHOLHDL, LDLDIRECT in the last 72 hours. Thyroid Function Tests: No results for input(s): TSH, T4TOTAL, FREET4, T3FREE, THYROIDAB in the last 72 hours. Anemia Panel: Recent Labs    11/10/21 0448  VITAMINB12 839  FOLATE 13.5  FERRITIN 20*  TIBC 387  IRON 79   Sepsis Labs: No results for input(s): PROCALCITON, LATICACIDVEN in the last 168 hours.  Recent Results (from the past 240 hour(s))  Culture, blood (single) w Reflex to ID Panel     Status: Abnormal   Collection Time: 11/05/21  2:36 PM   Specimen: Blood  Result Value Ref Range Status   MICRO NUMBER: [id number]  Final   SPECIMEN QUALITY: Adequate  Final   Source L AC  Final   STATUS: FINAL  Final   ISOLATE 1:  Escherichia coli (AA)  Final    Comment: Escherichia coli , from aerobic and anaerobic bottles   COMMENT: Aerobic and anaerobic bottle received.  Final      Susceptibility   Escherichia coli - BLOOD CULTURE, NEGATIVE 1    AMOX/CLAVULANIC <=2 Sensitive     AMPICILLIN <=2 Sensitive     AMPICILLIN/SULBACTAM <=2 Sensitive     CEFAZOLIN* <=4 Not Reportable      * For infections other than uncomplicated UTI caused by E. coli, K. pneumoniae or P. mirabilis: Cefazolin is resistant if MIC > or = 8 mcg/mL. (Distinguishing susceptible versus intermediate for isolates with MIC < or = 4 mcg/mL requires additional testing.)     CEFTAZIDIME <=1 Sensitive     CEFEPIME <=1 Sensitive     CEFTRIAXONE <=1 Sensitive     CIPROFLOXACIN <=0.25 Sensitive     LEVOFLOXACIN <=0.12 Sensitive     GENTAMICIN <=1 Sensitive     IMIPENEM <=0.25 Sensitive     PIP/TAZO <=4 Sensitive     TOBRAMYCIN <=1 Sensitive     TRIMETH/SULFA* <=20 Sensitive      * For infections other than uncomplicated UTI caused by E. coli, K. pneumoniae or P. mirabilis: Cefazolin is resistant if MIC > or = 8 mcg/mL. (Distinguishing susceptible versus intermediate for isolates with MIC < or = 4 mcg/mL requires additional testing.) Legend: S = Susceptible  I = Intermediate R = Resistant  NS = Not susceptible * = Not tested  NR = Not reported **NN = See antimicrobic comments   Malaria Smear     Status: None   Collection Time: 11/05/21  2:36 PM   Specimen: Blood  Result Value Ref Range Status   Malaria/Babesia/Other Parasites  NEGATIVE Final    Comment: NEGATIVE HOWEVER, DUE TO THE CYCLICAL SHED RATES OF THESE PARASITES, ONE NEGATIVE SPECIMEN DOES NOT RULE OUT THE POSSIBILITY OF A PARASITIC INFECTION. OBTAIN SPECIMENS AT 6-HOUR INTERVALS FOR 36 HOURS FOR A COMPREHENSIVE EXAMINATION.   Resp Panel by RT-PCR (Flu A&B, Covid) Nasopharyngeal Swab     Status: None   Collection Time: 11/07/21 11:20 PM   Specimen: Nasopharyngeal Swab;  Nasopharyngeal(NP) swabs in vial transport medium  Result Value Ref Range Status   SARS Coronavirus 2 by RT PCR NEGATIVE NEGATIVE Final    Comment: (NOTE) SARS-CoV-2 target nucleic acids are NOT DETECTED.  The SARS-CoV-2 RNA is generally detectable in upper respiratory specimens during the acute phase of infection. The lowest concentration of SARS-CoV-2 viral copies this assay can detect is 138 copies/mL. A negative result does not preclude SARS-Cov-2 infection and should not be used as the sole basis for treatment or other patient management decisions. A negative result may occur with  improper specimen collection/handling, submission of specimen other than nasopharyngeal swab, presence of viral mutation(s) within the areas targeted by this assay, and inadequate number of viral copies(<138 copies/mL). A negative result must be combined with clinical observations, patient history, and epidemiological information. The expected result is Negative.  Fact Sheet for Patients:  EntrepreneurPulse.com.au  Fact Sheet for Healthcare Providers:  IncredibleEmployment.be  This test is no t yet approved or cleared by the [place] FDA and  has been authorized for detection and/or diagnosis of SARS-CoV-2 by FDA under an Emergency Use Authorization (EUA). This EUA will remain  in effect (meaning this test can be used) for the duration of the COVID-19 declaration under Section 564(b)(1) of the Act, 21 U.S.C.section 360bbb-3(b)(1), unless the authorization is terminated  or revoked sooner.       Influenza A by PCR NEGATIVE NEGATIVE Final   Influenza B by PCR NEGATIVE NEGATIVE Final    Comment: (NOTE) The Xpert Xpress SARS-CoV-2/FLU/RSV plus assay is intended as an aid in the diagnosis of influenza from Nasopharyngeal swab specimens and should not be used as a sole basis for treatment. Nasal washings and aspirates are unacceptable for Xpert Xpress  SARS-CoV-2/FLU/RSV testing.  Fact Sheet for Patients: EntrepreneurPulse.com.au  Fact Sheet for Healthcare Providers: IncredibleEmployment.be  This test is not yet approved or cleared by the [place] FDA and has been authorized for detection and/or diagnosis of SARS-CoV-2 by FDA under an Emergency Use Authorization (EUA). This EUA will remain in effect (meaning this test can be used) for the duration of the COVID-19 declaration under Section 564(b)(1) of the Act, 21 U.S.C. section 360bbb-3(b)(1), unless the authorization is terminated or revoked.  Performed at [facility], [address], [place], [place] [address]   Blood culture (routine x 2)     Status: None (Preliminary result)   Collection Time: 11/08/21  6:19 PM   Specimen: Right Antecubital; Blood  Result Value Ref Range Status   Specimen Description RIGHT ANTECUBITAL  Final   Special Requests   Final    BOTTLES DRAWN AEROBIC AND ANAEROBIC Blood Culture adequate volume   Culture   Final    NO GROWTH < 12 HOURS Performed at [facility], [address] [place], [place] [address]    Report Status PENDING  Incomplete  Blood culture (routine x 2)     Status: None (Preliminary result)   Collection Time: 11/08/21  6:31 PM   Specimen: BLOOD LEFT ARM  Result Value Ref Range Status   Specimen Description BLOOD LEFT ARM  Final   Special Requests   Final    BOTTLES DRAWN AEROBIC AND ANAEROBIC Blood Culture adequate volume   Culture   Final    NO GROWTH < 12 HOURS Performed at [facility], [address], [place],  [address]    Report Status PENDING  Incomplete         Radiology Studies: US Abdomen Limited  Result Date: 11/09/2021 CLINICAL DATA:  Elevated liver function test. EXAM: ULTRASOUND ABDOMEN LIMITED RIGHT UPPER QUADRANT COMPARISON:  CT, 11/07/2021. FINDINGS: Gallbladder: Surgically absent. No fluid collection within the gallbladder fossa to suggest an abscess or  bile leak. Common bile duct: Diameter: 5-6 mm. Echogenic foci within intrahepatic ducts consistent with intraductal air, as noted on the recent CT. Liver: Normal liver size. Mild increased parenchymal echogenicity. No mass or focal lesion. Portal vein is patent on color Doppler imaging with normal direction of blood flow towards the liver. Other: None. IMPRESSION: 1. Status post cholecystectomy. 2. No evidence of biliary obstruction. No sonographic evidence of a bile leak or postoperative abscess. 3. Mild increased liver parenchymal echogenicity, nonspecific. No liver mass or focal lesion. Electronically Signed   By: Lajean Manes M.D.   On: 11/09/2021 10:07        Scheduled Meds:  amLODipine  5 mg Oral Daily   aspirin EC  81 mg Oral Daily   enoxaparin (LOVENOX) injection  40 mg Subcutaneous Q24H   feeding supplement  237 mL Oral TID BM   multivitamin with minerals  1 tablet Oral Daily   Continuous Infusions:  cefTRIAXone (ROCEPHIN)  IV 2 g (11/09/21 1805)   metronidazole 500 mg (11/10/21 0821)     LOS: 2 days    Time spent: 35 minutes    Syniah Berne Darleen Crocker, DO [facility]  If 7PM-7AM, please contact night-coverage [email or web address] 11/10/2021, 1:06 PM

## 2021-11-10 NOTE — TOC Progression Note (Signed)
Transition of Care [facility]) - Progression Note    Patient Details  Name: Randy Thomas MRN: [id number] Date of Birth: 05-22-1936  Transition [facility] CM/SW Contact  Salome Arnt, [place] Phone Number: 11/10/2021, 8:48 AM  Clinical Narrative:  [place] notification completed. Notification ID: [id number].          Expected Discharge Plan and Services                                                 Social Determinants of Health (SDOH) Interventions    Readmission Risk Interventions No flowsheet data found.

## 2021-11-11 ENCOUNTER — Inpatient Hospital Stay (HOSPITAL_COMMUNITY): Payer: No Typology Code available for payment source

## 2021-11-11 ENCOUNTER — Telehealth: Payer: Self-pay | Admitting: Gastroenterology

## 2021-11-11 DIAGNOSIS — R7881 Bacteremia: Secondary | ICD-10-CM | POA: Diagnosis not present

## 2021-11-11 LAB — HEPATIC FUNCTION PANEL
ALT: 74 U/L — ABNORMAL HIGH (ref 0–44)
AST: 101 U/L — ABNORMAL HIGH (ref 15–41)
Albumin: 3.1 g/dL — ABNORMAL LOW (ref 3.5–5.0)
Alkaline Phosphatase: 390 U/L — ABNORMAL HIGH (ref 38–126)
Bilirubin, Direct: 0.2 mg/dL (ref 0.0–0.2)
Indirect Bilirubin: 0.6 mg/dL (ref 0.3–0.9)
Total Bilirubin: 0.8 mg/dL (ref 0.3–1.2)
Total Protein: 7 g/dL (ref 6.5–8.1)

## 2021-11-11 LAB — OCCULT BLOOD X 1 CARD TO LAB, STOOL: Fecal Occult Bld: POSITIVE — AB

## 2021-11-11 MED ORDER — LORAZEPAM 2 MG/ML IJ SOLN
1.0000 mg | Freq: Once | INTRAMUSCULAR | Status: AC
  Administered 2021-11-11: 1 mg via INTRAVENOUS
  Filled 2021-11-11: qty 1

## 2021-11-11 MED ORDER — CIPROFLOXACIN HCL 500 MG PO TABS: 500.0000 mg | ORAL_TABLET | Freq: Two times a day (BID) | ORAL | 0 refills | Status: AC

## 2021-11-11 MED ORDER — GADOBUTROL 1 MMOL/ML IV SOLN
7.0000 mL | Freq: Once | INTRAVENOUS | Status: AC | PRN
  Administered 2021-11-11: 7 mL via INTRAVENOUS

## 2021-11-11 NOTE — Discharge Summary (Signed)
Physician Discharge Summary  [name] HYW:[id number] DOB: Feb 08, 1936 DOA: 11/08/2021  PCP: Susy Frizzle, MD  Admit date: 11/08/2021  Discharge date: 11/11/2021  Admitted From:Home  Disposition:  Home  Recommendations for Outpatient Follow-up:  Follow up with PCP in 1-2 weeks Follow-up with GI outpatient which will be scheduled Continue on ciprofloxacin for 7 days as prescribed Continue on other home medications as prior  Home Health: None  Equipment/Devices: None  Discharge Condition:Stable  CODE STATUS: Full  Diet recommendation: Heart Healthy  Brief/Interim Summary: Per HPI: Randy Thomas is a 86 y.o. male with medical history significant of hypertension, myocardial infarction, hyperlipidemia who presents to the emergency department due to positive blood cultures.  Patient received a call from his PCP yesterday and was asked to go to the ED due to positive blood cultures.  He presented to the ED yesterday and was started on IV ceftriaxone, patient was going to be admitted, but he signed AMA and left the ED, patient states that it was because he was not mentally prepared for admission and that he is currently taking care of all he needed to take care of at home and wants to continue his treatment and that he was mentally prepared for admission at this time. Patient states that he had a complicated cholecystectomy which required transfer to [facility] in 2021 and that he has been having intermittent low-grade fevers in the range of 90-100 since then.  He denies any other symptomatology, patient denies chest pain, shortness of breath, nausea, vomiting, abdominal pain, irritative bladder symptoms.   11/09/21: Patient has been admitted for E. coli bacteremia that is likely of GI source.  Appreciate further evaluation with GI with likely need for MRCP and/or eventual transfer to [facility].  Continue IV antibiotics as ordered for now.  Patient is otherwise asymptomatic  at this time.   11/10/21: Patient is overall doing well and awaiting MRCP in a.m.  Anticipate discharge to home tomorrow if overall doing well on oral antibiotics.   11/11/2021: MRCP with no significant findings noted and reviewed with GI.  He is stable for discharge on ciprofloxacin for 1 week with outpatient follow-up which will be scheduled.  No other acute events noted throughout the course of this admission.  He is noted to have chronic anemia with Hemoccult positive that will be followed up with GI outpatient.  Discharge Diagnoses:  Principal Problem:   Gram-negative bacteremia Active Problems:   Coronary artery disease   HTN (hypertension)   Transaminitis   Thrombocytopenia (HCC)   Duodenitis  Principal discharge diagnosis: Acute cholangitis with E. 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  Discharge Instructions  Discharge Instructions     Diet - low sodium heart healthy   Complete by: As directed    Increase activity slowly   Complete by: As directed       Allergies as of 11/11/2021   No Known Allergies      Medication List     TAKE these medications    aspirin EC 81 MG tablet Take 1 tablet (81 mg total) by mouth daily.   b complex vitamins capsule Take 1 capsule by mouth daily.   cetirizine 10 MG tablet Commonly known as: ZYRTEC TAKE 1 TABLET(10 MG) BY MOUTH DAILY What changed: See the new instructions.   cholecalciferol 1000 units tablet Commonly known as: VITAMIN D Take 1 tablet (1,000 Units total) by mouth daily.   ciprofloxacin 500 MG tablet Commonly known as:  Cipro Take 1 tablet (500 mg total) by mouth 2 (two) times daily for 7 days.   HYDROcodone-acetaminophen 5-325 MG tablet Commonly known as: NORCO/VICODIN Take 1 tablet by mouth every 6 (six) hours as needed.   lansoprazole 30 MG capsule Commonly known as: PREVACID Take 30 mg by mouth daily at 12 noon.   methocarbamol 750 MG tablet Commonly known as:  ROBAXIN Take 1 tablet (750 mg total) by mouth 4 (four) times daily. What changed:  when to take this additional instructions   metoprolol succinate 50 MG 24 hr tablet Commonly known as: TOPROL-XL TAKE 1/2 TABLET BY MOUTH IN THE MORNING AND 1/2 TABLET BY MOUTH IN THE EVENING What changed:  how much to take when to take this additional instructions   multivitamin with minerals Tabs tablet Take 1 tablet by mouth daily.   nitroGLYCERIN 0.4 MG SL tablet Commonly known as: NITROSTAT Place 0.4 mg under the tongue every 5 (five) minutes as needed for chest pain.   pantoprazole 40 MG tablet Commonly known as: PROTONIX Take 40 mg by mouth daily.   rosuvastatin 20 MG tablet Commonly known as: CRESTOR Take 20 mg by mouth every evening.   Vascepa 1 g capsule Generic drug: icosapent Ethyl Take 1 capsule by mouth in the morning and at bedtime.   vitamin B-12 50 MCG tablet Commonly known as: CYANOCOBALAMIN Take 50 mcg by mouth daily.   vitamin C 1000 MG tablet Take 1,000 mg by mouth daily.        Follow-up Information     Susy Frizzle, MD. Schedule an appointment as soon as possible for a visit in 1 week(s).   Specialty: Family Medicine Contact information: [address] [place] [address] [place]. Go to.   Contact information: [address] [place] [phone]               No Known Allergies  Consultations: GI   Procedures/Studies: DG Chest 2 View  Result Date: 11/06/2021 CLINICAL DATA:  Fever of unknown origin.  No chest complaints. EXAM: CHEST - 2 VIEW COMPARISON:  None. FINDINGS: The heart size and mediastinal contours are within normal limits. Both lungs are clear. The visualized skeletal structures are unremarkable. IMPRESSION: No active cardiopulmonary disease. Electronically Signed   By: Dorise Bullion III M.D.   On: 11/06/2021 18:19   CT ABDOMEN  PELVIS W CONTRAST  Result Date: 11/07/2021 CLINICAL DATA:  Fever of unknown origin.  Positive blood cultures. EXAM: CT ABDOMEN AND PELVIS WITH CONTRAST TECHNIQUE: Multidetector CT imaging of the abdomen and pelvis was performed using the standard protocol following bolus administration of intravenous contrast. RADIATION DOSE REDUCTION: This exam was performed according to the departmental dose-optimization program which includes automated exposure control, adjustment of the mA and/or kV according to patient size and/or use of iterative reconstruction technique. CONTRAST:  49mL OMNIPAQUE IOHEXOL 300 MG/ML  SOLN COMPARISON:  CT abdomen pelvis 05/18/2020 FINDINGS: Lower chest: No acute abnormality. Hepatobiliary: No focal liver abnormality. Interval cholecystectomy and hepaticojejunostomy with resulting mild pneumobilia in the intrahepatic and extrahepatic bile ducts. Pancreas: Diffuse moderate parenchymal atrophy. The main pancreatic duct is not dilated. No pancreatic ductal dilatation or surrounding inflammatory changes. Spleen: Normal in size without focal abnormality. Adrenals/Urinary Tract: Adrenal glands are unremarkable. A few bilateral subcentimeter round hypodensities in the renal cortices, consistent with simple renal cysts, stable. No hydronephrosis. Mildly distended urinary bladder is unremarkable. Stomach/Bowel:  Postoperative changes of Roux-en-Y hepaticojejunostomy. Anastomoses in the right upper abdomen and left mid abdomen are patent. The appendix is normal. Diverticulosis of the distal descending and sigmoid colon without findings of diverticulitis. Mild retroperitoneal fat stranding along the second portion the duodenum which demonstrates mild wall thickening. No discrete ulcer. No findings of free intraperitoneal air to suggest perforation. Vascular/Lymphatic: Aortic atherosclerosis. Enlarged 1.1 cm short axis retroperitoneal lymph node at the level of the aortic hiatus (series 2, image 24).  Additional prominent node in the gastrohepatic ligament measures 0.9 cm short axis (series 2, image 22) and node at the porta hepatis measuring 1.0 cm short axis (series 2, image 27). Reproductive: Prostatomegaly. Other: Trace fluid is noted along the right lateral conal fascia. No focal fluid collection. No abdominal wall hernia or abnormality. Musculoskeletal: No acute or suspicious osseous findings. Mild leftward curvature of the lumbar spine with multilevel advanced degenerative disc disease. IMPRESSION: 1. Interval postoperative changes of cholecystectomy and Roux-en-Y hepaticojejunostomy. No bowel obstruction. 2. Mild retroperitoneal fat stranding along the second portion of the duodenum with mild duodenal wall thickening. No discrete ulceration or findings of perforation. No focal fluid collection. Findings may represent duodenitis versus recurrent pancreatitis. Recommend correlation with labs. 3. New enlarged superior retroperitoneal lymph node, as well as other prominent nodes in the gastrohepatic ligament and at the porta hepatis, are favored to be reactive. Recommend attention on follow-up imaging. 4. Diverticulosis of the distal descending and sigmoid colon without findings of diverticulitis. 5.  Aortic Atherosclerosis (ICD10-I70.0). Electronically Signed   By: Ileana Roup M.D.   On: 11/07/2021 16:42   MR 3D Recon At Scanner  Result Date: 11/11/2021 CLINICAL DATA:  Upper abdominal pain.  Status post cholecystectomy. EXAM: MRI ABDOMEN WITHOUT AND WITH CONTRAST (INCLUDING MRCP) TECHNIQUE: Multiplanar multisequence MR imaging of the abdomen was performed both before and after the administration of intravenous contrast. Heavily T2-weighted images of the biliary and pancreatic ducts were obtained, and three-dimensional MRCP images were rendered by post processing. CONTRAST:  21mL GADAVIST GADOBUTROL 1 MMOL/ML IV SOLN COMPARISON:  CT AP 11/07/2021 and MRI abdomen 05/18/2020. FINDINGS: Lower chest: No acute  findings. Hepatobiliary: There is a borderline enlarged right cardio phrenic angle lymph node measuring 1.1 cm, image 11/6. On study from 05/18/2020 this measured 0.8 cm. No focal enhancing liver abnormalities identified. Previous cholecystectomy. No intrahepatic or common bile duct dilatation. Pancreas: No pancreatic inflammation or main duct dilatation. New small cystic structure within the junction between the neck and body of pancreas measures 4 mm. Spleen:  Within normal limits in size and appearance. Adrenals/Urinary Tract: Normal adrenal glands. No hydronephrosis identified bilaterally. Cystic structure within the lateral cortex of the interpolar right kidney measures 6 mm, image 28/6. This is technically too small to reliably characterize. Stomach/Bowel: Visualized portions within the abdomen are unremarkable. Vascular/Lymphatic: No aneurysm. Aortic atherosclerosis. As noted on 11/07/2021 there are multiple, borderline enlarged upper abdominal lymph nodes. The index porta hepatic lymph node is stable measuring 1 cm, image 20/6. The index gastrohepatic ligament lymph node measures 0.9 cm, image 77/6. Also unchanged. Right retroperitoneal lymph node is unchanged measuring 1 cm, image 19/6. Other: Similar appearance of soft tissue stranding within the small bowel mesentery with trace fluid extending into the right retroperitoneal space, image 35/6. Musculoskeletal: No suspicious bone lesions identified. IMPRESSION: 1. Status post cholecystectomy. No signs of intrahepatic or common bile duct dilatation. No evidence for choledocholithiasis. 2. Similar appearance of soft tissue stranding within the small bowel mesentery with trace fluid extending  into the right retroperitoneal space. Findings may be related to recent inflammation or infection including duodenitis and pancreatitis. No focal fluid collections identified. 3. New small cystic structure within the junction between the neck and body of pancreas measures  4 mm. In a patient who has a history of pancreatitis this may represent a small pseudocyst. MRI follow-up of this small cystic structure recommended in 24 months. 4. Prominent upper abdominal lymph nodes are again noted and appears similar to 11/07/21. Etiology indeterminate but may reflect reactive changes in the setting of recent pancreatitis. . Electronically Signed   By: Kerby Moors M.D.   On: 11/11/2021 12:10   US Abdomen Limited  Result Date: 11/09/2021 CLINICAL DATA:  Elevated liver function test. EXAM: ULTRASOUND ABDOMEN LIMITED RIGHT UPPER QUADRANT COMPARISON:  CT, 11/07/2021. FINDINGS: Gallbladder: Surgically absent. No fluid collection within the gallbladder fossa to suggest an abscess or bile leak. Common bile duct: Diameter: 5-6 mm. Echogenic foci within intrahepatic ducts consistent with intraductal air, as noted on the recent CT. Liver: Normal liver size. Mild increased parenchymal echogenicity. No mass or focal lesion. Portal vein is patent on color Doppler imaging with normal direction of blood flow towards the liver. Other: None. IMPRESSION: 1. Status post cholecystectomy. 2. No evidence of biliary obstruction. No sonographic evidence of a bile leak or postoperative abscess. 3. Mild increased liver parenchymal echogenicity, nonspecific. No liver mass or focal lesion. Electronically Signed   By: Lajean Manes M.D.   On: 11/09/2021 10:07   MR ABDOMEN MRCP W WO CONTAST  Result Date: 11/11/2021 CLINICAL DATA:  Upper abdominal pain.  Status post cholecystectomy. EXAM: MRI ABDOMEN WITHOUT AND WITH CONTRAST (INCLUDING MRCP) TECHNIQUE: Multiplanar multisequence MR imaging of the abdomen was performed both before and after the administration of intravenous contrast. Heavily T2-weighted images of the biliary and pancreatic ducts were obtained, and three-dimensional MRCP images were rendered by post processing. CONTRAST:  70mL GADAVIST GADOBUTROL 1 MMOL/ML IV SOLN COMPARISON:  CT AP 11/07/2021 and  MRI abdomen 05/18/2020. FINDINGS: Lower chest: No acute findings. Hepatobiliary: There is a borderline enlarged right cardio phrenic angle lymph node measuring 1.1 cm, image 11/6. On study from 05/18/2020 this measured 0.8 cm. No focal enhancing liver abnormalities identified. Previous cholecystectomy. No intrahepatic or common bile duct dilatation. Pancreas: No pancreatic inflammation or main duct dilatation. New small cystic structure within the junction between the neck and body of pancreas measures 4 mm. Spleen:  Within normal limits in size and appearance. Adrenals/Urinary Tract: Normal adrenal glands. No hydronephrosis identified bilaterally. Cystic structure within the lateral cortex of the interpolar right kidney measures 6 mm, image 28/6. This is technically too small to reliably characterize. Stomach/Bowel: Visualized portions within the abdomen are unremarkable. Vascular/Lymphatic: No aneurysm. Aortic atherosclerosis. As noted on 11/07/2021 there are multiple, borderline enlarged upper abdominal lymph nodes. The index porta hepatic lymph node is stable measuring 1 cm, image 20/6. The index gastrohepatic ligament lymph node measures 0.9 cm, image 77/6. Also unchanged. Right retroperitoneal lymph node is unchanged measuring 1 cm, image 19/6. Other: Similar appearance of soft tissue stranding within the small bowel mesentery with trace fluid extending into the right retroperitoneal space, image 35/6. Musculoskeletal: No suspicious bone lesions identified. IMPRESSION: 1. Status post cholecystectomy. No signs of intrahepatic or common bile duct dilatation. No evidence for choledocholithiasis. 2. Similar appearance of soft tissue stranding within the small bowel mesentery with trace fluid extending into the right retroperitoneal space. Findings may be related to recent inflammation or infection including duodenitis  and pancreatitis. No focal fluid collections identified. 3. New small cystic structure within the  junction between the neck and body of pancreas measures 4 mm. In a patient who has a history of pancreatitis this may represent a small pseudocyst. MRI follow-up of this small cystic structure recommended in 24 months. 4. Prominent upper abdominal lymph nodes are again noted and appears similar to 11/07/21. Etiology indeterminate but may reflect reactive changes in the setting of recent pancreatitis. . Electronically Signed   By: Kerby Moors M.D.   On: 11/11/2021 12:10     Discharge Exam: Vitals:   11/10/21 2104 11/11/21 0521  BP: (!) 151/62 128/78  Pulse: 73 69  Resp: 20 20  Temp: 98.2 F (36.8 C) 98.1 F (36.7 C)  SpO2: 96% 95%   Vitals:   11/10/21 0506 11/10/21 1403 11/10/21 2104 11/11/21 0521  BP: (!) 148/62 119/64 (!) 151/62 128/78  Pulse: 66 75 73 69  Resp: 19 18 20 20   Temp: 98.2 F (36.8 C) 97.8 F (36.6 C) 98.2 F (36.8 C) 98.1 F (36.7 C)  TempSrc: Oral Oral    SpO2: 95% 95% 96% 95%  Weight:      Height:        General: Pt is alert, awake, not in acute distress Cardiovascular: RRR, S1/S2 +, no rubs, no gallops Respiratory: CTA bilaterally, no wheezing, no rhonchi Abdominal: Soft, NT, ND, bowel sounds + Extremities: no edema, no cyanosis    The results of significant diagnostics from this hospitalization (including imaging, microbiology, ancillary and laboratory) are listed below for reference.     Microbiology: Recent Results (from the past 240 hour(s))  Culture, blood (single) w Reflex to ID Panel     Status: Abnormal   Collection Time: 11/05/21 12:00 AM   Specimen: Blood  Result Value Ref Range Status   MICRO NUMBER: [id number]  Corrected   SPECIMEN QUALITY: Adequate  Final   Source L AC  Final   STATUS: FINAL  Final   ISOLATE 1: Escherichia coli (AA)  Final    Comment: Escherichia coli , from aerobic and anaerobic bottles   COMMENT: Aerobic and anaerobic bottle received.  Final      Susceptibility   Escherichia coli - BLOOD CULTURE, NEGATIVE 1     AMOX/CLAVULANIC <=2 Sensitive     AMPICILLIN <=2 Sensitive     AMPICILLIN/SULBACTAM <=2 Sensitive     CEFAZOLIN* <=4 Not Reportable      * For infections other than uncomplicated UTI caused by E. coli, K. pneumoniae or P. mirabilis: Cefazolin is resistant if MIC > or = 8 mcg/mL. (Distinguishing susceptible versus intermediate for isolates with MIC < or = 4 mcg/mL requires additional testing.)     CEFTAZIDIME <=1 Sensitive     CEFEPIME <=1 Sensitive     CEFTRIAXONE <=1 Sensitive     CIPROFLOXACIN <=0.25 Sensitive     LEVOFLOXACIN <=0.12 Sensitive     GENTAMICIN <=1 Sensitive     IMIPENEM <=0.25 Sensitive     PIP/TAZO <=4 Sensitive     TOBRAMYCIN <=1 Sensitive     TRIMETH/SULFA* <=20 Sensitive      * For infections other than uncomplicated UTI caused by E. coli, K. pneumoniae or P. mirabilis: Cefazolin is resistant if MIC > or = 8 mcg/mL. (Distinguishing susceptible versus intermediate for isolates with MIC < or = 4 mcg/mL requires additional testing.) Legend: S = Susceptible  I = Intermediate R = Resistant  NS = Not susceptible * = Not tested  NR = Not reported **NN = See antimicrobic comments   Malaria Smear     Status: None   Collection Time: 11/05/21  2:36 PM   Specimen: Blood  Result Value Ref Range Status   Malaria/Babesia/Other Parasites  NEGATIVE Final    Comment: NEGATIVE HOWEVER, DUE TO THE CYCLICAL SHED RATES OF THESE PARASITES, ONE NEGATIVE SPECIMEN DOES NOT RULE OUT THE POSSIBILITY OF A PARASITIC INFECTION. OBTAIN SPECIMENS AT 6-HOUR INTERVALS FOR 36 HOURS FOR A COMPREHENSIVE EXAMINATION.   Resp Panel by RT-PCR (Flu A&B, Covid) Nasopharyngeal Swab     Status: None   Collection Time: 11/07/21 11:20 PM   Specimen: Nasopharyngeal Swab; Nasopharyngeal(NP) swabs in vial transport medium  Result Value Ref Range Status   SARS Coronavirus 2 by RT PCR NEGATIVE NEGATIVE Final    Comment: (NOTE) SARS-CoV-2 target nucleic acids are NOT DETECTED.  The SARS-CoV-2 RNA  is generally detectable in upper respiratory specimens during the acute phase of infection. The lowest concentration of SARS-CoV-2 viral copies this assay can detect is 138 copies/mL. A negative result does not preclude SARS-Cov-2 infection and should not be used as the sole basis for treatment or other patient management decisions. A negative result may occur with  improper specimen collection/handling, submission of specimen other than nasopharyngeal swab, presence of viral mutation(s) within the areas targeted by this assay, and inadequate number of viral copies(<138 copies/mL). A negative result must be combined with clinical observations, patient history, and epidemiological information. The expected result is Negative.  Fact Sheet for Patients:  EntrepreneurPulse.com.au  Fact Sheet for Healthcare Providers:  IncredibleEmployment.be  This test is no t yet approved or cleared by the [place] FDA and  has been authorized for detection and/or diagnosis of SARS-CoV-2 by FDA under an Emergency Use Authorization (EUA). This EUA will remain  in effect (meaning this test can be used) for the duration of the COVID-19 declaration under Section 564(b)(1) of the Act, 21 U.S.C.section 360bbb-3(b)(1), unless the authorization is terminated  or revoked sooner.       Influenza A by PCR NEGATIVE NEGATIVE Final   Influenza B by PCR NEGATIVE NEGATIVE Final    Comment: (NOTE) The Xpert Xpress SARS-CoV-2/FLU/RSV plus assay is intended as an aid in the diagnosis of influenza from Nasopharyngeal swab specimens and should not be used as a sole basis for treatment. Nasal washings and aspirates are unacceptable for Xpert Xpress SARS-CoV-2/FLU/RSV testing.  Fact Sheet for Patients: EntrepreneurPulse.com.au  Fact Sheet for Healthcare Providers: IncredibleEmployment.be  This test is not yet approved or cleared by the  [place] FDA and has been authorized for detection and/or diagnosis of SARS-CoV-2 by FDA under an Emergency Use Authorization (EUA). This EUA will remain in effect (meaning this test can be used) for the duration of the COVID-19 declaration under Section 564(b)(1) of the Act, 21 U.S.C. section 360bbb-3(b)(1), unless the authorization is terminated or revoked.  Performed at [facility], [address] [place], [place] [address]   Blood culture (routine x 2)     Status: None (Preliminary result)   Collection Time: 11/08/21  6:19 PM   Specimen: Right Antecubital; Blood  Result Value Ref Range Status   Specimen Description RIGHT ANTECUBITAL  Final   Special Requests   Final    BOTTLES DRAWN AEROBIC AND ANAEROBIC Blood Culture adequate volume   Culture   Final    NO GROWTH 3 DAYS Performed at [facility], [address], [place], [place] [address]    Report Status PENDING  Incomplete  Blood culture (routine x 2)     Status: None (Preliminary result)   Collection Time: 11/08/21  6:31 PM   Specimen: BLOOD LEFT ARM  Result Value Ref Range Status   Specimen Description BLOOD LEFT ARM  Final   Special Requests   Final    BOTTLES DRAWN AEROBIC AND ANAEROBIC Blood Culture adequate volume   Culture   Final    NO GROWTH 3 DAYS Performed at [facility], [address] [place], [place] [address]    Report Status PENDING  Incomplete     Labs: BNP (last 3 results) No results for input(s): BNP in the last 8760 hours. Basic Metabolic Panel: Recent Labs  Lab 11/05/21 1435 11/07/21 1421 11/08/21 1818 11/09/21 0424 11/10/21 0448  NA 140 134* 140 140 139  K 4.0 3.9 3.6 3.3* 4.1  CL 106 104 104 107 105  CO2 23 25 28 27 28   GLUCOSE 99 131* 110* 108* 100*  BUN 11 11 9 9 11   CREATININE 0.67* 0.59* 0.59* 0.52* 0.52*  CALCIUM 8.2* 8.3* 8.9 8.4* 8.9  MG  --   --   --  1.9 2.0  PHOS  --   --   --  3.2  --    Liver Function Tests: Recent Labs  Lab 11/05/21 1435 11/07/21 1421  11/09/21 0424 11/10/21 0448 11/11/21 0441  AST 135* 151* 123* 105* 101*  ALT 86* 89* 77* 76* 74*  ALKPHOS  --  490* 422* 400* 390*  BILITOT 1.2 1.2 0.8 0.8 0.8  PROT 6.7 7.6 6.5 6.7 7.0  ALBUMIN  --  3.5 2.9* 3.0* 3.1*   Recent Labs  Lab 11/07/21 1421  LIPASE 34   No results for input(s): AMMONIA in the last 168 hours. CBC: Recent Labs  Lab 11/05/21 1435 11/07/21 1421 11/08/21 1818 11/09/21 0424 11/10/21 0448  WBC 5.9 5.1 4.6 4.1 4.0  NEUTROABS 3,711 3.2 3.0  --   --   HGB 10.8* 11.0* 11.4* 9.9* 10.0*  HCT 33.6* 34.2* 35.7* 30.5* 31.2*  MCV 86.4 88.8 88.1 87.1 87.2  PLT 154 138* 136* 119* 122*   Cardiac Enzymes: No results for input(s): CKTOTAL, CKMB, CKMBINDEX, TROPONINI in the last 168 hours. BNP: Invalid input(s): POCBNP CBG: No results for input(s): GLUCAP in the last 168 hours. D-Dimer No results for input(s): DDIMER in the last 72 hours. Hgb A1c No results for input(s): HGBA1C in the last 72 hours. Lipid Profile No results for input(s): CHOL, HDL, LDLCALC, TRIG, CHOLHDL, LDLDIRECT in the last 72 hours. Thyroid function studies No results for input(s): TSH, T4TOTAL, T3FREE, THYROIDAB in the last 72 hours.  Invalid input(s): FREET3 Anemia work up Recent Labs    11/10/21 0448  VITAMINB12 839  FOLATE 13.5  FERRITIN 20*  TIBC 387  IRON 79   Urinalysis    Component Value Date/Time   COLORURINE YELLOW 11/08/2021 2050   APPEARANCEUR CLEAR 11/08/2021 2050   LABSPEC 1.010 11/08/2021 2050   [place] 6.5 11/08/2021 2050   GLUCOSEU NEGATIVE 11/08/2021 2050   [place] NEGATIVE 11/08/2021 2050   [place] NEGATIVE 11/08/2021 2050   KETONESUR NEGATIVE 11/08/2021 2050   PROTEINUR NEGATIVE 11/08/2021 2050   UROBILINOGEN 0.2 09/27/2012 1445   NITRITE NEGATIVE 11/08/2021 2050   LEUKOCYTESUR NEGATIVE 11/08/2021 2050   Sepsis Labs Invalid input(s): PROCALCITONIN,  WBC,  LACTICIDVEN Microbiology Recent Results (from the past 240 hour(s))  Culture, blood  (single) w Reflex to ID Panel     Status: Abnormal  Collection Time: 11/05/21 12:00 AM   Specimen: Blood  Result Value Ref Range Status   MICRO NUMBER: [id number]  Corrected   SPECIMEN QUALITY: Adequate  Final   Source L AC  Final   STATUS: FINAL  Final   ISOLATE 1: Escherichia coli (AA)  Final    Comment: Escherichia coli , from aerobic and anaerobic bottles   COMMENT: Aerobic and anaerobic bottle received.  Final      Susceptibility   Escherichia coli - BLOOD CULTURE, NEGATIVE 1    AMOX/CLAVULANIC <=2 Sensitive     AMPICILLIN <=2 Sensitive     AMPICILLIN/SULBACTAM <=2 Sensitive     CEFAZOLIN* <=4 Not Reportable      * For infections other than uncomplicated UTI caused by E. coli, K. pneumoniae or P. mirabilis: Cefazolin is resistant if MIC > or = 8 mcg/mL. (Distinguishing susceptible versus intermediate for isolates with MIC < or = 4 mcg/mL requires additional testing.)     CEFTAZIDIME <=1 Sensitive     CEFEPIME <=1 Sensitive     CEFTRIAXONE <=1 Sensitive     CIPROFLOXACIN <=0.25 Sensitive     LEVOFLOXACIN <=0.12 Sensitive     GENTAMICIN <=1 Sensitive     IMIPENEM <=0.25 Sensitive     PIP/TAZO <=4 Sensitive     TOBRAMYCIN <=1 Sensitive     TRIMETH/SULFA* <=20 Sensitive      * For infections other than uncomplicated UTI caused by E. coli, K. pneumoniae or P. mirabilis: Cefazolin is resistant if MIC > or = 8 mcg/mL. (Distinguishing susceptible versus intermediate for isolates with MIC < or = 4 mcg/mL requires additional testing.) Legend: S = Susceptible  I = Intermediate R = Resistant  NS = Not susceptible * = Not tested  NR = Not reported **NN = See antimicrobic comments   Malaria Smear     Status: None   Collection Time: 11/05/21  2:36 PM   Specimen: Blood  Result Value Ref Range Status   Malaria/Babesia/Other Parasites  NEGATIVE Final    Comment: NEGATIVE HOWEVER, DUE TO THE CYCLICAL SHED RATES OF THESE PARASITES, ONE NEGATIVE SPECIMEN DOES NOT RULE OUT THE  POSSIBILITY OF A PARASITIC INFECTION. OBTAIN SPECIMENS AT 6-HOUR INTERVALS FOR 36 HOURS FOR A COMPREHENSIVE EXAMINATION.   Resp Panel by RT-PCR (Flu A&B, Covid) Nasopharyngeal Swab     Status: None   Collection Time: 11/07/21 11:20 PM   Specimen: Nasopharyngeal Swab; Nasopharyngeal(NP) swabs in vial transport medium  Result Value Ref Range Status   SARS Coronavirus 2 by RT PCR NEGATIVE NEGATIVE Final    Comment: (NOTE) SARS-CoV-2 target nucleic acids are NOT DETECTED.  The SARS-CoV-2 RNA is generally detectable in upper respiratory specimens during the acute phase of infection. The lowest concentration of SARS-CoV-2 viral copies this assay can detect is 138 copies/mL. A negative result does not preclude SARS-Cov-2 infection and should not be used as the sole basis for treatment or other patient management decisions. A negative result may occur with  improper specimen collection/handling, submission of specimen other than nasopharyngeal swab, presence of viral mutation(s) within the areas targeted by this assay, and inadequate number of viral copies(<138 copies/mL). A negative result must be combined with clinical observations, patient history, and epidemiological information. The expected result is Negative.  Fact Sheet for Patients:  EntrepreneurPulse.com.au  Fact Sheet for Healthcare Providers:  IncredibleEmployment.be  This test is no t yet approved or cleared by the [place] FDA and  has been authorized for detection and/or diagnosis of SARS-CoV-2  by FDA under an Emergency Use Authorization (EUA). This EUA will remain  in effect (meaning this test can be used) for the duration of the COVID-19 declaration under Section 564(b)(1) of the Act, 21 U.S.C.section 360bbb-3(b)(1), unless the authorization is terminated  or revoked sooner.       Influenza A by PCR NEGATIVE NEGATIVE Final   Influenza B by PCR NEGATIVE NEGATIVE Final     Comment: (NOTE) The Xpert Xpress SARS-CoV-2/FLU/RSV plus assay is intended as an aid in the diagnosis of influenza from Nasopharyngeal swab specimens and should not be used as a sole basis for treatment. Nasal washings and aspirates are unacceptable for Xpert Xpress SARS-CoV-2/FLU/RSV testing.  Fact Sheet for Patients: EntrepreneurPulse.com.au  Fact Sheet for Healthcare Providers: IncredibleEmployment.be  This test is not yet approved or cleared by the [place] FDA and has been authorized for detection and/or diagnosis of SARS-CoV-2 by FDA under an Emergency Use Authorization (EUA). This EUA will remain in effect (meaning this test can be used) for the duration of the COVID-19 declaration under Section 564(b)(1) of the Act, 21 U.S.C. section 360bbb-3(b)(1), unless the authorization is terminated or revoked.  Performed at [facility], [address], [place], [place] [address]   Blood culture (routine x 2)     Status: None (Preliminary result)   Collection Time: 11/08/21  6:19 PM   Specimen: Right Antecubital; Blood  Result Value Ref Range Status   Specimen Description RIGHT ANTECUBITAL  Final   Special Requests   Final    BOTTLES DRAWN AEROBIC AND ANAEROBIC Blood Culture adequate volume   Culture   Final    NO GROWTH 3 DAYS Performed at [facility], [address], [place], [place] [address]    Report Status PENDING  Incomplete  Blood culture (routine x 2)     Status: None (Preliminary result)   Collection Time: 11/08/21  6:31 PM   Specimen: BLOOD LEFT ARM  Result Value Ref Range Status   Specimen Description BLOOD LEFT ARM  Final   Special Requests   Final    BOTTLES DRAWN AEROBIC AND ANAEROBIC Blood Culture adequate volume   Culture   Final    NO GROWTH 3 DAYS Performed at [facility], [address], [place], [place] [address]    Report Status PENDING  Incomplete     Time coordinating discharge: 35  minutes  SIGNED:   Rodena Goldmann, DO [facility] 11/11/2021, 1:29 PM  If 7PM-7AM, please contact night-coverage [email or web address]

## 2021-11-11 NOTE — Progress Notes (Signed)
° ° °  Subjective: Sleeping but easily awakens. In good spirits. No abdominal pain. No N/V. Just completed MRI. Daughter at bedside.   Objective: Vital signs in last 24 hours: Temp:  [97.8 F (36.6 C)-98.2 F (36.8 C)] 98.1 F (36.7 C) (02/27 0521) Pulse Rate:  [69-75] 69 (02/27 0521) Resp:  [18-20] 20 (02/27 0521) BP: (119-151)/(62-78) 128/78 (02/27 0521) SpO2:  [95 %-96 %] 95 % (02/27 0521) Last BM Date : 11/11/21 General:   resting with eyes closed but easily awakens.  Abdomen:  Bowel sounds present, soft, non-tender, non-distended. Small umbilical hernia.  Extremities:  Without  edema. Neurologic:  oriented x4   Intake/Output from previous day: 02/26 0701 - 02/27 0700 In: 1120 [P.O.:720; IV Piggyback:400] Out: -  Intake/Output this shift: No intake/output data recorded.  Lab Results: Recent Labs    11/08/21 1818 11/09/21 0424 11/10/21 0448  WBC 4.6 4.1 4.0  HGB 11.4* 9.9* 10.0*  HCT 35.7* 30.5* 31.2*  PLT 136* 119* 122*   BMET Recent Labs    11/08/21 1818 11/09/21 0424 11/10/21 0448  NA 140 140 139  K 3.6 3.3* 4.1  CL 104 107 105  CO2 _0 GLUCOSE 110* 108* 100*  BUN _1 CREATININE 0.59* 0.52* 0.52*  CALCIUM 8.9 8.4* 8.9   LFT Recent Labs    11/09/21 0424 11/10/21 0448 11/11/21 0441  PROT 6.5 6.7 7.0  ALBUMIN 2.9* 3.0* 3.1*  AST 123* 105* 101*  ALT 77* 76* 74*  ALKPHOS 422* 400* 390*  BILITOT 0.8 0.8 0.8  BILIDIR  --   --  0.2  IBILI  --   --  0.6     Assessment:  86 year old male with past history significant for biliary pancreatitis, s/p ERCP with sphincterotomy and stone extraction by Dr. Laural Golden Sept 2021, then undergoing lap chole thereafter with bile duct transection and subsequently underwent urgent Roux-en-Y hepaticojejunostomy by Dr. Carlis Abbott. Presented this admission with fever and rigors for 6 weeks and found to have acute cholangitis with E.coli bacteremia secondary to hepaticojejunostomy stricture.Currently on empiric  antibiotics, with MRCP for today.   Acute cholangitis: afebrile. Rigors resolved. No abdominal pain. MRI/MRCP is pending. Once resulted, will reach out to Dr. Laural Golden, who will be contacting [facility].  Elevated transaminases and alk phos: similar to prior in 2022. Acute hepatitis panel negative this admission. Alk phos improving. Transaminases with slight improvement. Tbili remains normal.   Anemia: Hgb stable. Ferritin low at 20. Multifactorial with IDA, chronic disease component. Heme positive. No overt GI bleeding. Can follow-up as outpatient.   Clinically, he is stable with discharge home appropriate. Will need additional week of oral antibiotics (e.g. Cipro) following discharge.    Plan: Follow-up on pending MRI/MRCP May discharge home on oral Cipro for an additional week Further evaluation of anemia as outpatient Will arrange outpatient follow-up  Annitta Needs, PhD, ANP-BC [facility]     LOS: 3 days    11/11/2021, 10:02 AM

## 2021-11-11 NOTE — Telephone Encounter (Signed)
Please arrange outpatient hospital follow-up. Thanks!

## 2021-11-13 LAB — CULTURE, BLOOD (ROUTINE X 2)
Culture: NO GROWTH
Culture: NO GROWTH
Special Requests: ADEQUATE
Special Requests: ADEQUATE

## 2021-11-21 ENCOUNTER — Inpatient Hospital Stay: Payer: Medicare HMO | Admitting: Family Medicine

## 2021-11-21 DIAGNOSIS — K8309 Other cholangitis: Secondary | ICD-10-CM | POA: Diagnosis not present

## 2021-11-25 ENCOUNTER — Ambulatory Visit (INDEPENDENT_AMBULATORY_CARE_PROVIDER_SITE_OTHER): Payer: Medicare HMO | Admitting: Family Medicine

## 2021-11-25 ENCOUNTER — Other Ambulatory Visit: Payer: Self-pay

## 2021-11-25 ENCOUNTER — Encounter: Payer: Self-pay | Admitting: Family Medicine

## 2021-11-25 VITALS — BP 128/62 | HR 69 | Temp 98.1°F | Resp 18 | Ht 66.0 in | Wt 140.0 lb

## 2021-11-25 DIAGNOSIS — B9620 Unspecified Escherichia coli [E. coli] as the cause of diseases classified elsewhere: Secondary | ICD-10-CM | POA: Diagnosis not present

## 2021-11-25 DIAGNOSIS — R7881 Bacteremia: Secondary | ICD-10-CM

## 2021-11-25 NOTE — Progress Notes (Signed)
Subjective:    Patient ID: Randy Thomas, male    DOB: 1935-10-09, 86 y.o.   MRN: [id number]  Admit date: 11/08/2021   Discharge date: 11/11/2021   Admitted From:Home   Disposition:  Home   Recommendations for Outpatient Follow-up:  Follow up with PCP in 1-2 weeks Follow-up with GI outpatient which will be scheduled Continue on ciprofloxacin for 7 days as prescribed Continue on other home medications as prior   Home Health: None   Equipment/Devices: None   Discharge Condition:Stable   CODE STATUS: Full   Diet recommendation: Heart Healthy   Brief/Interim Summary: Per HPI: Randy Thomas is a 86 y.o. male with medical history significant of hypertension, myocardial infarction, hyperlipidemia who presents to the emergency department due to positive blood cultures.  Patient received a call from his PCP yesterday and was asked to go to the ED due to positive blood cultures.  He presented to the ED yesterday and was started on IV ceftriaxone, patient was going to be admitted, but he signed AMA and left the ED, patient states that it was because he was not mentally prepared for admission and that he is currently taking care of all he needed to take care of at home and wants to continue his treatment and that he was mentally prepared for admission at this time. Patient states that he had a complicated cholecystectomy which required transfer to [facility] in 2021 and that he has been having intermittent low-grade fevers in the range of 90-100 since then.  He denies any other symptomatology, patient denies chest pain, shortness of breath, nausea, vomiting, abdominal pain, irritative bladder symptoms.   11/09/21: Patient has been admitted for E. coli bacteremia that is likely of GI source.  Appreciate further evaluation with GI with likely need for MRCP and/or eventual transfer to [facility].  Continue IV antibiotics as ordered for now.  Patient is otherwise asymptomatic at this  time.   11/10/21: Patient is overall doing well and awaiting MRCP in a.m.  Anticipate discharge to home tomorrow if overall doing well on oral antibiotics.    11/11/2021: MRCP with no significant findings noted and reviewed with GI.  He is stable for discharge on ciprofloxacin for 1 week with outpatient follow-up which will be scheduled.  No other acute events noted throughout the course of this admission.  He is noted to have chronic anemia with Hemoccult positive that will be followed up with GI outpatient.   Discharge Diagnoses:  Principal Problem:   Gram-negative bacteremia Active Problems:   Coronary artery disease   HTN (hypertension)   Transaminitis   Thrombocytopenia (HCC)   Duodenitis   Principal discharge diagnosis: Acute cholangitis with E. 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   11/25/21 Patient is here today for follow-up.  He has been off Cipro for 1 week.  He had 1 episode of night sweats on Sunday.  He denies any objective fevers.  He denies any nausea or vomiting or abdominal pain.  He does occasionally have burning pain in his abdomen when he eats but this is been going on for over a year.  He denies any emesis.  He denies any melena or hematochezia.  He denies any cough or shortness of breath or chest pain Past Medical History:  Diagnosis Date   Allergy    GERD (gastroesophageal reflux disease)    Hard of hearing    Hypercholesteremia    Hyperlipidemia    Hypertension  Myocardial infarction ([place])    X 2 stents   Past Surgical History:  Procedure Laterality Date   CARDIAC CATHETERIZATION     X 2 stents   CHOLECYSTECTOMY N/A 05/20/2020   Procedure: LAPAROSCOPIC CHOLECYSTECTOMY;  Surgeon: [name], MD;  Location: [facility];  Service: General;  Laterality: N/A;   ERCP N/A 05/18/2020   Procedure: ENDOSCOPIC RETROGRADE CHOLANGIOPANCREATOGRAPHY (ERCP) ;  Surgeon: Rogene Houston, MD;  Location: [facility];  Service: Endoscopy;   Laterality: N/A;   ERCP N/A 05/20/2020   Procedure: ENDOSCOPIC RETROGRADE CHOLANGIOPANCREATOGRAPHY (ERCP);  Surgeon: [name], MD;  Location: [facility];  Service: General;  Laterality: N/A;   [place] wore cast   SPHINCTEROTOMY  05/18/2020   Procedure: SPHINCTEROTOMY mild;  Surgeon: Rogene Houston, MD;  Location: [facility];  Service: Endoscopy;;   TONSILLECTOMY     as a child   Current Outpatient Medications on File Prior to Visit  Medication Sig Dispense Refill   Ascorbic Acid (VITAMIN C) 1000 MG tablet Take 1,000 mg by mouth daily.     aspirin EC 81 MG tablet Take 1 tablet (81 mg total) by mouth daily. 30 tablet 3   b complex vitamins capsule Take 1 capsule by mouth daily.     cetirizine (ZYRTEC) 10 MG tablet TAKE 1 TABLET(10 MG) BY MOUTH DAILY (Patient taking differently: Take 10 mg by mouth daily.) 30 tablet 11   cholecalciferol (VITAMIN D) 1000 units tablet Take 1 tablet (1,000 Units total) by mouth daily. 30 tablet 3   lansoprazole (PREVACID) 30 MG capsule Take 30 mg by mouth daily at 12 noon.     methocarbamol (ROBAXIN) 750 MG tablet Take 1 tablet (750 mg total) by mouth 4 (four) times daily. (Patient taking differently: Take 750 mg by mouth 2 (two) times daily. As needed) 60 tablet 2   metoprolol succinate (TOPROL-XL) 50 MG 24 hr tablet TAKE 1/2 TABLET BY MOUTH IN THE MORNING AND 1/2 TABLET BY MOUTH IN THE EVENING (Patient taking differently: 25 mg in the morning.) 60 tablet 4   Multiple Vitamin (MULTIVITAMIN WITH MINERALS) TABS tablet Take 1 tablet by mouth daily. 30 tablet 3   nitroGLYCERIN (NITROSTAT) 0.4 MG SL tablet Place 0.4 mg under the tongue every 5 (five) minutes as needed for chest pain.     pantoprazole (PROTONIX) 40 MG tablet Take 40 mg by mouth daily.     rosuvastatin (CRESTOR) 20 MG tablet Take 20 mg by mouth every evening.     VASCEPA 1 g capsule Take 1 capsule by mouth in the morning and at bedtime. (Patient not taking: Reported on 11/10/2021)      vitamin B-12 (CYANOCOBALAMIN) 50 MCG tablet Take 50 mcg by mouth daily.     HYDROcodone-acetaminophen (NORCO/VICODIN) 5-325 MG tablet Take 1 tablet by mouth every 6 (six) hours as needed. (Patient not taking: Reported on 11/10/2021) 15 tablet 0   No current facility-administered medications on file prior to visit.   No Known Allergies  Social History   Socioeconomic History   Marital status: Married    Spouse name: Not on file   Number of children: Not on file   Years of education: Not on file   Highest education level: Not on file  Occupational History   Occupation: retired    Comment: truck driver  Tobacco Use   Smoking status: Former    Types: Cigarettes    Quit date: 09/15/1981    Years  since quitting: 40.2   Smokeless tobacco: Never  Vaping Use   Vaping Use: Never used  Substance and Sexual Activity   Alcohol use: No    Alcohol/week: 0.0 standard drinks   Drug use: No   Sexual activity: Not Currently  Other Topics Concern   Not on file  Social History Narrative   Not on file   Social Determinants of Health   Financial Resource Strain: Not on file  Food Insecurity: Not on file  Transportation Needs: Not on file  Physical Activity: Not on file  Stress: Not on file  Social Connections: Not on file  Intimate Partner Violence: Not on file    Review of Systems  Constitutional:  Positive for fever.  All other systems reviewed and are negative.     Objective:   Physical Exam Vitals reviewed.  Constitutional:      Appearance: Normal appearance. He is normal weight. He is not ill-appearing or diaphoretic.  HENT:     Mouth/Throat:     Mouth: Mucous membranes are moist.  Cardiovascular:     Rate and Rhythm: Normal rate and regular rhythm.     Heart sounds: Normal heart sounds. No murmur heard.   No friction rub. No gallop.  Pulmonary:     Effort: Pulmonary effort is normal. No respiratory distress.     Breath sounds: Normal breath sounds. No stridor. No  wheezing, rhonchi or rales.  Abdominal:     General: Abdomen is flat. Bowel sounds are normal. There is no distension.     Palpations: Abdomen is soft.     Tenderness: There is no abdominal tenderness. There is no guarding or rebound.  Musculoskeletal:     Right lower leg: No edema.     Left lower leg: No edema.  Neurological:     Mental Status: He is alert.          Assessment & Plan:  E coli bacteremia - Plan: CBC with Differential/Platelet, COMPLETE METABOLIC PANEL WITH GFR, Culture, blood (single) w Reflex to ID Panel I believe that the patient's E. coli bacteremia was likely due to a GI source stemming from his previous hepaticojejunostomy stricture.  He has an appointment next week to see his surgeon at [facility].  I will repeat blood cultures today along with a CBC and CMP.  As long as the patient remains asymptomatic, treatment is not necessary.  However if the patient again has positive blood cultures for gram-negative rods, I would recommend a referral to [facility].

## 2021-11-30 LAB — CBC WITH DIFFERENTIAL/PLATELET
Absolute Monocytes: 734 cells/uL (ref 200–950)
Basophils Absolute: 62 cells/uL (ref 0–200)
Basophils Relative: 1.1 %
Eosinophils Absolute: 140 cells/uL (ref 15–500)
Eosinophils Relative: 2.5 %
HCT: 34.2 % — ABNORMAL LOW (ref 38.5–50.0)
Hemoglobin: 10.9 g/dL — ABNORMAL LOW (ref 13.2–17.1)
Lymphs Abs: 1120 cells/uL (ref 850–3900)
MCH: 27.4 pg (ref 27.0–33.0)
MCHC: 31.9 g/dL — ABNORMAL LOW (ref 32.0–36.0)
MCV: 85.9 fL (ref 80.0–100.0)
MPV: 12.6 fL — ABNORMAL HIGH (ref 7.5–12.5)
Monocytes Relative: 13.1 %
Neutro Abs: 3545 cells/uL (ref 1500–7800)
Neutrophils Relative %: 63.3 %
Platelets: 169 10*3/uL (ref 140–400)
RBC: 3.98 10*6/uL — ABNORMAL LOW (ref 4.20–5.80)
RDW: 14.3 % (ref 11.0–15.0)
Total Lymphocyte: 20 %
WBC: 5.6 10*3/uL (ref 3.8–10.8)

## 2021-11-30 LAB — COMPLETE METABOLIC PANEL WITH GFR
AG Ratio: 1.2 (calc) (ref 1.0–2.5)
ALT: 87 U/L — ABNORMAL HIGH (ref 9–46)
AST: 152 U/L — ABNORMAL HIGH (ref 10–35)
Albumin: 3.8 g/dL (ref 3.6–5.1)
Alkaline phosphatase (APISO): 519 U/L — ABNORMAL HIGH (ref 35–144)
BUN/Creatinine Ratio: 16 (calc) (ref 6–22)
BUN: 11 mg/dL (ref 7–25)
CO2: 24 mmol/L (ref 20–32)
Calcium: 8.7 mg/dL (ref 8.6–10.3)
Chloride: 104 mmol/L (ref 98–110)
Creat: 0.68 mg/dL — ABNORMAL LOW (ref 0.70–1.22)
Globulin: 3.3 g/dL (calc) (ref 1.9–3.7)
Glucose, Bld: 108 mg/dL — ABNORMAL HIGH (ref 65–99)
Potassium: 4.3 mmol/L (ref 3.5–5.3)
Sodium: 138 mmol/L (ref 135–146)
Total Bilirubin: 1 mg/dL (ref 0.2–1.2)
Total Protein: 7.1 g/dL (ref 6.1–8.1)
eGFR: 92 mL/min/{1.73_m2} (ref >=60)

## 2021-11-30 LAB — CULTURE, BLOOD (SINGLE): MICRO NUMBER:: 13122506

## 2021-12-02 NOTE — Progress Notes (Signed)
? ? ? ?GI Office Note   ? ?Referring Provider: Susy Frizzle, MD ?Primary Care Physician:  Susy Frizzle, MD  ?Primary Gastroenterologist: Garfield Cornea, MD ? ? ?Chief Complaint  ? ?Chief Complaint  ?Patient presents with  ? Follow-up  ?  Complains of abd burning when eating some foods like sausage.  ? ? ?History of Present Illness  ? ?Randy Thomas is a 86 y.o. male presenting today for hospital follow-up. Patient with history of biliary pancreatitis in 05/2020, underwent ERCP with stone extraction and lap chole thereafter complicated by bile duct transection requiring urgent Roux-en-Y hepaticojejunostomy by Dr. Carlis Abbott. Presented for recent admission for acute cholangitis with E.coli bacteremia secondary to hepaticojejunostomy stricture. He was seen while inpatient, also noted to have mixed anemia with IDA, heme + stool. ? ?Seen by PCP 11/25/21 with subjective fever. Labs updated. Tbili normal. AP 519, AST 152 up from 101, ALT 87, up from 74. Blood culture repeated, was negative. WBC 5600, Hgb 10.9, platelets 169000. ? ?Today: overall feels ok. No abdominal pain, fever, constipation, diarrhea, melena, brbpr. He has an appt with Dr. Carlis Abbott on Monday. He has never had a colonoscopy or EGD. He denies SOB, DOE, lightheadedness. While inpatient in 10/2021, noted to have normocytic anemia. Hgb in the 9-10 range. Ferritin 20. Iron studies otherwise normal. B12/folate normal.He was heme positive.  ? ?MRCP 11/21/21: ? ?1.  Status post hepaticojejunostomy with new mild biliary ductal dilatation concerning for strictures at the anastomoses.  ?2.  Limited examination for biliary leak, however no secondary signs of anastomotic leak.  ?3.  Ancillary findings as above ? ? ?MRCP 11/11/21: ? ?IMPRESSION: ?1. Status post cholecystectomy. No signs of intrahepatic or common ?bile duct dilatation. No evidence for choledocholithiasis. ?2. Similar appearance of soft tissue stranding within the small ?bowel mesentery with trace fluid  extending into the right ?retroperitoneal space. Findings may be related to recent ?inflammation or infection including duodenitis and pancreatitis. No ?focal fluid collections identified. ?3. New small cystic structure within the junction between the neck ?and body of pancreas measures 4 mm. In a patient who has a history ?of pancreatitis this may represent a small pseudocyst. MRI follow-up ?of this small cystic structure recommended in 24 months. ?4. Prominent upper abdominal lymph nodes are again noted and appears ?similar to 11/07/21. Etiology indeterminate but may reflect reactive ?changes in the setting of recent pancreatitis. ?  ?  ?Electronically Signed ?  By: Kerby Moors M.D. ?  On: 11/11/2021 12:10 ?  ? ? ?CT A/P with contrast: 11/07/21 ?IMPRESSION:  ?1. Interval postoperative changes of cholecystectomy and Roux-en-Y  ?hepaticojejunostomy. No bowel obstruction.  ?2. Mild retroperitoneal fat stranding along the second portion of  ?the duodenum with mild duodenal wall thickening. No discrete  ?ulceration or findings of perforation. No focal fluid collection.  ?Findings may represent duodenitis versus recurrent pancreatitis.  ?Recommend correlation with labs.  ?3. New enlarged superior retroperitoneal lymph node, as well as  ?other prominent nodes in the gastrohepatic ligament and at the porta  ?hepatis, are favored to be reactive. Recommend attention on  ?follow-up imaging.  ?4. Diverticulosis of the distal descending and sigmoid colon without  ?findings of diverticulitis.  ?5.  Aortic Atherosclerosis (ICD10-I70.0).  ? ?Electronically Signed  ?  By: Ileana Roup M.D.  ?  On: 11/07/2021 16:42 ?Exam End: 11/07/21 15:23 Last Resulted: 11/07/21 16:42  ? ? ?Medications  ? ?Current Outpatient Medications  ?Medication Sig Dispense Refill  ? Ascorbic Acid (VITAMIN C)  1000 MG tablet Take 1,000 mg by mouth daily.    ? aspirin EC 81 MG tablet Take 1 tablet (81 mg total) by mouth daily. 30 tablet 3  ? b complex  vitamins capsule Take 1 capsule by mouth daily.    ? cetirizine (ZYRTEC) 10 MG tablet TAKE 1 TABLET(10 MG) BY MOUTH DAILY (Patient taking differently: Take 10 mg by mouth daily.) 30 tablet 11  ? cholecalciferol (VITAMIN D) 1000 units tablet Take 1 tablet (1,000 Units total) by mouth daily. 30 tablet 3  ? HYDROcodone-acetaminophen (NORCO/VICODIN) 5-325 MG tablet Take 1 tablet by mouth every 6 (six) hours as needed. 15 tablet 0  ? methocarbamol (ROBAXIN) 750 MG tablet Take 1 tablet (750 mg total) by mouth 4 (four) times daily. (Patient taking differently: Take 750 mg by mouth 2 (two) times daily. As needed) 60 tablet 2  ? metoprolol succinate (TOPROL-XL) 50 MG 24 hr tablet TAKE 1/2 TABLET BY MOUTH IN THE MORNING AND 1/2 TABLET BY MOUTH IN THE EVENING (Patient taking differently: 25 mg in the morning.) 60 tablet 4  ? Multiple Vitamin (MULTIVITAMIN WITH MINERALS) TABS tablet Take 1 tablet by mouth daily. 30 tablet 3  ? nitroGLYCERIN (NITROSTAT) 0.4 MG SL tablet Place 0.4 mg under the tongue every 5 (five) minutes as needed for chest pain.    ? pantoprazole (PROTONIX) 40 MG tablet Take 40 mg by mouth daily.    ? rosuvastatin (CRESTOR) 20 MG tablet Take 20 mg by mouth every evening.    ? VASCEPA 1 g capsule Take 1 capsule by mouth in the morning and at bedtime.    ? vitamin B-12 (CYANOCOBALAMIN) 50 MCG tablet Take 50 mcg by mouth daily.    ? lansoprazole (PREVACID) 30 MG capsule Take 30 mg by mouth daily at 12 noon. (Patient not taking: Reported on 12/03/2021)    ? ?No current facility-administered medications for this visit.  ? ? ?Allergies  ? ?Allergies as of 12/03/2021  ? (No Known Allergies)  ? ? ?Past Medical History  ? ?Past Medical History:  ?Diagnosis Date  ? Allergy   ? GERD (gastroesophageal reflux disease)   ? Hard of hearing   ? Hypercholesteremia   ? Hyperlipidemia   ? Hypertension   ? Myocardial infarction [facility])   ? X 2 stents  ? ? ?Past Surgical History  ? ?Past Surgical History:  ?Procedure Laterality Date   ? CARDIAC CATHETERIZATION    ? X 2 stents  ? CHOLECYSTECTOMY N/A 05/20/2020  ? Procedure: LAPAROSCOPIC CHOLECYSTECTOMY;  Surgeon: [name], MD;  Location: [facility];  Service: General;  Laterality: N/A;  ? ERCP N/A 05/18/2020  ? Procedure: ENDOSCOPIC RETROGRADE CHOLANGIOPANCREATOGRAPHY (ERCP) ;  Surgeon: Rogene Houston, MD;  Location: [facility];  Service: Endoscopy;  Laterality: N/A;  ? ERCP N/A 05/20/2020  ? Procedure: ENDOSCOPIC RETROGRADE CHOLANGIOPANCREATOGRAPHY (ERCP);  Surgeon: [name], MD;  Location: [facility];  Service: General;  Laterality: N/A;  ? FRACTURE SURGERY    ? 1967 wore cast  ? SPHINCTEROTOMY  05/18/2020  ? Procedure: SPHINCTEROTOMY mild;  Surgeon: Rogene Houston, MD;  Location: [facility];  Service: Endoscopy;;  ? TONSILLECTOMY    ? as a child  ? ? ?Past Family History  ? ?Family History  ?Problem Relation Age of Onset  ? Asthma Mother   ? Hearing loss Mother   ? Heart disease Father   ? Diabetes Brother   ? Diabetes Maternal Grandmother   ? Colon cancer  Neg Hx   ? Colon polyps Neg Hx   ? ? ?Past Social History  ? ?Social History  ? ?Socioeconomic History  ? Marital status: Married  ?  Spouse name: Not on file  ? Number of children: Not on file  ? Years of education: Not on file  ? Highest education level: Not on file  ?Occupational History  ? Occupation: retired  ?  Comment: truck driver  ?Tobacco Use  ? Smoking status: Former  ?  Types: Cigarettes  ?  Quit date: 09/15/1981  ?  Years since quitting: 40.2  ? Smokeless tobacco: Never  ?Vaping Use  ? Vaping Use: Never used  ?Substance and Sexual Activity  ? Alcohol use: No  ?  Alcohol/week: 0.0 standard drinks  ? Drug use: No  ? Sexual activity: Not Currently  ?Other Topics Concern  ? Not on file  ?Social History Narrative  ? Not on file  ? ?Social Determinants of Health  ? ?Financial Resource Strain: Not on file  ?Food Insecurity: Not on file  ?Transportation Needs: Not on file  ?Physical Activity: Not on file  ?Stress: Not on  file  ?Social Connections: Not on file  ?Intimate Partner Violence: Not on file  ? ? ?Review of Systems  ? ?General: Negative for anorexia, weight loss, fever, chills, fatigue, weakness.see hpi ?ENT: Negative fo

## 2021-12-03 ENCOUNTER — Encounter: Payer: Self-pay | Admitting: Gastroenterology

## 2021-12-03 ENCOUNTER — Ambulatory Visit: Payer: Medicare HMO | Admitting: Gastroenterology

## 2021-12-03 ENCOUNTER — Other Ambulatory Visit: Payer: Self-pay

## 2021-12-03 DIAGNOSIS — D500 Iron deficiency anemia secondary to blood loss (chronic): Secondary | ICD-10-CM

## 2021-12-03 DIAGNOSIS — K8309 Other cholangitis: Secondary | ICD-10-CM | POA: Insufficient documentation

## 2021-12-03 DIAGNOSIS — D509 Iron deficiency anemia, unspecified: Secondary | ICD-10-CM | POA: Insufficient documentation

## 2021-12-03 NOTE — Patient Instructions (Signed)
Keep upcoming appointment with Dr. Carlis Abbott. I will be looking at records as available to see what his plans are. Once you are done with work up with him, we will reach out and determine timing of next labs and possible endoscopy or colonoscopy to evaluate your anemia.  ?

## 2021-12-09 DIAGNOSIS — S3613XD Injury of bile duct, subsequent encounter: Secondary | ICD-10-CM | POA: Diagnosis not present

## 2021-12-09 DIAGNOSIS — Z48815 Encounter for surgical aftercare following surgery on the digestive system: Secondary | ICD-10-CM | POA: Diagnosis not present

## 2021-12-09 DIAGNOSIS — K8309 Other cholangitis: Secondary | ICD-10-CM | POA: Diagnosis not present

## 2022-01-01 ENCOUNTER — Other Ambulatory Visit: Payer: Self-pay

## 2022-01-01 NOTE — Telephone Encounter (Signed)
LOV 3/13 ?Last refill 09/20/21, #15, 0 refills ? ?Please review, thanks! ? ? ?

## 2022-01-01 NOTE — Telephone Encounter (Signed)
Pt's spouse called in requesting a 30 day refill of  ? ?HYDROcodone-acetaminophen (NORCO/VICODIN) 5-325 MG tablet [[id number]]  ?  Order Details ?Dose: 1 tablet Route: Oral Frequency: Every 6 hours PRN  ?Dispense Quantity: 15 tablet Refills: 0   ?     ?Sig: Take 1 tablet by mouth every 6 (six) hours as needed.  ?     ?Start Date: 09/20/21 End Date: --  ?Written Date: 09/20/21 Expiration Date: 03/19/22  ?Earliest Fill Date: 09/20/21    ? ?

## 2022-01-02 DIAGNOSIS — I2510 Atherosclerotic heart disease of native coronary artery without angina pectoris: Secondary | ICD-10-CM | POA: Diagnosis not present

## 2022-01-02 DIAGNOSIS — I252 Old myocardial infarction: Secondary | ICD-10-CM | POA: Diagnosis not present

## 2022-01-02 DIAGNOSIS — S3613XD Injury of bile duct, subsequent encounter: Secondary | ICD-10-CM | POA: Diagnosis not present

## 2022-01-02 DIAGNOSIS — K831 Obstruction of bile duct: Secondary | ICD-10-CM | POA: Diagnosis not present

## 2022-01-02 DIAGNOSIS — Z9884 Bariatric surgery status: Secondary | ICD-10-CM | POA: Diagnosis not present

## 2022-01-02 DIAGNOSIS — R978 Other abnormal tumor markers: Secondary | ICD-10-CM | POA: Diagnosis not present

## 2022-01-02 DIAGNOSIS — Z538 Procedure and treatment not carried out for other reasons: Secondary | ICD-10-CM | POA: Diagnosis not present

## 2022-01-02 DIAGNOSIS — K9189 Other postprocedural complications and disorders of digestive system: Secondary | ICD-10-CM | POA: Diagnosis not present

## 2022-01-02 DIAGNOSIS — Z95818 Presence of other cardiac implants and grafts: Secondary | ICD-10-CM | POA: Diagnosis not present

## 2022-01-02 MED ORDER — HYDROCODONE-ACETAMINOPHEN 5-325 MG PO TABS: 1.0000 | ORAL_TABLET | Freq: Four times a day (QID) | ORAL | 0 refills | Status: DC | PRN

## 2022-01-10 ENCOUNTER — Telehealth: Payer: Self-pay

## 2022-01-10 NOTE — Telephone Encounter (Signed)
Pt's spouse called in concerned about pt's recent lab work that was done at the [place]. Please give pt or pt's wife a cb about results. Please advise. ? ?Cb#: [phone] ?

## 2022-01-10 NOTE — Telephone Encounter (Signed)
Call pt back and advice that we are not able to view lab results from providers/health facilities ie [place]. Suggest for pt to call their [place] provider.  ?

## 2022-02-06 DIAGNOSIS — I2510 Atherosclerotic heart disease of native coronary artery without angina pectoris: Secondary | ICD-10-CM | POA: Diagnosis not present

## 2022-02-06 DIAGNOSIS — Z9049 Acquired absence of other specified parts of digestive tract: Secondary | ICD-10-CM | POA: Diagnosis not present

## 2022-02-06 DIAGNOSIS — K219 Gastro-esophageal reflux disease without esophagitis: Secondary | ICD-10-CM | POA: Diagnosis not present

## 2022-02-06 DIAGNOSIS — S3613XD Injury of bile duct, subsequent encounter: Secondary | ICD-10-CM | POA: Diagnosis not present

## 2022-02-06 DIAGNOSIS — E785 Hyperlipidemia, unspecified: Secondary | ICD-10-CM | POA: Diagnosis not present

## 2022-02-06 DIAGNOSIS — I10 Essential (primary) hypertension: Secondary | ICD-10-CM | POA: Diagnosis not present

## 2022-03-19 ENCOUNTER — Encounter (HOSPITAL_COMMUNITY): Payer: Self-pay | Admitting: Emergency Medicine

## 2022-03-19 ENCOUNTER — Emergency Department (HOSPITAL_COMMUNITY): Payer: No Typology Code available for payment source

## 2022-03-19 ENCOUNTER — Inpatient Hospital Stay (HOSPITAL_COMMUNITY)
Admission: EM | Admit: 2022-03-19 | Discharge: 2022-03-20 | DRG: 872 | Disposition: A | Discharge status: Other Acute Inpatient Hospital | Payer: No Typology Code available for payment source | Attending: Internal Medicine | Admitting: Internal Medicine

## 2022-03-19 DIAGNOSIS — R7401 Elevation of levels of liver transaminase levels: Secondary | ICD-10-CM | POA: Diagnosis present

## 2022-03-19 DIAGNOSIS — K8309 Other cholangitis: Secondary | ICD-10-CM | POA: Diagnosis not present

## 2022-03-19 DIAGNOSIS — I701 Atherosclerosis of renal artery: Secondary | ICD-10-CM | POA: Diagnosis not present

## 2022-03-19 DIAGNOSIS — A419 Sepsis, unspecified organism: Principal | ICD-10-CM

## 2022-03-19 DIAGNOSIS — Z7982 Long term (current) use of aspirin: Secondary | ICD-10-CM

## 2022-03-19 DIAGNOSIS — Z9049 Acquired absence of other specified parts of digestive tract: Secondary | ICD-10-CM | POA: Diagnosis not present

## 2022-03-19 DIAGNOSIS — D696 Thrombocytopenia, unspecified: Secondary | ICD-10-CM | POA: Diagnosis present

## 2022-03-19 DIAGNOSIS — R7989 Other specified abnormal findings of blood chemistry: Secondary | ICD-10-CM | POA: Diagnosis not present

## 2022-03-19 DIAGNOSIS — H9190 Unspecified hearing loss, unspecified ear: Secondary | ICD-10-CM | POA: Diagnosis present

## 2022-03-19 DIAGNOSIS — K9189 Other postprocedural complications and disorders of digestive system: Secondary | ICD-10-CM | POA: Diagnosis not present

## 2022-03-19 DIAGNOSIS — R6520 Severe sepsis without septic shock: Secondary | ICD-10-CM

## 2022-03-19 DIAGNOSIS — I10 Essential (primary) hypertension: Secondary | ICD-10-CM | POA: Diagnosis not present

## 2022-03-19 DIAGNOSIS — I2510 Atherosclerotic heart disease of native coronary artery without angina pectoris: Secondary | ICD-10-CM | POA: Diagnosis present

## 2022-03-19 DIAGNOSIS — E872 Acidosis, unspecified: Secondary | ICD-10-CM | POA: Diagnosis not present

## 2022-03-19 DIAGNOSIS — Z79899 Other long term (current) drug therapy: Secondary | ICD-10-CM

## 2022-03-19 DIAGNOSIS — E7800 Pure hypercholesterolemia, unspecified: Secondary | ICD-10-CM | POA: Diagnosis present

## 2022-03-19 DIAGNOSIS — Z87891 Personal history of nicotine dependence: Secondary | ICD-10-CM | POA: Diagnosis not present

## 2022-03-19 DIAGNOSIS — I774 Celiac artery compression syndrome: Secondary | ICD-10-CM | POA: Diagnosis not present

## 2022-03-19 DIAGNOSIS — Z0389 Encounter for observation for other suspected diseases and conditions ruled out: Secondary | ICD-10-CM | POA: Diagnosis not present

## 2022-03-19 DIAGNOSIS — Z20822 Contact with and (suspected) exposure to covid-19: Secondary | ICD-10-CM | POA: Diagnosis present

## 2022-03-19 DIAGNOSIS — I959 Hypotension, unspecified: Secondary | ICD-10-CM | POA: Diagnosis present

## 2022-03-19 DIAGNOSIS — K831 Obstruction of bile duct: Secondary | ICD-10-CM | POA: Diagnosis not present

## 2022-03-19 DIAGNOSIS — E785 Hyperlipidemia, unspecified: Secondary | ICD-10-CM | POA: Diagnosis not present

## 2022-03-19 DIAGNOSIS — Z955 Presence of coronary angioplasty implant and graft: Secondary | ICD-10-CM

## 2022-03-19 DIAGNOSIS — N433 Hydrocele, unspecified: Secondary | ICD-10-CM | POA: Diagnosis not present

## 2022-03-19 DIAGNOSIS — D6489 Other specified anemias: Secondary | ICD-10-CM | POA: Diagnosis present

## 2022-03-19 DIAGNOSIS — A4150 Gram-negative sepsis, unspecified: Secondary | ICD-10-CM | POA: Diagnosis present

## 2022-03-19 DIAGNOSIS — B9629 Other Escherichia coli [E. coli] as the cause of diseases classified elsewhere: Secondary | ICD-10-CM | POA: Diagnosis not present

## 2022-03-19 DIAGNOSIS — I252 Old myocardial infarction: Secondary | ICD-10-CM | POA: Diagnosis not present

## 2022-03-19 DIAGNOSIS — R188 Other ascites: Secondary | ICD-10-CM | POA: Diagnosis not present

## 2022-03-19 DIAGNOSIS — K219 Gastro-esophageal reflux disease without esophagitis: Secondary | ICD-10-CM | POA: Diagnosis present

## 2022-03-19 DIAGNOSIS — Z8249 Family history of ischemic heart disease and other diseases of the circulatory system: Secondary | ICD-10-CM

## 2022-03-19 DIAGNOSIS — R7881 Bacteremia: Secondary | ICD-10-CM | POA: Diagnosis not present

## 2022-03-19 DIAGNOSIS — R509 Fever, unspecified: Secondary | ICD-10-CM | POA: Diagnosis present

## 2022-03-19 DIAGNOSIS — K769 Liver disease, unspecified: Secondary | ICD-10-CM | POA: Diagnosis not present

## 2022-03-19 LAB — CBC WITH DIFFERENTIAL/PLATELET
Abs Immature Granulocytes: 0 10*3/uL (ref 0.00–0.07)
Basophils Absolute: 0.1 10*3/uL (ref 0.0–0.1)
Basophils Relative: 1 %
Eosinophils Absolute: 0.1 10*3/uL (ref 0.0–0.5)
Eosinophils Relative: 1 %
HCT: 35.7 % — ABNORMAL LOW (ref 39.0–52.0)
Hemoglobin: 10.7 g/dL — ABNORMAL LOW (ref 13.0–17.0)
Lymphocytes Relative: 16 %
Lymphs Abs: 0.9 10*3/uL (ref 0.7–4.0)
MCH: 26.6 pg (ref 26.0–34.0)
MCHC: 30 g/dL (ref 30.0–36.0)
MCV: 88.6 fL (ref 80.0–100.0)
Monocytes Absolute: 0 10*3/uL — ABNORMAL LOW (ref 0.1–1.0)
Monocytes Relative: 0 %
Neutro Abs: 4.6 10*3/uL (ref 1.7–7.7)
Neutrophils Relative %: 82 %
Platelets: 159 10*3/uL (ref 150–400)
RBC: 4.03 MIL/uL — ABNORMAL LOW (ref 4.22–5.81)
RDW: 16.1 % — ABNORMAL HIGH (ref 11.5–15.5)
WBC: 5.6 10*3/uL (ref 4.0–10.5)
nRBC: 0 % (ref 0.0–0.2)
nRBC: 0 /100 WBC

## 2022-03-19 LAB — URINALYSIS, ROUTINE W REFLEX MICROSCOPIC
Bilirubin Urine: NEGATIVE
Glucose, UA: NEGATIVE mg/dL
Hgb urine dipstick: NEGATIVE
Ketones, ur: NEGATIVE mg/dL
Leukocytes,Ua: NEGATIVE
Nitrite: NEGATIVE
Protein, ur: 30 mg/dL — AB
Specific Gravity, Urine: >1.046 — ABNORMAL HIGH (ref 1.005–1.030)
pH: 5 (ref 5.0–8.0)

## 2022-03-19 LAB — COMPREHENSIVE METABOLIC PANEL
ALT: 103 U/L — ABNORMAL HIGH (ref 0–44)
AST: 187 U/L — ABNORMAL HIGH (ref 15–41)
Albumin: 2.9 g/dL — ABNORMAL LOW (ref 3.5–5.0)
Alkaline Phosphatase: 833 U/L — ABNORMAL HIGH (ref 38–126)
Anion gap: 21 — ABNORMAL HIGH (ref 5–15)
BUN: 19 mg/dL (ref 8–23)
CO2: 17 mmol/L — ABNORMAL LOW (ref 22–32)
Calcium: 9.2 mg/dL (ref 8.9–10.3)
Chloride: 106 mmol/L (ref 98–111)
Creatinine, Ser: 0.99 mg/dL (ref 0.61–1.24)
GFR, Estimated: >60 mL/min (ref >60)
Glucose, Bld: 90 mg/dL (ref 70–99)
Potassium: 4.1 mmol/L (ref 3.5–5.1)
Sodium: 144 mmol/L (ref 135–145)
Total Bilirubin: 2.2 mg/dL — ABNORMAL HIGH (ref 0.3–1.2)
Total Protein: 7.3 g/dL (ref 6.5–8.1)

## 2022-03-19 LAB — PROTIME-INR
INR: 1.4 — ABNORMAL HIGH (ref 0.8–1.2)
Prothrombin Time: 17.3 seconds — ABNORMAL HIGH (ref 11.4–15.2)

## 2022-03-19 LAB — LACTIC ACID, PLASMA
Lactic Acid, Venous: >9 mmol/L (ref 0.5–1.9)
Lactic Acid, Venous: 6 mmol/L (ref 0.5–1.9)

## 2022-03-19 LAB — SARS CORONAVIRUS 2 BY RT PCR: SARS Coronavirus 2 by RT PCR: NEGATIVE

## 2022-03-19 MED ORDER — LACTATED RINGERS IV SOLN: INTRAVENOUS | Status: AC

## 2022-03-19 MED ORDER — VANCOMYCIN HCL IN DEXTROSE 1-5 GM/200ML-% IV SOLN
1000.0000 mg | Freq: Once | INTRAVENOUS | Status: DC
  Filled 2022-03-19: qty 200

## 2022-03-19 MED ORDER — LACTATED RINGERS IV BOLUS (SEPSIS)
1000.0000 mL | Freq: Once | INTRAVENOUS | Status: AC
  Administered 2022-03-19: 1000 mL via INTRAVENOUS

## 2022-03-19 MED ORDER — LIDOCAINE HCL URETHRAL/MUCOSAL 2 % EX GEL
1.0000 | Freq: Once | CUTANEOUS | Status: DC
  Filled 2022-03-19: qty 11

## 2022-03-19 MED ORDER — VANCOMYCIN HCL 1250 MG/250ML IV SOLN
1250.0000 mg | Freq: Once | INTRAVENOUS | Status: AC
  Administered 2022-03-19: 1250 mg via INTRAVENOUS
  Filled 2022-03-19: qty 250

## 2022-03-19 MED ORDER — SODIUM CHLORIDE 0.9% FLUSH
3.0000 mL | Freq: Two times a day (BID) | INTRAVENOUS | Status: DC
  Administered 2022-03-20 (×2): 3 mL via INTRAVENOUS

## 2022-03-19 MED ORDER — IOHEXOL 350 MG/ML SOLN
80.0000 mL | Freq: Once | INTRAVENOUS | Status: AC | PRN
  Administered 2022-03-19: 80 mL via INTRAVENOUS

## 2022-03-19 MED ORDER — VANCOMYCIN HCL IN DEXTROSE 1-5 GM/200ML-% IV SOLN: 1000.0000 mg | INTRAVENOUS | Status: DC

## 2022-03-19 MED ORDER — ONDANSETRON HCL 4 MG/2ML IJ SOLN: 4.0000 mg | Freq: Four times a day (QID) | INTRAMUSCULAR | Status: DC | PRN

## 2022-03-19 MED ORDER — CEFEPIME HCL 2 G IV SOLR: 2.0000 g | Freq: Two times a day (BID) | INTRAVENOUS | Status: DC

## 2022-03-19 MED ORDER — PIPERACILLIN-TAZOBACTAM 3.375 G IVPB
3.3750 g | Freq: Three times a day (TID) | INTRAVENOUS | Status: DC
  Administered 2022-03-20: 3.375 g via INTRAVENOUS
  Filled 2022-03-19: qty 50

## 2022-03-19 MED ORDER — IBUPROFEN 400 MG PO TABS
600.0000 mg | ORAL_TABLET | Freq: Once | ORAL | Status: AC
  Administered 2022-03-19: 600 mg via ORAL
  Filled 2022-03-19: qty 1

## 2022-03-19 MED ORDER — ONDANSETRON HCL 4 MG PO TABS: 4.0000 mg | ORAL_TABLET | Freq: Four times a day (QID) | ORAL | Status: DC | PRN

## 2022-03-19 MED ORDER — SODIUM CHLORIDE 0.9 % IV SOLN
2.0000 g | Freq: Once | INTRAVENOUS | Status: AC
Start: 2022-03-19 — End: 2022-03-19
  Administered 2022-03-19: 2 g via INTRAVENOUS
  Filled 2022-03-19: qty 12.5

## 2022-03-19 MED ORDER — METRONIDAZOLE 500 MG/100ML IV SOLN
500.0000 mg | Freq: Once | INTRAVENOUS | Status: AC
  Administered 2022-03-19: 500 mg via INTRAVENOUS
  Filled 2022-03-19: qty 100

## 2022-03-19 NOTE — Progress Notes (Addendum)
Pharmacy Antibiotic Note  Randy Thomas is a 86 y.o. male admitted on 03/19/2022 presenting with fevers and abdominal pain.  Pharmacy has been consulted for vancomycin and cefepime dosing.  Flagyl per MD  Plan: Vancomycin 1250 mg IV x 1, then 1000 mg IV every 24 hours (eAUC 487, SCr 0.99) Cefepime 2g IV every 12 hours Monitor renal function, Cx and clinical progression to narrow Vancomycin levels as needed  Addendum: Now to narrow to Zosyn 3.375g IV q 8h (extended 4h infusion)  Weight: 62.3 kg (137 lb 5.6 oz)  Temp (24hrs), Avg:100.8 F (38.2 C), Min:98 F (36.7 C), Max:102.8 F (39.3 C)  Recent Labs  Lab 03/19/22 1300 03/19/22 1330 03/19/22 1450  WBC 5.6  --   --   CREATININE 0.99  --   --   LATICACIDVEN  --  >9.0* 6.0*    Estimated Creatinine Clearance: 49.8 mL/min (by C-G formula based on SCr of 0.99 mg/dL).    No Known Allergies  Bertis Ruddy, PharmD Clinical Pharmacist ED Pharmacist Phone # [phone] 03/19/2022 4:46 PM

## 2022-03-19 NOTE — H&P (Incomplete)
History and Physical    DELL BRINER DDU:[id number] DOB: 03-Sep-1936 DOA: 03/19/2022  PCP: Susy Frizzle, MD  Patient coming from: ***  I have personally briefly reviewed patient's old medical records in [place]  Chief Complaint: ***  HPI: Randy Thomas is a 86 y.o. male with medical history significant for ***   ED Course  Labs/Imaging on admission: I have personally reviewed following labs and imaging studies.  ***  Review of Systems: All systems reviewed and are negative except as documented in history of present illness above.   Past Medical History:  Diagnosis Date   Allergy    GERD (gastroesophageal reflux disease)    Hard of hearing    Hypercholesteremia    Hyperlipidemia    Hypertension    Myocardial infarction ([place])    X 2 stents    Past Surgical History:  Procedure Laterality Date   CARDIAC CATHETERIZATION     X 2 stents   CHOLECYSTECTOMY N/A 05/20/2020   Procedure: LAPAROSCOPIC CHOLECYSTECTOMY;  Surgeon: [name], MD;  Location: [facility];  Service: General;  Laterality: N/A;   ERCP N/A 05/18/2020   Procedure: ENDOSCOPIC RETROGRADE CHOLANGIOPANCREATOGRAPHY (ERCP) ;  Surgeon: Rogene Houston, MD;  Location[facility];  Service: Endoscopy;  Laterality: N/A;   ERCP N/A 05/20/2020   Procedure: ENDOSCOPIC RETROGRADE CHOLANGIOPANCREATOGRAPHY (ERCP);  Surgeon: [name], MD;  Location: [facility];  Service: General;  Laterality: N/A;   [place] wore cast   SPHINCTEROTOMY  05/18/2020   Procedure: SPHINCTEROTOMY mild;  Surgeon: Rogene Houston, MD;  Location: [facility];  Service: Endoscopy;;   TONSILLECTOMY     as a child    Social History:  reports that he quit smoking about 40 years ago. His smoking use included cigarettes. He has never used smokeless tobacco. He reports that he does not drink alcohol and does not use drugs.  No Known Allergies  Family History  Problem Relation Age of Onset   Asthma Mother     Hearing loss Mother    Heart disease Father    Diabetes Brother    Diabetes Maternal Grandmother    Colon cancer Neg Hx    Colon polyps Neg Hx      Prior to Admission medications   Medication Sig Start Date End Date Taking? Authorizing Provider  aspirin EC 81 MG tablet Take 1 tablet (81 mg total) by mouth daily. 11/19/16  Yes Dena Billet B, PA-C  atorvastatin (LIPITOR) 40 MG tablet Take 40 mg by mouth at bedtime.   Yes [provider]  cetirizine (ZYRTEC) 10 MG tablet TAKE 1 TABLET(10 MG) BY MOUTH DAILY Patient taking differently: Take 10 mg by mouth daily. 05/23/20  Yes Susy Frizzle, MD  Cholecalciferol (VITAMIN D3 SUPER STRENGTH) 50 MCG (2000 UT) CAPS Take 2,000 Units by mouth daily.   Yes [provider]  ferrous sulfate (FERROUSUL) 325 (65 FE) MG tablet Take 325 mg by mouth daily with breakfast.   Yes [provider]  methocarbamol (ROBAXIN) 750 MG tablet Take 1 tablet (750 mg total) by mouth 4 (four) times daily. Patient taking differently: Take 750 mg by mouth 2 (two) times daily as needed for muscle spasms. 05/03/19  Yes Susy Frizzle, MD  metoprolol tartrate (LOPRESSOR) 50 MG tablet Take 25 mg by mouth daily.   Yes [provider]  Multiple Vitamin (MULTIVITAMIN WITH MINERALS) TABS tablet Take 1 tablet by mouth daily.  Patient taking differently: Take 1 tablet by mouth daily with breakfast. 11/19/16  Yes Dena Billet B, PA-C  nitroGLYCERIN (NITROSTAT) 0.4 MG SL tablet Place 0.4 mg under the tongue every 5 (five) minutes as needed for chest pain. 02/16/20  Yes [provider]  Omega 3 1000 MG CAPS Take 2,000 mg by mouth daily.   Yes [provider]  pantoprazole (PROTONIX) 40 MG tablet Take 40 mg by mouth daily before breakfast.   Yes [provider]  VASCEPA 1 g capsule Take 1 capsule by mouth in the morning and at bedtime. 03/12/20   [provider]  vitamin B-12 (CYANOCOBALAMIN) 500 MCG tablet Take 500 mcg by  mouth daily.   Yes [provider]  cholecalciferol (VITAMIN D) 1000 units tablet Take 1 tablet (1,000 Units total) by mouth daily. Patient not taking: Reported on 03/19/2022 11/19/16   Orlena Sheldon, PA-C  HYDROcodone-acetaminophen (NORCO/VICODIN) 5-325 MG tablet Take 1 tablet by mouth every 6 (six) hours as needed. 01/02/22   Susy Frizzle, MD  metoprolol succinate (TOPROL-XL) 50 MG 24 hr tablet TAKE 1/2 TABLET BY MOUTH IN THE MORNING AND 1/2 TABLET BY MOUTH IN THE EVENING Patient not taking: Reported on 03/19/2022 11/19/16   Orlena Sheldon, PA-C    Physical Exam: Vitals:   03/19/22 1845 03/19/22 1900 03/19/22 1918 03/19/22 2030  BP: (!) 101/44 (!) 102/47  (!) 112/57  Pulse: 81 83  82  Resp:  20  16  Temp:   98 F (36.7 C)   TempSrc:   Oral   SpO2: 97% 98%  100%  Weight:       *** Constitutional: NAD, calm, comfortable Eyes: PERRL, lids and conjunctivae normal ENMT: Mucous membranes are moist. Posterior pharynx clear of any exudate or lesions.Normal dentition.  Neck: normal, supple, no masses. Respiratory: clear to auscultation bilaterally, no wheezing, no crackles. Normal respiratory effort. No accessory muscle use.  Cardiovascular: Regular rate and rhythm, no murmurs / rubs / gallops. No extremity edema. 2+ pedal pulses. Abdomen: no tenderness, no masses palpated. No hepatosplenomegaly. Bowel sounds positive.  Musculoskeletal: no clubbing / cyanosis. No joint deformity upper and lower extremities. Good ROM, no contractures. Normal muscle tone.  Skin: no rashes, lesions, ulcers. No induration Neurologic: CN 2-12 grossly intact. Sensation intact. Strength 5/5 in all 4.  Psychiatric: Normal judgment and insight. Alert and oriented x 3. Normal mood.   EKG: Personally reviewed. ***  Assessment/Plan Active Problems:   * No active hospital problems. *   *** No notes on file *** Assessment and Plan: No notes have been filed under this hospital service. Service:  Hospitalist      DVT prophylaxis: ***  Code Status: ***  Family Communication: ***  Disposition Plan: ***  Consults called: ***  Severity of Illness: {Observation/Inpatient:21159}  Zada Finders MD [facility]  If 7PM-7AM, please contact night-coverage [email or web address]  03/19/2022, 9:01 PM

## 2022-03-19 NOTE — ED Provider Triage Note (Signed)
Emergency Medicine Provider Triage Evaluation Note  Randy Thomas , a 86 y.o. male  was evaluated in triage.  Pt complains of fevers with Tmax 102. Family member at bedside states that patient has been bacteremic in the past from postop complications from cholecystectomy several years ago.  He has had fevers and chills, shivering for about 1 month.  Also complaining of abdominal pain.  Review of Systems  Positive: Fevers, abdominal pain Negative: Chest pain  Physical Exam  BP 131/77 (BP Location: Right Arm)   Pulse (!) 115   Temp 98 F (36.7 C) (Oral)   Resp 18   SpO2 100%  Gen:   Awake, no distress   Resp:  Normal effort  MSK:   Moves extremities without difficulty  Other:    Medical Decision Making  Medically screening exam initiated at 12:19 PM.  Appropriate orders placed.  Randy Thomas was informed that the remainder of the evaluation will be completed by another provider, this initial triage assessment does not replace that evaluation, and the importance of remaining in the ED until their evaluation is complete.  Work-up initiated   Delia Heady, [place] 03/19/22 1221

## 2022-03-19 NOTE — ED Triage Notes (Addendum)
Patient sent to ED from PCP for evaluation of intermittent fevers that have reportedly been as high as 102.87F. Patient reports intermittent abdominal pain and feeling very cold and is currently shivering, denies pain at this time.

## 2022-03-19 NOTE — Assessment & Plan Note (Addendum)
Acute cholangitis Hx type B for bile duct injury s/p Roux-en-Y hepaticojejunostomy 05/21/2020 Elevated LFTs Presenting with fevers, tachycardia, hypotension, lactic acidosis.  Clinical history and imaging concerning for cholangitis, suspected due to stricture at biliary anastomosis.  Anatomy not amenable to ERCP.  Accepted for transfer to [facility] for hepatopancreatic surgery service however no beds available.  Recommendation was for IR guided PTC and admission with continued antibiotics pending transfer availability. -Continue IV Zosyn -Follow blood cultures -N.p.o. after midnight for IR PTC in a.m. -Continue IV fluid hydration overnight -Transfer to [facility] pending bed availability

## 2022-03-19 NOTE — Hospital Course (Signed)
Randy Thomas is a 86 y.o. male with medical history significant for CAD, HTN, HLD, gallstone pancreatitis, laparoscopic cholecystectomy complicated by CBD transection requiring emergent duct reconstruction with Roux-en-Y hepaticojejunostomy 05/21/2020 who is admitted with acute cholangitis in setting of suspected biliary anastomosis stricture.  Started on IV antibiotics with plan for IR guided PTC pending bed availability for transfer to [facility].

## 2022-03-19 NOTE — ED Provider Notes (Addendum)
86 yo male here with sepsis, lactate 9 -> 6 after fluids Presented with fever and malaise RUQ tenderness on exam Hx of gallstone pancreatitis s/p cholecystectomy  S/p LR and Vanco, Flagyl, and Cefepime  *  CT scan reviewed and interpreted, agree with radiologist, findings would be most consistent clinically for acute cholangitis with the lab abnormalities.  Patient remains with good mentation, blood pressure has been stable.  No evidence of shock.  Lactate improving with fluids.  I believe he is reasonably stable for medical admission at this time.  Appropriate antibiotics have been ordered.  Clinical Course as of 03/19/22 2103  Wed Mar 19, 2022  1256 Temp(!): 101.7 F (38.7 C) [JL]  1256 Pulse Rate(!): 115 [JL]  1559 Lactic Acid, Venous(!!): >9.0 [JL]  1721 CT imaging concerning for possible cholangitis.  Patient already receiving broad-spectrum antibiotics.  Anticipate medical admission [MT]  1918 I spoke to GI attending Dr Hilarie Fredrickson who reviewed the patient's complicated medical chart, including failed ERCP at [facility] due to anastomotic stricture, and recommends that we attempt to transfer the patient back to [facility], and so I am currently reaching out to the transfer center.  He agreed with broad-spectrum antibiotics, and recommended if the patient were to be boarding in the ED or the hospital pending transfer, consideration of IR consultation for PTC. [MT]  2022 I spoke to Dr Maryelizabeth Kaufmann from [place] who recommended PTC, and discussion with hepatopancreatic surgery service at [facility] - they are contacting this service for me [MT]  2047 I spoke to Dr Maryelizabeth Kaufmann from IR who advised the patient made n.p.o. at midnight for PTC, which would be placed in the morning.  The patient has been accepted onto a wait list after I spoke to their surgeon at [facility] [place], who agrees with plan for [facility] and antibiotics overnight until the patient could be transferred for surgical evaluation. [MT]  2102  Admitted to hospitalist Dr Posey Pronto with plan for [facility] in the AM, IV antibiotics overnight, NPO at midnight, and eventual transfer to [facility] for specialty surgery evaluation.  Per GI consultants, bile duct stricture is likely not amenable to further ERCP and would need surgical consideration. [MT]    Clinical Course User Index [JL] Regan Lemming, MD [MT] Langston Masker Carola Rhine, MD      Wyvonnia Dusky, MD 03/19/22 1759    Wyvonnia Dusky, MD 03/19/22 2049    Wyvonnia Dusky, MD 03/19/22 2103

## 2022-03-19 NOTE — ED Notes (Signed)
Back from CT, no changes, alert, NAD, calm, interactive, talkative, joking around, denies pain. Family at [facility].

## 2022-03-19 NOTE — ED Notes (Signed)
MD Posey Pronto notified of pt diastolic <19 - Plan to continue to monitor for now

## 2022-03-19 NOTE — Assessment & Plan Note (Signed)
Hold home Lopressor in setting of hypotension.

## 2022-03-19 NOTE — ED Notes (Signed)
Pt given [place] sandwich bag and juice at this time - notified he would be NPO at midnight

## 2022-03-19 NOTE — ED Provider Notes (Signed)
[facility] EMERGENCY DEPARTMENT Provider Note   CSN: [id number] Arrival date & time: 03/19/22  1143     History  Chief Complaint  Patient presents with   Fever    AZARIAS CHIOU is a 86 y.o. male.   Fever Associated symptoms: chills     86 year old male with medical history significant for MI, HTN, HLD, GERD, recent laparoscopic cholecystectomy in the past year with subsequent bile duct injury of the common bile duct, previous bacteremia and sepsis who presents to the emergency department with concern for fever that have been intermittent.  Patient states that he feels chills, denies any significant pain on arrival.  He states that he has had intermittent abdominal pain.  His temperature at home has been in up to 102.4.  He denies any nausea or vomiting.  He denies any dysuria or diarrhea.  He denies any neck pain or stiffness.  Denies any headaches.  He denies any rash or other complaint.  Home Medications Prior to Admission medications   Medication Sig Start Date End Date Taking? Authorizing Provider  aspirin EC 81 MG tablet Take 1 tablet (81 mg total) by mouth daily. 11/19/16  Yes Dena Billet B, PA-C  atorvastatin (LIPITOR) 40 MG tablet Take 40 mg by mouth at bedtime.   Yes [provider]  cetirizine (ZYRTEC) 10 MG tablet TAKE 1 TABLET(10 MG) BY MOUTH DAILY Patient taking differently: Take 10 mg by mouth daily. 05/23/20  Yes Susy Frizzle, MD  Cholecalciferol (VITAMIN D3 SUPER STRENGTH) 50 MCG (2000 UT) CAPS Take 2,000 Units by mouth daily.   Yes [provider]  ferrous sulfate (FERROUSUL) 325 (65 FE) MG tablet Take 325 mg by mouth daily with breakfast.   Yes [provider]  methocarbamol (ROBAXIN) 750 MG tablet Take 1 tablet (750 mg total) by mouth 4 (four) times daily. Patient taking differently: Take 750 mg by mouth 2 (two) times daily as needed for muscle spasms. 05/03/19  Yes Susy Frizzle, MD  metoprolol tartrate  (LOPRESSOR) 50 MG tablet Take 25 mg by mouth daily.   Yes [provider]  Multiple Vitamin (MULTIVITAMIN WITH MINERALS) TABS tablet Take 1 tablet by mouth daily. Patient taking differently: Take 1 tablet by mouth daily with breakfast. 11/19/16  Yes Dena Billet B, PA-C  nitroGLYCERIN (NITROSTAT) 0.4 MG SL tablet Place 0.4 mg under the tongue every 5 (five) minutes as needed for chest pain. 02/16/20  Yes [provider]  Omega 3 1000 MG CAPS Take 2,000 mg by mouth daily.   Yes [provider]  pantoprazole (PROTONIX) 40 MG tablet Take 40 mg by mouth daily before breakfast.   Yes [provider]  VASCEPA 1 g capsule Take 1 capsule by mouth in the morning and at bedtime. 03/12/20   [provider]  vitamin B-12 (CYANOCOBALAMIN) 500 MCG tablet Take 500 mcg by mouth daily.   Yes [provider]  cholecalciferol (VITAMIN D) 1000 units tablet Take 1 tablet (1,000 Units total) by mouth daily. Patient not taking: Reported on 03/19/2022 11/19/16   Orlena Sheldon, PA-C  HYDROcodone-acetaminophen (NORCO/VICODIN) 5-325 MG tablet Take 1 tablet by mouth every 6 (six) hours as needed. 01/02/22   Susy Frizzle, MD  metoprolol succinate (TOPROL-XL) 50 MG 24 hr tablet TAKE 1/2 TABLET BY MOUTH IN THE MORNING AND 1/2 TABLET BY MOUTH IN THE EVENING Patient not taking: Reported on 03/19/2022 11/19/16   Orlena Sheldon, PA-C  Allergies    Patient has no known allergies.    Review of Systems   Review of Systems  Constitutional:  Positive for chills and fever.  Gastrointestinal:  Positive for abdominal pain.  All other systems reviewed and are negative.   Physical Exam Updated Vital Signs BP (!) 101/44   Pulse 81   Temp (!) 102.8 F (39.3 C) (Oral)   Resp 20   Wt 62.3 kg   SpO2 97%   BMI 21.51 kg/m  Physical Exam Vitals and nursing note reviewed. Exam conducted with a chaperone present.  Constitutional:      General: He is not in acute distress.     Appearance: He is well-developed.  HENT:     Head: Normocephalic and atraumatic.  Eyes:     Conjunctiva/sclera: Conjunctivae normal.  Cardiovascular:     Rate and Rhythm: Regular rhythm. Tachycardia present.  Pulmonary:     Effort: Pulmonary effort is normal. No respiratory distress.     Breath sounds: Normal breath sounds.  Abdominal:     Palpations: Abdomen is soft.     Tenderness: There is abdominal tenderness in the right upper quadrant. There is no guarding or rebound.  Genitourinary:    Comments: Possible hydrocele present, no clear hernia, no rashes or given lesion Musculoskeletal:        General: No swelling.     Cervical back: Neck supple.  Skin:    General: Skin is warm and dry.     Capillary Refill: Capillary refill takes less than 2 seconds.  Neurological:     Mental Status: He is alert.  Psychiatric:        Mood and Affect: Mood normal.     ED Results / Procedures / Treatments   Labs (all labs ordered are listed, but only abnormal results are displayed) Labs Reviewed  COMPREHENSIVE METABOLIC PANEL - Abnormal; Notable for the following components:      Result Value   CO2 17 (*)    Albumin 2.9 (*)    AST 187 (*)    ALT 103 (*)    Alkaline Phosphatase 833 (*)    Total Bilirubin 2.2 (*)    Anion gap 21 (*)    All other components within normal limits  LACTIC ACID, PLASMA - Abnormal; Notable for the following components:   Lactic Acid, Venous >9.0 (*)    All other components within normal limits  LACTIC ACID, PLASMA - Abnormal; Notable for the following components:   Lactic Acid, Venous 6.0 (*)    All other components within normal limits  CBC WITH DIFFERENTIAL/PLATELET - Abnormal; Notable for the following components:   RBC 4.03 (*)    Hemoglobin 10.7 (*)    HCT 35.7 (*)    RDW 16.1 (*)    Monocytes Absolute 0.0 (*)    All other components within normal limits  PROTIME-INR - Abnormal; Notable for the following components:   Prothrombin Time 17.3 (*)     INR 1.4 (*)    All other components within normal limits  SARS CORONAVIRUS 2 BY RT PCR  CULTURE, BLOOD (ROUTINE X 2)  CULTURE, BLOOD (ROUTINE X 2)  URINALYSIS, ROUTINE W REFLEX MICROSCOPIC    EKG None  Radiology CT Angio Abd/Pel W and/or Wo Contrast  Result Date: 03/19/2022 CLINICAL DATA:  86 year old male with a history of prior bile duct injury during cholecystectomy requiring reconstruction with hepaticojejunostomy (2 right ducts, 1 left duct) performed at [facility] 05/21/2020 Patient now presents with possible  infection, abdominal pain intermittent fevers, and concern for mesenteric ischemia EXAM: CTA ABDOMEN AND PELVIS WITHOUT AND WITH CONTRAST TECHNIQUE: Multidetector CT imaging of the abdomen and pelvis was performed using the standard protocol during bolus administration of intravenous contrast. Multiplanar reconstructed images and MIPs were obtained and reviewed to evaluate the vascular anatomy. RADIATION DOSE REDUCTION: This exam was performed according to the departmental dose-optimization program which includes automated exposure control, adjustment of the mA and/or kV according to patient size and/or use of iterative reconstruction technique. CONTRAST:  7m OMNIPAQUE IOHEXOL 350 MG/ML SOLN COMPARISON:  11/07/2020, 05/18/2020, 08/22/2019 FINDINGS: VASCULAR Aorta: Unremarkable course, caliber, contour of the abdominal aorta. No dissection, aneurysm, or periaortic fluid. Mild to moderate atherosclerotic changes. No pedunculated plaque or ulcerated plaque. Celiac: Celiac artery patent with calcified plaque at the origin. On axial images degree of narrowing is estimated 50%. Branches remain patent with traditional contribution to the common hepatic artery, splenic artery, left gastric artery. SMA: SMA is patent with atherosclerotic changes at the origin contributing to estimated 50% narrowing on axial images. Branches are patent. Renals: - Right: Atherosclerotic changes at the origin of  the right renal artery with 50% or less narrowing. - Left: Atherosclerotic changes at the origin of the more cranial left renal artery. Estimated greater than 50% narrowing. The more caudal left renal artery demonstrates no evidence of high-grade stenosis. IMA: Inferior mesenteric artery is patent. Right lower extremity: Unremarkable course, caliber, and contour of the right iliac system. No aneurysm, dissection, or occlusion. Mild atherosclerotic changes. Hypogastric artery is patent. Common femoral artery patent. Proximal SFA and profunda femoris patent. Left lower extremity: Unremarkable course, caliber, and contour of the left iliac system. No aneurysm, dissection, or occlusion. Mild atherosclerotic changes. Hypogastric artery is patent. Common femoral artery patent. Proximal SFA and profunda femoris patent. Veins: Unremarkable appearance of the venous system. Review of the MIP images confirms the above findings. NON-VASCULAR Lower chest: Atelectatic changes at the lung bases. Bronchiectasis in the lower lobes bilaterally. Hepatobiliary: Surgical changes of hepaticojejunostomy for reported bile duct injury and associated cholecystectomy. Mild gas within the biliary ducts of both left and right-sided ducts. Intrahepatic biliary ductal dilatation of the left-sided ducts, though not significantly increased from the comparison CT of 11/07/2021. To a lesser degree, and similar to prior, the right-sided ducts are also dilated. Pancreas: Unremarkable pancreas. Spleen: Greatest span of the spleen measures more than 13 cm. Adrenals/Urinary Tract: - Right adrenal gland: Unremarkable - Left adrenal gland: Unremarkable. - Right kidney: No hydronephrosis, nephrolithiasis, inflammation, or ureteral dilation. No focal lesion. - Left Kidney: No hydronephrosis, nephrolithiasis, inflammation, or ureteral dilation. No focal lesion. - Urinary Bladder: Unremarkable. Stomach/Bowel: - Stomach: Unremarkable. - Small bowel: Surgical  changes of hepaticojejunostomy. No transition point. No focal wall thickening. No evidence of devascularization/ischemia. Mild fat edema within the right upper quadrant adjacent to the surgical site which was present on the comparison. - Appendix: Normal. - Colon: Left-sided colonic diverticular disease. No focal wall thickening or inflammatory changes. No evidence of bowel obstruction Lymphatic: Redemonstration of supra meso colic lymph nodes within the gastrohepatic ligament, hilum of the liver, portacaval nodal station. Enlarging lymph node in the pericardial station/epiphrenic nodal station. This node now measures 17 mm, with the estimated size on the CT 12/01/2019 of approximately 9 mm. Similar size and configuration of multiple mesenteric lymph nodes throughout small bowel mesentery as well as retroperitoneal/para-aortic lymph nodes. Mesenteric: Trace low-density free fluid within the bilateral pericolic gutter, adjacent to the liver and spleen, and  layering dependently in the pelvis. Reproductive: Asymmetric hyperenhancement at the right base of the prostate. Right-sided hydrocele. Other: No hernia. Musculoskeletal: Multilevel degenerative changes of the spine. Intraosseous hemangioma of T12 is been present over time. No aggressive sclerotic or lucent lesions identified. No acute displaced fracture. IMPRESSION: CT angiogram does not support mesenteric ischemia as a cause of acute symptoms, as there is patency of all 3 mesenteric arteries with only estimated 50% narrowing of both celiac artery and SMA. Redemonstration of surgical changes of hepaticojejunostomy for treatment of prior bile duct injury. There are relatively similar changes of left greater than right intrahepatic biliary dilatation and pneumobilia, with mild increased edema/inflammatory changes at the hilum of the liver. Ascending cholangitis not excluded and correlation with lab values may be useful. Over time there is increasing size of lymph  nodes in the deep liver drainage pathway (epiphrenic/pericardial, portal caval, liver hilum, gastrohepatic ligament). Correlation with pancreaticobiliary tumor markers may be useful if there is concern for occult malignancy. Otherwise these may be reactive in the setting of chronic inflammation. Asymmetric hyperenhancement at the right base of the prostate. Prostate cancer not excluded, and correlation with physical exam and PSA recommended. Trace free fluid throughout the abdomen, presumably reactive given the patient's clinical history. Aortic Atherosclerosis (ICD10-I70.0). Right-sided hydrocele. Signed, Dulcy Fanny. Nadene Rubins, RPVI [facility] Radiology Electronically Signed   By: Corrie Mckusick D.O.   On: 03/19/2022 17:17   DG Chest 2 View  Result Date: 03/19/2022 CLINICAL DATA:  Suspected sepsis EXAM: CHEST - 2 VIEW COMPARISON:  11/05/2021 FINDINGS: The heart size and mediastinal contours are within normal limits. Both lungs are clear. The visualized skeletal structures are unremarkable. IMPRESSION: No acute abnormality of the lungs. Electronically Signed   By: Delanna Ahmadi M.D.   On: 03/19/2022 12:31    Procedures .Critical Care  Performed by: Regan Lemming, MD Authorized by: Regan Lemming, MD   Critical care provider statement:    Critical care time (minutes):  30   Critical care was necessary to treat or prevent imminent or life-threatening deterioration of the following conditions:  Sepsis   Critical care was time spent personally by me on the following activities:  Development of treatment plan with patient or surrogate, discussions with consultants, evaluation of patient's response to treatment, examination of patient, ordering and review of laboratory studies, ordering and review of radiographic studies, ordering and performing treatments and interventions, pulse oximetry, re-evaluation of patient's condition and review of old charts      Medications Ordered in ED Medications  lactated ringers infusion ( Intravenous New Bag/Given 03/19/22 1453)  ceFEPIme (MAXIPIME) 2 g in sodium chloride 0.9 % 100 mL IVPB (has no administration in time range)  vancomycin (VANCOCIN) IVPB 1000 mg/200 mL premix (has no administration in time range)  lidocaine (XYLOCAINE) 2 % jelly 1 Application (1 Application Topical Not Given 03/19/22 1835)  ibuprofen (ADVIL) tablet 600 mg (600 mg Oral Given 03/19/22 1255)  lactated ringers bolus 1,000 mL (0 mLs Intravenous Stopped 03/19/22 1426)    And  lactated ringers bolus 1,000 mL (0 mLs Intravenous Stopped 03/19/22 1426)  ceFEPIme (MAXIPIME) 2 g in sodium chloride 0.9 % 100 mL IVPB (0 g Intravenous Stopped 03/19/22 1426)  metroNIDAZOLE (FLAGYL) IVPB 500 mg (0 mg Intravenous Stopped 03/19/22 1444)  vancomycin (VANCOREADY) IVPB 1250 mg/250 mL (0 mg Intravenous Stopped 03/19/22 1656)  iohexol (OMNIPAQUE) 350 MG/ML injection 80 mL (80 mLs Intravenous Contrast Given 03/19/22 1620)    ED  Course/ Medical Decision Making/ A&P Clinical Course as of 03/19/22 1856  Wed Mar 19, 2022  1256 Temp(!): 101.7 F (38.7 C) [JL]  1256 Pulse Rate(!): 115 [JL]  1559 Lactic Acid, Venous(!!): >9.0 [JL]  1721 CT imaging concerning for possible cholangitis.  Patient already receiving broad-spectrum antibiotics.  Anticipate medical admission [MT]    Clinical Course User Index [JL] Regan Lemming, MD [MT] Langston Masker Carola Rhine, MD                           Medical Decision Making Amount and/or Complexity of Data Reviewed Labs: ordered. Decision-making details documented in ED Course. Radiology: ordered.  Risk Prescription drug management. Decision regarding hospitalization.    86 year old male with medical history significant for MI, HTN, HLD, GERD, recent laparoscopic cholecystectomy in the past year with subsequent bile duct injury of the common bile duct, previous bacteremia and sepsis who presents to the emergency department with  concern for fever that have been intermittent.  Patient states that he feels chills, denies any significant pain on arrival.  He states that he has had intermittent abdominal pain.  His temperature at home has been in up to 102.4.  He denies any nausea or vomiting.  He denies any dysuria or diarrhea.  He denies any neck pain or stiffness.  Denies any headaches.  He denies any rash or other complaint.  On arrival, the patient was septic, febrile to one 1.7, tachycardic to 115.  Code sepsis initiated on patient arrival and the patient was started on broad-spectrum antibiotics to include vancomycin, Flagyl, cefepime.  Differential diagnosis includes sepsis from urinary tract infection, intra-abdominal source, pneumonia.  Concern for potential intra-abdominal infection given the patient's history of biliary dysfunction status post cholecystectomy, previous sepsis, right upper quadrant tenderness on exam.  Considered ascending cholangitis, pancreatitis, intra-abdominal abscess.  Patient found to have a lactic acidosis to greater than 9, received 30 cc/kg on IV fluid bolus, repeat lactic acid downtrending to 6 after fluid resuscitation.  A CBC was without a leukocytosis, hemoglobin with anemia to 10.7, CMP without significant Electra abnormality, anion gap acidosis present likely from lactic acidosis with a bicarbonate of 17, anion gap of 21.  Concern for biliary and liver dysfunction with an AST of 187, ALT 103, alkaline phosphatase to 833, T. bili 2.2.  Urinalysis was pending.  COVID-19 PCR testing was collected and negative.  Urine culture pending.  Blood cultures collected and pending.  On repeat assessment, the patient's blood pressure continued to trend down despite fluid resuscitation.  The patient's CTA abdomen pelvis was ordered to evaluate for possible mesenteric ischemia versus intra-abdominal infection given his significant lactic acidosis and concern for sepsis.  CTA imaging was pending at time of  signout.  Plan at time of signout to reassess the patient, continue to trend patient's blood pressure, ultimate disposition pending reassessment.  Signout given to Dr. Langston Masker at [phone].   Final Clinical Impression(s) / ED Diagnoses Final diagnoses:  Sepsis, due to unspecified organism, unspecified whether acute organ dysfunction present [facility])  Ascending cholangitis    Rx / DC Orders ED Discharge Orders     None         Regan Lemming, MD 03/19/22 1857

## 2022-03-19 NOTE — ED Notes (Signed)
Only able to obtain one set of blood cultures in triage

## 2022-03-19 NOTE — ED Notes (Signed)
EDP at BS 

## 2022-03-19 NOTE — ED Notes (Signed)
Pt to CT

## 2022-03-19 NOTE — ED Notes (Signed)
Pt alert, NAD, calm, interactive, shivering/ rigors present, HOH, fever present, family at [facility].

## 2022-03-19 NOTE — Sepsis Progress Note (Signed)
eLink is following this Code Sepsis. °

## 2022-03-19 NOTE — Assessment & Plan Note (Signed)
Denies any chest pain.  Hold aspirin and statin for now.

## 2022-03-19 NOTE — Assessment & Plan Note (Signed)
Holding statin with elevated LFTs.

## 2022-03-19 NOTE — H&P (Signed)
History and Physical    Randy Thomas ZOX:[id number] DOB: 1936/05/14 DOA: 03/19/2022  PCP: Susy Frizzle, MD  Patient coming from: Home  I have personally briefly reviewed patient's old medical records in [place]  Chief Complaint: Fevers, abdominal pain  HPI: Randy Thomas is a 86 y.o. male with medical history significant for CAD, HTN, HLD, gallstone pancreatitis, laparoscopic cholecystectomy complicated by CBD transection requiring emergent duct reconstruction with Roux-en-Y hepaticojejunostomy 05/21/2020 who presented to the ED for evaluation of fevers and abdominal pain.  Patient reports 3 days of fevers, chills, intermittent abdominal pain.  Has had fever up to 102.4 F at home.  He denies any dysuria, constipation, diarrhea.  He has not had any chest pain or dyspnea.  He follows with [facility] surgery, Dr. 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  This has been unamenable to ERCP when last attempted 02/06/2022.  Consideration for endoscopic hepatic gastrostomy or placement of PTC was discussed however not pursued as he was asymptomatic when last seen by his surgeon.  ED Course  Labs/Imaging on admission: I have personally reviewed following labs and imaging studies.  Initial vitals showed BP 131/77, pulse 115, RR 18, temp 90.0 F, SPO2 100% on room air.  Tmax 102.8 F.  While in the ED patient transiently hypotensive with BP 96/48.  Labs significant for AST 187, ALT 103, alk phos 833, total bilirubin 2.2, sodium 144, potassium 4.1, bicarb 17, BUN 19, creatinine 0.99, serum glucose 90, WBC 5.6, hemoglobin 10.7, platelets 159,000, lactic acid >9.0 >> 6.0.  SARS-CoV-2 PCR negative.  2 view chest x-ray negative for focal consolidation, edema, effusion.  CTA abdomen/pelvis negative for evidence of mesenteric ischemia.  Surgical changes of hepaticojejunostomy for treatment of prior bile duct injury  noted.  Similar changes of left greater than right intrahepatic biliary dilatation and pneumobilia with mild increase edema/inflammatory changes at the hilum of the liver seen.  Increase in size of lymph nodes in the deep liver drainage pathway noted.  Asymmetric hyperenhancement at the right base of the prostate also noted.  Patient was given IV vancomycin, cefepime, Flagyl, 2 L LR.    EDP discussed with on-call [facility] Dr. Hilarie Fredrickson who recommended transfer to [facility] and consideration of IR consultation for [facility] pending placement.    EDP spoke with [facility], Dr. Junita Push, and hepatopancreatic surgery service, Dr. Clovis Riley, at [facility] who recommended continuing IV antibiotics, continue with plan for PTC, and accepted for transfer pending bed availability.    EDP discussed with on-call IR, Dr. Maryelizabeth Kaufmann, who recommended keeping patient n.p.o. at midnight for PTC in the morning.  Given no bed availability and plan for IR guided PTC here in the morning the hospitalist service was consulted to admit for further management pending transfer availability.  Review of Systems: All systems reviewed and are negative except as documented in history of present illness above.   Past Medical History:  Diagnosis Date   Allergy    GERD (gastroesophageal reflux disease)    Hard of hearing    Hypercholesteremia    Hyperlipidemia    Hypertension    Myocardial infarction ([place])    X 2 stents    Past Surgical History:  Procedure Laterality Date   CARDIAC CATHETERIZATION     X 2 stents   CHOLECYSTECTOMY N/A 05/20/2020   Procedure: LAPAROSCOPIC CHOLECYSTECTOMY;  Surgeon: [name], MD;  Location: [facility] ORS;  Service: General;  Laterality: N/A;   ERCP N/A 05/18/2020   Procedure: ENDOSCOPIC RETROGRADE CHOLANGIOPANCREATOGRAPHY (ERCP) ;  Surgeon: Rogene Houston, MD;  Location: [facility] ENDO SUITE;  Service: Endoscopy;  Laterality: N/A;   ERCP N/A 05/20/2020   Procedure: ENDOSCOPIC RETROGRADE  CHOLANGIOPANCREATOGRAPHY (ERCP);  Surgeon: [name], MD;  Location: [facility] ORS;  Service: General;  Laterality: N/A;   [place] wore cast   SPHINCTEROTOMY  05/18/2020   Procedure: SPHINCTEROTOMY mild;  Surgeon: Rogene Houston, MD;  Location: [facility] ENDO SUITE;  Service: Endoscopy;;   TONSILLECTOMY     as a child    Social History:  reports that he quit smoking about 40 years ago. His smoking use included cigarettes. He has never used smokeless tobacco. He reports that he does not drink alcohol and does not use drugs.  No Known Allergies  Family History  Problem Relation Age of Onset   Asthma Mother    Hearing loss Mother    Heart disease Father    Diabetes Brother    Diabetes Maternal Grandmother    Colon cancer Neg Hx    Colon polyps Neg Hx      Prior to Admission medications   Medication Sig Start Date End Date Taking? Authorizing Provider  aspirin EC 81 MG tablet Take 1 tablet (81 mg total) by mouth daily. 11/19/16  Yes Dena Billet B, PA-C  atorvastatin (LIPITOR) 40 MG tablet Take 40 mg by mouth at bedtime.   Yes [provider]  cetirizine (ZYRTEC) 10 MG tablet TAKE 1 TABLET(10 MG) BY MOUTH DAILY Patient taking differently: Take 10 mg by mouth daily. 05/23/20  Yes Susy Frizzle, MD  Cholecalciferol (VITAMIN D3 SUPER STRENGTH) 50 MCG (2000 UT) CAPS Take 2,000 Units by mouth daily.   Yes [provider]  ferrous sulfate (FERROUSUL) 325 (65 FE) MG tablet Take 325 mg by mouth daily with breakfast.   Yes [provider]  methocarbamol (ROBAXIN) 750 MG tablet Take 1 tablet (750 mg total) by mouth 4 (four) times daily. Patient taking differently: Take 750 mg by mouth 2 (two) times daily as needed for muscle spasms. 05/03/19  Yes Susy Frizzle, MD  metoprolol tartrate (LOPRESSOR) 50 MG tablet Take 25 mg by mouth daily.   Yes [provider]  Multiple Vitamin (MULTIVITAMIN WITH MINERALS) TABS tablet Take 1 tablet by mouth  daily. Patient taking differently: Take 1 tablet by mouth daily with breakfast. 11/19/16  Yes Dena Billet B, PA-C  nitroGLYCERIN (NITROSTAT) 0.4 MG SL tablet Place 0.4 mg under the tongue every 5 (five) minutes as needed for chest pain. 02/16/20  Yes [provider]  Omega 3 1000 MG CAPS Take 2,000 mg by mouth daily.   Yes [provider]  pantoprazole (PROTONIX) 40 MG tablet Take 40 mg by mouth daily before breakfast.   Yes [provider]  VASCEPA 1 g capsule Take 1 capsule by mouth in the morning and at bedtime. 03/12/20   [provider]  vitamin B-12 (CYANOCOBALAMIN) 500 MCG tablet Take 500 mcg by mouth daily.   Yes [provider]  cholecalciferol (VITAMIN D) 1000 units tablet Take 1 tablet (1,000 Units total) by mouth daily. Patient not taking: Reported on 03/19/2022 11/19/16   Orlena Sheldon, PA-C  HYDROcodone-acetaminophen (NORCO/VICODIN) 5-325 MG tablet Take 1 tablet by mouth every 6 (six) hours as needed. 01/02/22   Susy Frizzle, MD  metoprolol succinate (TOPROL-XL) 50 MG 24 hr tablet TAKE 1/2  TABLET BY MOUTH IN THE MORNING AND 1/2 TABLET BY MOUTH IN THE EVENING Patient not taking: Reported on 03/19/2022 11/19/16   Orlena Sheldon, PA-C    Physical Exam: Vitals:   03/19/22 1918 03/19/22 2030 03/19/22 2100 03/19/22 2130  BP:  (!) 112/57 (!) 104/51 (!) 105/49  Pulse:  82 77 73  Resp:  16  18  Temp: 98 F (36.7 C)     TempSrc: Oral     SpO2:  100% 100% 99%  Weight:       Constitutional: Elderly man resting in bed with head elevated.  NAD, calm, comfortable Eyes: EOMI, lids and conjunctivae normal ENMT: Hard of hearing mucous membranes are moist. Posterior pharynx clear of any exudate or lesions.Normal dentition.  Neck: normal, supple, no masses. Respiratory: clear to auscultation bilaterally, no wheezing, no crackles. Normal respiratory effort. No accessory muscle use.  Cardiovascular: Regular rate and rhythm, no murmurs / rubs / gallops. No  extremity edema. 2+ pedal pulses. Abdomen: no tenderness, no masses palpated.Bowel sounds positive.  Musculoskeletal: no clubbing / cyanosis. No joint deformity upper and lower extremities. Good ROM, no contractures. Normal muscle tone.  Skin: no rashes, lesions, ulcers. No induration Neurologic: Sensation intact. Strength 5/5 in all 4.  Psychiatric: Alert and oriented x 3. Normal mood.   EKG: Not performed.  Assessment/Plan Principal Problem:   Severe sepsis (HCC) Active Problems:   Acute cholangitis   Coronary artery disease   Hyperlipidemia   HTN (hypertension)   Elevated LFTs   Randy Thomas is a 86 y.o. male with medical history significant for CAD, HTN, HLD, gallstone pancreatitis, laparoscopic cholecystectomy complicated by CBD transection requiring emergent duct reconstruction with Roux-en-Y hepaticojejunostomy 05/21/2020 who is admitted with acute cholangitis in setting of suspected biliary anastomosis stricture.  Started on IV antibiotics with plan for IR guided PTC pending bed availability for transfer to [facility].  Assessment and Plan: * Severe sepsis [facility]) Acute cholangitis Hx type B for bile duct injury s/p Roux-en-Y hepaticojejunostomy 05/21/2020 Elevated LFTs Presenting with fevers, tachycardia, hypotension, lactic acidosis.  Clinical history and imaging concerning for cholangitis, suspected due to stricture at biliary anastomosis.  Anatomy not amenable to ERCP.  Accepted for transfer to [facility] for hepatopancreatic surgery service however no beds available.  Recommendation was for IR guided PTC and admission with continued antibiotics pending transfer availability. -Continue IV Zosyn -Follow blood cultures -N.p.o. after midnight for IR PTC in a.m. -Continue IV fluid hydration overnight -Transfer to [facility] pending bed availability  HTN (hypertension) Hold home Lopressor in setting of hypotension.  Hyperlipidemia Holding statin with elevated  LFTs.  Coronary artery disease Denies any chest pain.  Hold aspirin and statin for now.  DVT prophylaxis: SCDs Start: 03/19/22 2243 Code Status: Full code, confirmed on admission Family Communication: Discussed with patient's daughter at bedside Disposition Plan: Pending transfer to [place] called: GI, IR at [facility]. GI, hepatopancreatic surgery at [facility] Severity of Illness: The appropriate patient status for this patient is INPATIENT. Inpatient status is judged to be reasonable and necessary in order to provide the required intensity of service to ensure the patient's safety. The patient's presenting symptoms, physical exam findings, and initial radiographic and laboratory data in the context of their chronic comorbidities is felt to place them at high risk for further clinical deterioration. Furthermore, it is not anticipated that the patient will be medically stable for discharge from the hospital within 2 midnights of admission.   *  I certify that at the point of admission it is my clinical judgment that the patient will require inpatient hospital care spanning beyond 2 midnights from the point of admission due to high intensity of service, high risk for further deterioration and high frequency of surveillance required.Zada Finders MD [facility]  If 7PM-7AM, please contact night-coverage [email or web address]  03/19/2022, 10:52 PM

## 2022-03-19 NOTE — ED Notes (Signed)
Pt can have water and applesauce at this time - pt given at this time

## 2022-03-20 DIAGNOSIS — E872 Acidosis, unspecified: Secondary | ICD-10-CM | POA: Diagnosis not present

## 2022-03-20 DIAGNOSIS — K8309 Other cholangitis: Secondary | ICD-10-CM | POA: Diagnosis not present

## 2022-03-20 DIAGNOSIS — E785 Hyperlipidemia, unspecified: Secondary | ICD-10-CM | POA: Diagnosis not present

## 2022-03-20 DIAGNOSIS — I10 Essential (primary) hypertension: Secondary | ICD-10-CM | POA: Diagnosis not present

## 2022-03-20 DIAGNOSIS — A419 Sepsis, unspecified organism: Secondary | ICD-10-CM | POA: Diagnosis not present

## 2022-03-20 DIAGNOSIS — D72829 Elevated white blood cell count, unspecified: Secondary | ICD-10-CM | POA: Diagnosis not present

## 2022-03-20 DIAGNOSIS — R6520 Severe sepsis without septic shock: Secondary | ICD-10-CM | POA: Diagnosis not present

## 2022-03-20 DIAGNOSIS — R7881 Bacteremia: Secondary | ICD-10-CM | POA: Diagnosis not present

## 2022-03-20 DIAGNOSIS — K9189 Other postprocedural complications and disorders of digestive system: Secondary | ICD-10-CM | POA: Diagnosis not present

## 2022-03-20 DIAGNOSIS — R7989 Other specified abnormal findings of blood chemistry: Secondary | ICD-10-CM

## 2022-03-20 DIAGNOSIS — R188 Other ascites: Secondary | ICD-10-CM | POA: Diagnosis not present

## 2022-03-20 DIAGNOSIS — K831 Obstruction of bile duct: Secondary | ICD-10-CM | POA: Diagnosis not present

## 2022-03-20 DIAGNOSIS — B9629 Other Escherichia coli [E. coli] as the cause of diseases classified elsewhere: Secondary | ICD-10-CM | POA: Diagnosis not present

## 2022-03-20 LAB — BLOOD CULTURE ID PANEL (REFLEXED) - BCID2

## 2022-03-20 LAB — COMPREHENSIVE METABOLIC PANEL
ALT: 74 U/L — ABNORMAL HIGH (ref 0–44)
AST: 134 U/L — ABNORMAL HIGH (ref 15–41)
Albumin: 2 g/dL — ABNORMAL LOW (ref 3.5–5.0)
Alkaline Phosphatase: 518 U/L — ABNORMAL HIGH (ref 38–126)
Anion gap: 9 (ref 5–15)
BUN: 19 mg/dL (ref 8–23)
CO2: 24 mmol/L (ref 22–32)
Calcium: 8.3 mg/dL — ABNORMAL LOW (ref 8.9–10.3)
Chloride: 107 mmol/L (ref 98–111)
Creatinine, Ser: 0.67 mg/dL (ref 0.61–1.24)
GFR, Estimated: >60 mL/min (ref >60)
Glucose, Bld: 185 mg/dL — ABNORMAL HIGH (ref 70–99)
Potassium: 3.4 mmol/L — ABNORMAL LOW (ref 3.5–5.1)
Sodium: 140 mmol/L (ref 135–145)
Total Bilirubin: 1.4 mg/dL — ABNORMAL HIGH (ref 0.3–1.2)
Total Protein: 5.3 g/dL — ABNORMAL LOW (ref 6.5–8.1)

## 2022-03-20 LAB — CBC
HCT: 24.9 % — ABNORMAL LOW (ref 39.0–52.0)
Hemoglobin: 8.1 g/dL — ABNORMAL LOW (ref 13.0–17.0)
MCH: 27.6 pg (ref 26.0–34.0)
MCHC: 32.5 g/dL (ref 30.0–36.0)
MCV: 85 fL (ref 80.0–100.0)
Platelets: 97 10*3/uL — ABNORMAL LOW (ref 150–400)
RBC: 2.93 MIL/uL — ABNORMAL LOW (ref 4.22–5.81)
RDW: 16.2 % — ABNORMAL HIGH (ref 11.5–15.5)
WBC: 13.2 10*3/uL — ABNORMAL HIGH (ref 4.0–10.5)
nRBC: 0 % (ref 0.0–0.2)

## 2022-03-20 LAB — PROTIME-INR
INR: 1.8 — ABNORMAL HIGH (ref 0.8–1.2)
Prothrombin Time: 20.9 seconds — ABNORMAL HIGH (ref 11.4–15.2)

## 2022-03-20 LAB — PROCALCITONIN: Procalcitonin: 25.77 ng/mL

## 2022-03-20 LAB — LACTIC ACID, PLASMA: Lactic Acid, Venous: 3.4 mmol/L (ref 0.5–1.9)

## 2022-03-20 MED ORDER — POTASSIUM CHLORIDE 10 MEQ/100ML IV SOLN
10.0000 meq | INTRAVENOUS | Status: AC
  Administered 2022-03-20 (×2): 10 meq via INTRAVENOUS
  Filled 2022-03-20 (×2): qty 100

## 2022-03-20 MED ORDER — ACETAMINOPHEN 325 MG PO TABS: 650.0000 mg | ORAL_TABLET | Freq: Four times a day (QID) | ORAL | Status: DC | PRN

## 2022-03-20 MED ORDER — LACTATED RINGERS IV SOLN: INTRAVENOUS | 0 refills | Status: AC

## 2022-03-20 MED ORDER — IPRATROPIUM-ALBUTEROL 0.5-2.5 (3) MG/3ML IN SOLN: 3.0000 mL | RESPIRATORY_TRACT | Status: AC | PRN

## 2022-03-20 MED ORDER — LORAZEPAM 2 MG/ML IJ SOLN
0.5000 mg | Freq: Four times a day (QID) | INTRAMUSCULAR | Status: DC | PRN
Start: 2022-03-20 — End: 2022-03-20

## 2022-03-20 MED ORDER — PIPERACILLIN-TAZOBACTAM 3.375 G IVPB: 3.3750 g | Freq: Three times a day (TID) | INTRAVENOUS | Status: AC

## 2022-03-20 MED ORDER — IPRATROPIUM-ALBUTEROL 0.5-2.5 (3) MG/3ML IN SOLN
3.0000 mL | RESPIRATORY_TRACT | Status: DC | PRN
Start: 2022-03-20 — End: 2022-03-20

## 2022-03-20 NOTE — ED Notes (Signed)
Brandy from [facility] transfer team called to get update on pt - states there is still no beds available

## 2022-03-20 NOTE — Progress Notes (Addendum)
Pt is transferred from ED to 4E09. He is alert and fully oriented x 4, able to ambulate independently to bathroom. His daughter is at bedside. Pt is hemodynamically stable, afebrile, no acute distress at arrival.   CHG bath is given, CCMD verification with 2nd person is done. Call bell is within reaching. Pt is oriented to room and all equipments. Initially, Pt and his daughter understand the treatment plan. Per MD's note, Pt is waiting for the [facility] bed available. NPO for PTC at IR this am.  We will continue to monitor.  Kennyth Lose, RN

## 2022-03-20 NOTE — Progress Notes (Signed)
PROGRESS NOTE        PATIENT DETAILS Name: Randy Thomas Age: 86 y.o. Sex: male Date of Birth: 1936-07-07 Admit Date: 03/19/2022 Admitting Physician Lenore Cordia, MD RTM:[id number], Cammie Mcgee, MD  Brief Summary: Patient is a 86 y.o.  male with prior history of gallstone pancreatitis requiring laparoscopic cholecystectomy-complicated by CBD transection requiring emergent duct reconstruction with Roux-en-Y hepatogastrojejunostomy 2021 who presented with fever/abdominal pain-found to have acute cholangitis and gram-negative bacteremia.   Significant events: 7/5>> admit for cholangitis/gram-negative bacteremia  Significant studies: 7/5>> CXR: No PNA 7/5>> CT angio abdomen/pelvis: Gas within biliary ducts-intrahepatic ductal dilatation  Significant microbiology data: 7/5>> COVID PCR: Negative 7/5>> blood culture: Gram-negative rod  Procedures: None  Consults: IR  Subjective: Lying comfortably in bed-denies any chest pain or shortness of breath.  Feels better-abdominal pain has essentially resolved.  Objective: Vitals: Blood pressure (!) 101/50, pulse 66, temperature 97.8 F (36.6 C), temperature source Oral, resp. rate 18, height _0  (1.702 m), weight 62.3 kg, SpO2 98 %.   Exam: Gen Exam:Alert awake-not in any distress HEENT:atraumatic, normocephalic Chest: B/L clear to auscultation anteriorly CVS:S1S2 regular Abdomen:soft non tender, non distended Extremities:no edema Neurology: Non focal Skin: no rash  Pertinent Labs/Radiology:    Latest Ref Rng & Units 03/20/2022    3:47 AM 03/19/2022    1:00 PM 11/25/2021    2:36 PM  CBC  WBC 4.0 - 10.5 K/uL 13.2  5.6  5.6   Hemoglobin 13.0 - 17.0 g/dL 8.1  10.7  10.9   Hematocrit 39.0 - 52.0 % 24.9  35.7  34.2   Platelets 150 - 400 K/uL 97  159  169     Lab Results  Component Value Date   NA 140 03/20/2022   K 3.4 (L) 03/20/2022   CL 107 03/20/2022   CO2 24 03/20/2022       Assessment/Plan: Severe sepsis due to acute cholangitis and gram-negative bacteremia: Sepsis physiology improving-continue Zosyn-IR consulted for percutaneous transhepatic cholangiogram (per prior notes-his anatomy is not amenable for ERCP)   History of type B bile duct injury-s/p Roux-en-Y hepaticojejunostomy 05/21/2020: Follows with [facility]-Dr. 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  Awaiting IR consult for PTC-awaiting transfer to [facility] (on a waiting list-no beds available per my conversation with the transfer center at this morning)  Thrombocytopenia: Mild-likely due to sepsis/bacteremia-follow.  Normocytic anemia: No evidence of blood loss-likely due to acute illness-appears to have some amount of chronic anemia at baseline.  Follow hemoglobin and transfuse if significant drop.  HTN: Hypotensive on presentation-BP now stabilizing-remains off all antihypertensives.  Resume when able.  HLD: Statins on hold due to transaminitis.  CAD: No obvious anginal symptoms-n.p.o. for cholangitis/PTC procedure-resume aspirin/statin when oral intake resumes.  BMI: Estimated body mass index is 21.51 kg/m as calculated from the following:   Height as of this encounter: _1  (1.702 m).   Weight as of this encounter: 62.3 kg.   Code status:   Code Status: Full Code   DVT Prophylaxis: SCDs Start: 03/19/22 2243   Family Communication: Spouse at bedside   Disposition Plan: Status is: Inpatient Remains inpatient appropriate because: Sepsis-cholangitis-gram-negative bacteremia-on IV antibiotics-IR planning PTC-not stable for discharge.  Planned Discharge Destination: [place] pending.   Diet: Diet Order  Diet NPO time specified  Diet effective midnight                     Antimicrobial agents: Anti-infectives (From admission, onward)    Start      Dose/Rate Route Frequency Ordered Stop   03/20/22 1500  vancomycin (VANCOCIN) IVPB 1000 mg/200 mL premix  Status:  Discontinued        1,000 mg 200 mL/hr over 60 Minutes Intravenous Every 24 hours 03/19/22 1651 03/19/22 2244   03/20/22 0200  ceFEPIme (MAXIPIME) 2 g in sodium chloride 0.9 % 100 mL IVPB  Status:  Discontinued        2 g 200 mL/hr over 30 Minutes Intravenous Every 12 hours 03/19/22 1651 03/19/22 2244   03/20/22 0200  piperacillin-tazobactam (ZOSYN) IVPB 3.375 g        3.375 g 12.5 mL/hr over 240 Minutes Intravenous Every 8 hours 03/19/22 2255     03/19/22 1330  ceFEPIme (MAXIPIME) 2 g in sodium chloride 0.9 % 100 mL IVPB        2 g 200 mL/hr over 30 Minutes Intravenous  Once 03/19/22 1325 03/19/22 1426   03/19/22 1330  metroNIDAZOLE (FLAGYL) IVPB 500 mg        500 mg 100 mL/hr over 60 Minutes Intravenous  Once 03/19/22 1325 03/19/22 1444   03/19/22 1330  vancomycin (VANCOCIN) IVPB 1000 mg/200 mL premix  Status:  Discontinued        1,000 mg 200 mL/hr over 60 Minutes Intravenous  Once 03/19/22 1325 03/19/22 1329   03/19/22 1330  vancomycin (VANCOREADY) IVPB 1250 mg/250 mL        1,250 mg 166.7 mL/hr over 90 Minutes Intravenous  Once 03/19/22 1329 03/19/22 1656        MEDICATIONS: Scheduled Meds:  lidocaine  1 Application Topical Once   sodium chloride flush  3 mL Intravenous Q12H   Continuous Infusions:  piperacillin-tazobactam (ZOSYN)  IV Stopped (03/20/22 0630)   PRN Meds:.LORazepam, ondansetron **OR** ondansetron (ZOFRAN) IV   I have personally reviewed following labs and imaging studies  LABORATORY DATA: CBC: Recent Labs  Lab 03/19/22 1300 03/20/22 0347  WBC 5.6 13.2*  NEUTROABS 4.6  --   HGB 10.7* 8.1*  HCT 35.7* 24.9*  MCV 88.6 85.0  PLT 159 97*    Basic Metabolic Panel: Recent Labs  Lab 03/19/22 1300 03/20/22 0347  NA 144 140  K 4.1 3.4*  CL 106 107  CO2 17* 24  GLUCOSE 90 185*  BUN 19 19  CREATININE 0.99 0.67  CALCIUM 9.2 8.3*     GFR: Estimated Creatinine Clearance: 61.7 mL/min (by C-G formula based on SCr of 0.67 mg/dL).  Liver Function Tests: Recent Labs  Lab 03/19/22 1300 03/20/22 0347  AST 187* 134*  ALT 103* 74*  ALKPHOS 833* 518*  BILITOT 2.2* 1.4*  PROT 7.3 5.3*  ALBUMIN 2.9* 2.0*   No results for input(s): "LIPASE", "AMYLASE" in the last 168 hours. No results for input(s): "AMMONIA" in the last 168 hours.  Coagulation Profile: Recent Labs  Lab 03/19/22 1300 03/20/22 0347  INR 1.4* 1.8*    Cardiac Enzymes: No results for input(s): "CKTOTAL", "CKMB", "CKMBINDEX", "TROPONINI" in the last 168 hours.  BNP (last 3 results) No results for input(s): "PROBNP" in the last 8760 hours.  Lipid Profile: No results for input(s): "CHOL", "HDL", "LDLCALC", "TRIG", "CHOLHDL", "LDLDIRECT" in the last 72 hours.  Thyroid Function Tests: No results for input(s): "TSH", "T4TOTAL", "FREET4", "T3FREE", "THYROIDAB" in  the last 72 hours.  Anemia Panel: No results for input(s): "VITAMINB12", "FOLATE", "FERRITIN", "TIBC", "IRON", "RETICCTPCT" in the last 72 hours.  Urine analysis:    Component Value Date/Time   COLORURINE AMBER (A) 03/19/2022 1835   APPEARANCEUR CLEAR 03/19/2022 1835   LABSPEC >1.046 (H) 03/19/2022 1835   PHURINE 5.0 03/19/2022 [place] 03/19/2022 [place] 03/19/2022 [place] 03/19/2022 [place] 03/19/2022 1835   PROTEINUR 30 (A) 03/19/2022 1835   UROBILINOGEN 0.2 09/27/2012 1445   NITRITE NEGATIVE 03/19/2022 1835   LEUKOCYTESUR NEGATIVE 03/19/2022 1835    Sepsis Labs: Lactic Acid, Venous    Component Value Date/Time   LATICACIDVEN 3.4 ([place]) 03/20/2022 0033    MICROBIOLOGY: Recent Results (from the past 240 hour(s))  Culture, blood (Routine x 2)     Status: None (Preliminary result)   Collection Time: 03/19/22 12:06 PM   Specimen: BLOOD  Result Value Ref Range Status   Specimen Description BLOOD BLOOD RIGHT  FOREARM  Final   Special Requests   Final    BOTTLES DRAWN AEROBIC AND ANAEROBIC Blood Culture adequate volume   Culture  Setup Time   Final    GRAM NEGATIVE RODS IN BOTH AEROBIC AND ANAEROBIC BOTTLES CRITICAL VALUE NOTED.  VALUE IS CONSISTENT WITH PREVIOUSLY REPORTED AND CALLED VALUE. Performed at [facility], [place] [address] [place], [place] [address]    Culture PENDING  Incomplete   Report Status PENDING  Incomplete  Culture, blood (Routine x 2)     Status: None (Preliminary result)   Collection Time: 03/19/22 12:18 PM   Specimen: BLOOD LEFT FOREARM  Result Value Ref Range Status   Specimen Description BLOOD LEFT FOREARM  Final   Special Requests   Final    BOTTLES DRAWN AEROBIC AND ANAEROBIC Blood Culture results may not be optimal due to an inadequate volume of blood received in culture bottles   Culture  Setup Time   Final    GRAM NEGATIVE RODS IN BOTH AEROBIC AND ANAEROBIC BOTTLES CRITICAL RESULT CALLED TO, READ BACK BY AND VERIFIED WITH: T RUDISILL,[email or web address]  03/20/22 [place] Performed at [facility], [place] [address] [place], [place] [address]    Culture PENDING  Incomplete   Report Status PENDING  Incomplete  Blood Culture ID Panel (Reflexed)     Status: Abnormal   Collection Time: 03/19/22 12:18 PM  Result Value Ref Range Status   Enterococcus faecalis NOT DETECTED NOT DETECTED Final   Enterococcus Faecium NOT DETECTED NOT DETECTED Final   Listeria monocytogenes NOT DETECTED NOT DETECTED Final   Staphylococcus species NOT DETECTED NOT DETECTED Final   Staphylococcus aureus (BCID) NOT DETECTED NOT DETECTED Final   Staphylococcus epidermidis NOT DETECTED NOT DETECTED Final   Staphylococcus lugdunensis NOT DETECTED NOT DETECTED Final   Streptococcus species NOT DETECTED NOT DETECTED Final   Streptococcus agalactiae NOT DETECTED NOT DETECTED Final   Streptococcus pneumoniae NOT DETECTED NOT DETECTED Final   Streptococcus pyogenes NOT DETECTED NOT DETECTED Final    A.calcoaceticus-baumannii NOT DETECTED NOT DETECTED Final   Bacteroides fragilis NOT DETECTED NOT DETECTED Final   Enterobacterales DETECTED (A) NOT DETECTED Final    Comment: Enterobacterales represent a large order of gram negative bacteria, not a single organism. CRITICAL RESULT CALLED TO, READ BACK BY AND VERIFIED WITH: T RUDISILL,[email or web address]  03/20/22 [place]    Enterobacter cloacae complex NOT DETECTED NOT DETECTED Final   Escherichia coli NOT DETECTED NOT DETECTED Final  Klebsiella aerogenes NOT DETECTED NOT DETECTED Final   Klebsiella oxytoca NOT DETECTED NOT DETECTED Final   Klebsiella pneumoniae DETECTED (A) NOT DETECTED Final    Comment: CRITICAL RESULT CALLED TO, READ BACK BY AND VERIFIED WITH: T RUDISILL,[email or web address]_[email or web address]  03/20/22 [place]    Proteus species NOT DETECTED NOT DETECTED Final   Salmonella species NOT DETECTED NOT DETECTED Final   Serratia marcescens NOT DETECTED NOT DETECTED Final   Haemophilus influenzae NOT DETECTED NOT DETECTED Final   Neisseria meningitidis NOT DETECTED NOT DETECTED Final   Pseudomonas aeruginosa NOT DETECTED NOT DETECTED Final   Stenotrophomonas maltophilia NOT DETECTED NOT DETECTED Final   Candida albicans NOT DETECTED NOT DETECTED Final   Candida auris NOT DETECTED NOT DETECTED Final   Candida glabrata NOT DETECTED NOT DETECTED Final   Candida krusei NOT DETECTED NOT DETECTED Final   Candida parapsilosis NOT DETECTED NOT DETECTED Final   Candida tropicalis NOT DETECTED NOT DETECTED Final   Cryptococcus neoformans/gattii NOT DETECTED NOT DETECTED Final   CTX-M ESBL NOT DETECTED NOT DETECTED Final   Carbapenem resistance IMP NOT DETECTED NOT DETECTED Final   Carbapenem resistance KPC NOT DETECTED NOT DETECTED Final   Carbapenem resistance NDM NOT DETECTED NOT DETECTED Final   Carbapenem resist OXA 48 LIKE NOT DETECTED NOT DETECTED Final   Carbapenem resistance VIM NOT DETECTED NOT DETECTED Final    Comment: Performed at [facility],  [address] [place], [place] [address]  SARS Coronavirus 2 by RT PCR (hospital order, performed in [facility] hospital lab) *cepheid single result test* Anterior Nasal Swab     Status: None   Collection Time: 03/19/22  2:52 PM   Specimen: Anterior Nasal Swab  Result Value Ref Range Status   SARS Coronavirus 2 by RT PCR NEGATIVE NEGATIVE Final    Comment: (NOTE) SARS-CoV-2 target nucleic acids are NOT DETECTED.  The SARS-CoV-2 RNA is generally detectable in upper and lower respiratory specimens during the acute phase of infection. The lowest concentration of SARS-CoV-2 viral copies this assay can detect is 250 copies / mL. A negative result does not preclude SARS-CoV-2 infection and should not be used as the sole basis for treatment or other patient management decisions.  A negative result may occur with improper specimen collection / handling, submission of specimen other than nasopharyngeal swab, presence of viral mutation(s) within the areas targeted by this assay, and inadequate number of viral copies (<250 copies / mL). A negative result must be combined with clinical observations, patient history, and epidemiological information.  Fact Sheet for Patients:   https://www.patel.info/  Fact Sheet for Healthcare Providers: https://hall.com/  This test is not yet approved or  cleared by the [place] FDA and has been authorized for detection and/or diagnosis of SARS-CoV-2 by FDA under an Emergency Use Authorization (EUA).  This EUA will remain in effect (meaning this test can be used) for the duration of the COVID-19 declaration under Section 564(b)(1) of the Act, 21 U.S.C. section 360bbb-3(b)(1), unless the authorization is terminated or revoked sooner.  Performed at [facility], [place] [address], [place], [place] [address]     RADIOLOGY STUDIES/RESULTS: CT Angio Abd/Pel W and/or Wo Contrast  Result Date: 03/19/2022 CLINICAL  DATA:  86 year old male with a history of prior bile duct injury during cholecystectomy requiring reconstruction with hepaticojejunostomy (2 right ducts, 1 left duct) performed at [facility] 05/21/2020 Patient now presents with possible infection, abdominal pain intermittent fevers, and concern for mesenteric ischemia EXAM: CTA ABDOMEN AND  PELVIS WITHOUT AND WITH CONTRAST TECHNIQUE: Multidetector CT imaging of the abdomen and pelvis was performed using the standard protocol during bolus administration of intravenous contrast. Multiplanar reconstructed images and MIPs were obtained and reviewed to evaluate the vascular anatomy. RADIATION DOSE REDUCTION: This exam was performed according to the departmental dose-optimization program which includes automated exposure control, adjustment of the mA and/or kV according to patient size and/or use of iterative reconstruction technique. CONTRAST:  73m OMNIPAQUE IOHEXOL 350 MG/ML SOLN COMPARISON:  11/07/2020, 05/18/2020, 08/22/2019 FINDINGS: VASCULAR Aorta: Unremarkable course, caliber, contour of the abdominal aorta. No dissection, aneurysm, or periaortic fluid. Mild to moderate atherosclerotic changes. No pedunculated plaque or ulcerated plaque. Celiac: Celiac artery patent with calcified plaque at the origin. On axial images degree of narrowing is estimated 50%. Branches remain patent with traditional contribution to the common hepatic artery, splenic artery, left gastric artery. SMA: SMA is patent with atherosclerotic changes at the origin contributing to estimated 50% narrowing on axial images. Branches are patent. Renals: - Right: Atherosclerotic changes at the origin of the right renal artery with 50% or less narrowing. - Left: Atherosclerotic changes at the origin of the more cranial left renal artery. Estimated greater than 50% narrowing. The more caudal left renal artery demonstrates no evidence of high-grade stenosis. IMA: Inferior mesenteric artery is patent.  Right lower extremity: Unremarkable course, caliber, and contour of the right iliac system. No aneurysm, dissection, or occlusion. Mild atherosclerotic changes. Hypogastric artery is patent. Common femoral artery patent. Proximal SFA and profunda femoris patent. Left lower extremity: Unremarkable course, caliber, and contour of the left iliac system. No aneurysm, dissection, or occlusion. Mild atherosclerotic changes. Hypogastric artery is patent. Common femoral artery patent. Proximal SFA and profunda femoris patent. Veins: Unremarkable appearance of the venous system. Review of the MIP images confirms the above findings. NON-VASCULAR Lower chest: Atelectatic changes at the lung bases. Bronchiectasis in the lower lobes bilaterally. Hepatobiliary: Surgical changes of hepaticojejunostomy for reported bile duct injury and associated cholecystectomy. Mild gas within the biliary ducts of both left and right-sided ducts. Intrahepatic biliary ductal dilatation of the left-sided ducts, though not significantly increased from the comparison CT of 11/07/2021. To a lesser degree, and similar to prior, the right-sided ducts are also dilated. Pancreas: Unremarkable pancreas. Spleen: Greatest span of the spleen measures more than 13 cm. Adrenals/Urinary Tract: - Right adrenal gland: Unremarkable - Left adrenal gland: Unremarkable. - Right kidney: No hydronephrosis, nephrolithiasis, inflammation, or ureteral dilation. No focal lesion. - Left Kidney: No hydronephrosis, nephrolithiasis, inflammation, or ureteral dilation. No focal lesion. - Urinary Bladder: Unremarkable. Stomach/Bowel: - Stomach: Unremarkable. - Small bowel: Surgical changes of hepaticojejunostomy. No transition point. No focal wall thickening. No evidence of devascularization/ischemia. Mild fat edema within the right upper quadrant adjacent to the surgical site which was present on the comparison. - Appendix: Normal. - Colon: Left-sided colonic diverticular  disease. No focal wall thickening or inflammatory changes. No evidence of bowel obstruction Lymphatic: Redemonstration of supra meso colic lymph nodes within the gastrohepatic ligament, hilum of the liver, portacaval nodal station. Enlarging lymph node in the pericardial station/epiphrenic nodal station. This node now measures 17 mm, with the estimated size on the CT 12/01/2019 of approximately 9 mm. Similar size and configuration of multiple mesenteric lymph nodes throughout small bowel mesentery as well as retroperitoneal/para-aortic lymph nodes. Mesenteric: Trace low-density free fluid within the bilateral pericolic gutter, adjacent to the liver and spleen, and layering dependently in the pelvis. Reproductive: Asymmetric hyperenhancement at the right base of the  prostate. Right-sided hydrocele. Other: No hernia. Musculoskeletal: Multilevel degenerative changes of the spine. Intraosseous hemangioma of T12 is been present over time. No aggressive sclerotic or lucent lesions identified. No acute displaced fracture. IMPRESSION: CT angiogram does not support mesenteric ischemia as a cause of acute symptoms, as there is patency of all 3 mesenteric arteries with only estimated 50% narrowing of both celiac artery and SMA. Redemonstration of surgical changes of hepaticojejunostomy for treatment of prior bile duct injury. There are relatively similar changes of left greater than right intrahepatic biliary dilatation and pneumobilia, with mild increased edema/inflammatory changes at the hilum of the liver. Ascending cholangitis not excluded and correlation with lab values may be useful. Over time there is increasing size of lymph nodes in the deep liver drainage pathway (epiphrenic/pericardial, portal caval, liver hilum, gastrohepatic ligament). Correlation with pancreaticobiliary tumor markers may be useful if there is concern for occult malignancy. Otherwise these may be reactive in the setting of chronic inflammation.  Asymmetric hyperenhancement at the right base of the prostate. Prostate cancer not excluded, and correlation with physical exam and PSA recommended. Trace free fluid throughout the abdomen, presumably reactive given the patient's clinical history. Aortic Atherosclerosis (ICD10-I70.0). Right-sided hydrocele. Signed, Dulcy Fanny. Nadene Rubins, RPVI [facility] Electronically Signed   By: Corrie Mckusick D.O.   On: 03/19/2022 17:17   DG Chest 2 View  Result Date: 03/19/2022 CLINICAL DATA:  Suspected sepsis EXAM: CHEST - 2 VIEW COMPARISON:  11/05/2021 FINDINGS: The heart size and mediastinal contours are within normal limits. Both lungs are clear. The visualized skeletal structures are unremarkable. IMPRESSION: No acute abnormality of the lungs. Electronically Signed   By: Delanna Ahmadi M.D.   On: 03/19/2022 12:31     LOS: 1 day   Oren Binet, MD  [facility]    To contact the attending provider between 7A-7P or the covering provider during after hours 7P-7A, please log into the web site [email or web address] and access using universal [place] password for that web site. If you do not have the password, please call the hospital operator.  03/20/2022, 10:38 AM

## 2022-03-20 NOTE — Discharge Summary (Signed)
PATIENT DETAILS Name: Randy Thomas Age: 86 y.o. Sex: male Date of Birth: 1936/05/06 MRN: [id number]. Admitting Physician: Lenore Cordia, MD [id number], Cammie Mcgee, MD  Admit Date: 03/19/2022 Discharge date: 03/20/2022  Recommendations for Outpatient Follow-up:  Follow up with PCP in 1-2 weeks   Admitted From:  Home  Disposition: [facility]   Discharge Condition: fair  CODE STATUS:   Code Status: Full Code   Diet recommendation:  Diet Order             Diet clear liquid Room service appropriate? Yes; Fluid consistency: Thin  Diet effective now           Diet clear liquid                    Brief Summary: Patient is a 86 y.o.  male with prior history of gallstone pancreatitis requiring laparoscopic cholecystectomy-complicated by CBD transection requiring emergent duct reconstruction with Roux-en-Y hepatogastrojejunostomy 2021 who presented with fever/abdominal pain-found to have acute cholangitis and gram-negative bacteremia.     Significant events: 7/5>> admit for cholangitis/gram-negative bacteremia   Significant studies: 7/5>> CXR: No PNA 7/5>> CT angio abdomen/pelvis: Gas within biliary ducts-intrahepatic ductal dilatation   Significant microbiology data: 7/5>> COVID PCR: Negative 7/5>> blood culture: Gram-negative rod   Procedures: None   Consults: IR  Brief Hospital Course: Severe sepsis due to acute cholangitis and gram-negative bacteremia: Sepsis physiology improving-BP stable-continue Zosyn-IR consulted for percutaneous transhepatic cholangiogram (per prior notes-his anatomy is not amenable for ERCP)-however patient has a bed at [facility] and other interventions can now be performed.  He is clinically improved and stable for transfer to   History of type B bile duct injury-s/p Roux-en-Y hepaticojejunostomy 05/21/2020: Follows with [facility]-Dr. 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   Thrombocytopenia: Mild-likely due to sepsis/bacteremia-follow.  Normocytic anemia: No evidence of blood loss-likely due to acute illness-appears to have some amount of chronic anemia at baseline.  Follow hemoglobin and transfuse if significant drop.   HTN: Hypotensive on presentation-BP now stabilizing-remains off all antihypertensives.  Resume when able.   HLD: Statins on hold due to transaminitis.   CAD: No obvious anginal symptoms-n.p.o. for cholangitis/PTC procedure-resume aspirin/statin when oral intake resumes.   BMI: Estimated body mass index is 21.51 kg/m as calculated from the following:   Height as of this encounter: 5' 7"  (1.702 m).   Weight as of this encounter: 62.3 kg.     Discharge Diagnoses:  Principal Problem:   Severe sepsis ([place]) Active Problems:   Acute cholangitis   Coronary artery disease   Hyperlipidemia   HTN (hypertension)   Elevated LFTs   Discharge Instructions:  Activity:  As tolerated   Discharge Instructions     Call MD for:  persistant nausea and vomiting   Complete by: As directed    Call MD for:  redness, tenderness, or signs of infection (pain, swelling, redness, odor or green/yellow discharge around incision site)   Complete by: As directed    Call MD for:  severe uncontrolled pain   Complete by: As directed    Call MD for:  temperature >100.4   Complete by: As directed    Diet clear liquid   Complete by: As directed    Increase activity slowly   Complete by: As directed       Allergies as of 03/20/2022   No Known Allergies  Medication List     STOP taking these medications    aspirin EC 81 MG tablet   atorvastatin 40 MG tablet Commonly known as: LIPITOR   cetirizine 10 MG tablet Commonly known as: ZYRTEC   cholecalciferol 1000 units tablet Commonly known as: VITAMIN D   FerrouSul 325 (65 FE) MG tablet Generic drug: ferrous sulfate    HYDROcodone-acetaminophen 5-325 MG tablet Commonly known as: NORCO/VICODIN   methocarbamol 750 MG tablet Commonly known as: ROBAXIN   metoprolol succinate 50 MG 24 hr tablet Commonly known as: TOPROL-XL   metoprolol tartrate 50 MG tablet Commonly known as: LOPRESSOR   multivitamin with minerals Tabs tablet   nitroGLYCERIN 0.4 MG SL tablet Commonly known as: NITROSTAT   Omega 3 1000 MG Caps   pantoprazole 40 MG tablet Commonly known as: PROTONIX   Vascepa 1 g capsule Generic drug: icosapent Ethyl   vitamin B-12 500 MCG tablet Commonly known as: CYANOCOBALAMIN   Vitamin D3 Super Strength 50 MCG (2000 UT) Caps Generic drug: Cholecalciferol       TAKE these medications    ipratropium-albuterol 0.5-2.5 (3) MG/3ML Soln Commonly known as: DUONEB Take 3 mLs by nebulization every 4 (four) hours as needed.   lactated ringers infusion 100 cc/hr   piperacillin-tazobactam 3.375 GM/50ML IVPB Commonly known as: ZOSYN Inject 50 mLs (3.375 g total) into the vein every 8 (eight) hours.        Follow-up Information     Susy Frizzle, MD. Schedule an appointment as soon as possible for a visit in 1 week(s).   Specialty: Family Medicine Contact information: [address] [place] [address] [phone]                No Known Allergies   Other Procedures/Studies: CT Angio Abd/Pel W and/or Wo Contrast  Result Date: 03/19/2022 CLINICAL DATA:  86 year old male with a history of prior bile duct injury during cholecystectomy requiring reconstruction with hepaticojejunostomy (2 right ducts, 1 left duct) performed at [facility] 05/21/2020 Patient now presents with possible infection, abdominal pain intermittent fevers, and concern for mesenteric ischemia EXAM: CTA ABDOMEN AND PELVIS WITHOUT AND WITH CONTRAST TECHNIQUE: Multidetector CT imaging of the abdomen and pelvis was performed using the standard protocol during bolus administration of intravenous  contrast. Multiplanar reconstructed images and MIPs were obtained and reviewed to evaluate the vascular anatomy. RADIATION DOSE REDUCTION: This exam was performed according to the departmental dose-optimization program which includes automated exposure control, adjustment of the mA and/or kV according to patient size and/or use of iterative reconstruction technique. CONTRAST:  32m OMNIPAQUE IOHEXOL 350 MG/ML SOLN COMPARISON:  11/07/2020, 05/18/2020, 08/22/2019 FINDINGS: VASCULAR Aorta: Unremarkable course, caliber, contour of the abdominal aorta. No dissection, aneurysm, or periaortic fluid. Mild to moderate atherosclerotic changes. No pedunculated plaque or ulcerated plaque. Celiac: Celiac artery patent with calcified plaque at the origin. On axial images degree of narrowing is estimated 50%. Branches remain patent with traditional contribution to the common hepatic artery, splenic artery, left gastric artery. SMA: SMA is patent with atherosclerotic changes at the origin contributing to estimated 50% narrowing on axial images. Branches are patent. Renals: - Right: Atherosclerotic changes at the origin of the right renal artery with 50% or less narrowing. - Left: Atherosclerotic changes at the origin of the more cranial left renal artery. Estimated greater than 50% narrowing. The more caudal left renal artery demonstrates no evidence of high-grade stenosis. IMA: Inferior mesenteric artery is patent. Right lower extremity: Unremarkable course, caliber,  and contour of the right iliac system. No aneurysm, dissection, or occlusion. Mild atherosclerotic changes. Hypogastric artery is patent. Common femoral artery patent. Proximal SFA and profunda femoris patent. Left lower extremity: Unremarkable course, caliber, and contour of the left iliac system. No aneurysm, dissection, or occlusion. Mild atherosclerotic changes. Hypogastric artery is patent. Common femoral artery patent. Proximal SFA and profunda femoris patent.  Veins: Unremarkable appearance of the venous system. Review of the MIP images confirms the above findings. NON-VASCULAR Lower chest: Atelectatic changes at the lung bases. Bronchiectasis in the lower lobes bilaterally. Hepatobiliary: Surgical changes of hepaticojejunostomy for reported bile duct injury and associated cholecystectomy. Mild gas within the biliary ducts of both left and right-sided ducts. Intrahepatic biliary ductal dilatation of the left-sided ducts, though not significantly increased from the comparison CT of 11/07/2021. To a lesser degree, and similar to prior, the right-sided ducts are also dilated. Pancreas: Unremarkable pancreas. Spleen: Greatest span of the spleen measures more than 13 cm. Adrenals/Urinary Tract: - Right adrenal gland: Unremarkable - Left adrenal gland: Unremarkable. - Right kidney: No hydronephrosis, nephrolithiasis, inflammation, or ureteral dilation. No focal lesion. - Left Kidney: No hydronephrosis, nephrolithiasis, inflammation, or ureteral dilation. No focal lesion. - Urinary Bladder: Unremarkable. Stomach/Bowel: - Stomach: Unremarkable. - Small bowel: Surgical changes of hepaticojejunostomy. No transition point. No focal wall thickening. No evidence of devascularization/ischemia. Mild fat edema within the right upper quadrant adjacent to the surgical site which was present on the comparison. - Appendix: Normal. - Colon: Left-sided colonic diverticular disease. No focal wall thickening or inflammatory changes. No evidence of bowel obstruction Lymphatic: Redemonstration of supra meso colic lymph nodes within the gastrohepatic ligament, hilum of the liver, portacaval nodal station. Enlarging lymph node in the pericardial station/epiphrenic nodal station. This node now measures 17 mm, with the estimated size on the CT 12/01/2019 of approximately 9 mm. Similar size and configuration of multiple mesenteric lymph nodes throughout small bowel mesentery as well as  retroperitoneal/para-aortic lymph nodes. Mesenteric: Trace low-density free fluid within the bilateral pericolic gutter, adjacent to the liver and spleen, and layering dependently in the pelvis. Reproductive: Asymmetric hyperenhancement at the right base of the prostate. Right-sided hydrocele. Other: No hernia. Musculoskeletal: Multilevel degenerative changes of the spine. Intraosseous hemangioma of T12 is been present over time. No aggressive sclerotic or lucent lesions identified. No acute displaced fracture. IMPRESSION: CT angiogram does not support mesenteric ischemia as a cause of acute symptoms, as there is patency of all 3 mesenteric arteries with only estimated 50% narrowing of both celiac artery and SMA. Redemonstration of surgical changes of hepaticojejunostomy for treatment of prior bile duct injury. There are relatively similar changes of left greater than right intrahepatic biliary dilatation and pneumobilia, with mild increased edema/inflammatory changes at the hilum of the liver. Ascending cholangitis not excluded and correlation with lab values may be useful. Over time there is increasing size of lymph nodes in the deep liver drainage pathway (epiphrenic/pericardial, portal caval, liver hilum, gastrohepatic ligament). Correlation with pancreaticobiliary tumor markers may be useful if there is concern for occult malignancy. Otherwise these may be reactive in the setting of chronic inflammation. Asymmetric hyperenhancement at the right base of the prostate. Prostate cancer not excluded, and correlation with physical exam and PSA recommended. Trace free fluid throughout the abdomen, presumably reactive given the patient's clinical history. Aortic Atherosclerosis (ICD10-I70.0). Right-sided hydrocele. Signed, Dulcy Fanny. Nadene Rubins, RPVI [facility] Radiology Electronically Signed   By: Corrie Mckusick D.O.   On: 03/19/2022  17:17   DG Chest 2  View  Result Date: 03/19/2022 CLINICAL DATA:  Suspected sepsis EXAM: CHEST - 2 VIEW COMPARISON:  11/05/2021 FINDINGS: The heart size and mediastinal contours are within normal limits. Both lungs are clear. The visualized skeletal structures are unremarkable. IMPRESSION: No acute abnormality of the lungs. Electronically Signed   By: Delanna Ahmadi M.D.   On: 03/19/2022 12:31     TODAY-DAY OF DISCHARGE:  Subjective:   Randy Thomas today has no headache,no chest abdominal pain,no new weakness tingling or numbness, feels much better wants to go home today.   Objective:   Blood pressure (!) 101/50, pulse 66, temperature 97.8 F (36.6 C), temperature source Oral, resp. rate 18, height 5' 7"  (1.702 m), weight 62.3 kg, SpO2 98 %.  Intake/Output Summary (Last 24 hours) at 03/20/2022 1147 Last data filed at 03/20/2022 0647 Gross per 24 hour  Intake 3988.97 ml  Output --  Net 3988.97 ml   Filed Weights   03/19/22 1400  Weight: 62.3 kg    Exam: Awake Alert, Oriented *3, No new F.N deficits, Normal affect [place].AT,PERRAL Supple Neck,No JVD, No cervical lymphadenopathy appriciated.  Symmetrical Chest wall movement, Good air movement bilaterally, CTAB RRR,No Gallops,Rubs or new Murmurs, No Parasternal Heave +ve B.Sounds, Abd Soft, Non tender, No organomegaly appriciated, No rebound -guarding or rigidity. No Cyanosis, Clubbing or edema, No new Rash or bruise   PERTINENT RADIOLOGIC STUDIES: CT Angio Abd/Pel W and/or Wo Contrast  Result Date: 03/19/2022 CLINICAL DATA:  86 year old male with a history of prior bile duct injury during cholecystectomy requiring reconstruction with hepaticojejunostomy (2 right ducts, 1 left duct) performed at [facility] 05/21/2020 Patient now presents with possible infection, abdominal pain intermittent fevers, and concern for mesenteric ischemia EXAM: CTA ABDOMEN AND PELVIS WITHOUT AND WITH CONTRAST TECHNIQUE: Multidetector CT imaging of the abdomen and pelvis was  performed using the standard protocol during bolus administration of intravenous contrast. Multiplanar reconstructed images and MIPs were obtained and reviewed to evaluate the vascular anatomy. RADIATION DOSE REDUCTION: This exam was performed according to the departmental dose-optimization program which includes automated exposure control, adjustment of the mA and/or kV according to patient size and/or use of iterative reconstruction technique. CONTRAST:  53m OMNIPAQUE IOHEXOL 350 MG/ML SOLN COMPARISON:  11/07/2020, 05/18/2020, 08/22/2019 FINDINGS: VASCULAR Aorta: Unremarkable course, caliber, contour of the abdominal aorta. No dissection, aneurysm, or periaortic fluid. Mild to moderate atherosclerotic changes. No pedunculated plaque or ulcerated plaque. Celiac: Celiac artery patent with calcified plaque at the origin. On axial images degree of narrowing is estimated 50%. Branches remain patent with traditional contribution to the common hepatic artery, splenic artery, left gastric artery. SMA: SMA is patent with atherosclerotic changes at the origin contributing to estimated 50% narrowing on axial images. Branches are patent. Renals: - Right: Atherosclerotic changes at the origin of the right renal artery with 50% or less narrowing. - Left: Atherosclerotic changes at the origin of the more cranial left renal artery. Estimated greater than 50% narrowing. The more caudal left renal artery demonstrates no evidence of high-grade stenosis. IMA: Inferior mesenteric artery is patent. Right lower extremity: Unremarkable course, caliber, and contour of the right iliac system. No aneurysm, dissection, or occlusion. Mild atherosclerotic changes. Hypogastric artery is patent. Common femoral artery patent. Proximal SFA and profunda femoris patent. Left lower extremity: Unremarkable course, caliber, and contour of the left iliac system. No aneurysm, dissection, or occlusion. Mild atherosclerotic changes. Hypogastric artery is  patent. Common femoral artery patent. Proximal SFA and profunda  femoris patent. Veins: Unremarkable appearance of the venous system. Review of the MIP images confirms the above findings. NON-VASCULAR Lower chest: Atelectatic changes at the lung bases. Bronchiectasis in the lower lobes bilaterally. Hepatobiliary: Surgical changes of hepaticojejunostomy for reported bile duct injury and associated cholecystectomy. Mild gas within the biliary ducts of both left and right-sided ducts. Intrahepatic biliary ductal dilatation of the left-sided ducts, though not significantly increased from the comparison CT of 11/07/2021. To a lesser degree, and similar to prior, the right-sided ducts are also dilated. Pancreas: Unremarkable pancreas. Spleen: Greatest span of the spleen measures more than 13 cm. Adrenals/Urinary Tract: - Right adrenal gland: Unremarkable - Left adrenal gland: Unremarkable. - Right kidney: No hydronephrosis, nephrolithiasis, inflammation, or ureteral dilation. No focal lesion. - Left Kidney: No hydronephrosis, nephrolithiasis, inflammation, or ureteral dilation. No focal lesion. - Urinary Bladder: Unremarkable. Stomach/Bowel: - Stomach: Unremarkable. - Small bowel: Surgical changes of hepaticojejunostomy. No transition point. No focal wall thickening. No evidence of devascularization/ischemia. Mild fat edema within the right upper quadrant adjacent to the surgical site which was present on the comparison. - Appendix: Normal. - Colon: Left-sided colonic diverticular disease. No focal wall thickening or inflammatory changes. No evidence of bowel obstruction Lymphatic: Redemonstration of supra meso colic lymph nodes within the gastrohepatic ligament, hilum of the liver, portacaval nodal station. Enlarging lymph node in the pericardial station/epiphrenic nodal station. This node now measures 17 mm, with the estimated size on the CT 12/01/2019 of approximately 9 mm. Similar size and configuration of multiple  mesenteric lymph nodes throughout small bowel mesentery as well as retroperitoneal/para-aortic lymph nodes. Mesenteric: Trace low-density free fluid within the bilateral pericolic gutter, adjacent to the liver and spleen, and layering dependently in the pelvis. Reproductive: Asymmetric hyperenhancement at the right base of the prostate. Right-sided hydrocele. Other: No hernia. Musculoskeletal: Multilevel degenerative changes of the spine. Intraosseous hemangioma of T12 is been present over time. No aggressive sclerotic or lucent lesions identified. No acute displaced fracture. IMPRESSION: CT angiogram does not support mesenteric ischemia as a cause of acute symptoms, as there is patency of all 3 mesenteric arteries with only estimated 50% narrowing of both celiac artery and SMA. Redemonstration of surgical changes of hepaticojejunostomy for treatment of prior bile duct injury. There are relatively similar changes of left greater than right intrahepatic biliary dilatation and pneumobilia, with mild increased edema/inflammatory changes at the hilum of the liver. Ascending cholangitis not excluded and correlation with lab values may be useful. Over time there is increasing size of lymph nodes in the deep liver drainage pathway (epiphrenic/pericardial, portal caval, liver hilum, gastrohepatic ligament). Correlation with pancreaticobiliary tumor markers may be useful if there is concern for occult malignancy. Otherwise these may be reactive in the setting of chronic inflammation. Asymmetric hyperenhancement at the right base of the prostate. Prostate cancer not excluded, and correlation with physical exam and PSA recommended. Trace free fluid throughout the abdomen, presumably reactive given the patient's clinical history. Aortic Atherosclerosis (ICD10-I70.0). Right-sided hydrocele. Signed, Dulcy Fanny. Nadene Rubins, RPVI [facility] Radiology Electronically Signed   By:  Corrie Mckusick D.O.   On: 03/19/2022 17:17   DG Chest 2 View  Result Date: 03/19/2022 CLINICAL DATA:  Suspected sepsis EXAM: CHEST - 2 VIEW COMPARISON:  11/05/2021 FINDINGS: The heart size and mediastinal contours are within normal limits. Both lungs are clear. The visualized skeletal structures are unremarkable. IMPRESSION: No acute abnormality of the lungs. Electronically Signed   By: Jamse Mead.D.  On: 03/19/2022 12:31     PERTINENT LAB RESULTS: CBC: Recent Labs    03/19/22 1300 03/20/22 0347  WBC 5.6 13.2*  HGB 10.7* 8.1*  HCT 35.7* 24.9*  PLT 159 97*   CMET CMP     Component Value Date/Time   NA 140 03/20/2022 0347   K 3.4 (L) 03/20/2022 0347   CL 107 03/20/2022 0347   CO2 24 03/20/2022 0347   GLUCOSE 185 (H) 03/20/2022 0347   BUN 19 03/20/2022 0347   CREATININE 0.67 03/20/2022 0347   CREATININE 0.68 (L) 11/25/2021 1436   CALCIUM 8.3 (L) 03/20/2022 0347   PROT 5.3 (L) 03/20/2022 0347   ALBUMIN 2.0 (L) 03/20/2022 0347   AST 134 (H) 03/20/2022 0347   ALT 74 (H) 03/20/2022 0347   ALKPHOS 518 (H) 03/20/2022 0347   BILITOT 1.4 (H) 03/20/2022 0347   GFRNONAA >60 03/20/2022 0347   GFRNONAA 89 01/02/2021 1455   GFRAA 104 01/02/2021 1455    GFR Estimated Creatinine Clearance: 61.7 mL/min (by C-G formula based on SCr of 0.67 mg/dL). No results for input(s): "LIPASE", "AMYLASE" in the last 72 hours. No results for input(s): "CKTOTAL", "CKMB", "CKMBINDEX", "TROPONINI" in the last 72 hours. Invalid input(s): "POCBNP" No results for input(s): "DDIMER" in the last 72 hours. No results for input(s): "HGBA1C" in the last 72 hours. No results for input(s): "CHOL", "HDL", "LDLCALC", "TRIG", "CHOLHDL", "LDLDIRECT" in the last 72 hours. No results for input(s): "TSH", "T4TOTAL", "T3FREE", "THYROIDAB" in the last 72 hours.  Invalid input(s): "FREET3" No results for input(s): "VITAMINB12", "FOLATE", "FERRITIN", "TIBC", "IRON", "RETICCTPCT" in the last 72 hours. Coags: Recent  Labs    03/19/22 1300 03/20/22 0347  INR 1.4* 1.8*   Microbiology: Recent Results (from the past 240 hour(s))  Culture, blood (Routine x 2)     Status: None (Preliminary result)   Collection Time: 03/19/22 12:06 PM   Specimen: BLOOD  Result Value Ref Range Status   Specimen Description BLOOD BLOOD RIGHT FOREARM  Final   Special Requests   Final    BOTTLES DRAWN AEROBIC AND ANAEROBIC Blood Culture adequate volume   Culture  Setup Time   Final    GRAM NEGATIVE RODS IN BOTH AEROBIC AND ANAEROBIC BOTTLES CRITICAL VALUE NOTED.  VALUE IS CONSISTENT WITH PREVIOUSLY REPORTED AND CALLED VALUE. Performed at [facility], [place] [address], [place], [place] [address]    Culture PENDING  Incomplete   Report Status PENDING  Incomplete  Culture, blood (Routine x 2)     Status: None (Preliminary result)   Collection Time: 03/19/22 12:18 PM   Specimen: BLOOD LEFT FOREARM  Result Value Ref Range Status   Specimen Description BLOOD LEFT FOREARM  Final   Special Requests   Final    BOTTLES DRAWN AEROBIC AND ANAEROBIC Blood Culture results may not be optimal due to an inadequate volume of blood received in culture bottles   Culture  Setup Time   Final    GRAM NEGATIVE RODS IN BOTH AEROBIC AND ANAEROBIC BOTTLES CRITICAL RESULT CALLED TO, READ BACK BY AND VERIFIED WITH: T RUDISILL,[id number]  03/20/22 [place] Performed at [facility], [place] [address], [place], [place] [address]    Culture PENDING  Incomplete   Report Status PENDING  Incomplete  Blood Culture ID Panel (Reflexed)     Status: Abnormal   Collection Time: 03/19/22 12:18 PM  Result Value Ref Range Status   Enterococcus faecalis NOT DETECTED NOT DETECTED Final   Enterococcus Faecium NOT DETECTED  NOT DETECTED Final   Listeria monocytogenes NOT DETECTED NOT DETECTED Final   Staphylococcus species NOT DETECTED NOT DETECTED Final   Staphylococcus aureus (BCID) NOT DETECTED NOT DETECTED Final   Staphylococcus epidermidis NOT DETECTED  NOT DETECTED Final   Staphylococcus lugdunensis NOT DETECTED NOT DETECTED Final   Streptococcus species NOT DETECTED NOT DETECTED Final   Streptococcus agalactiae NOT DETECTED NOT DETECTED Final   Streptococcus pneumoniae NOT DETECTED NOT DETECTED Final   Streptococcus pyogenes NOT DETECTED NOT DETECTED Final   A.calcoaceticus-baumannii NOT DETECTED NOT DETECTED Final   Bacteroides fragilis NOT DETECTED NOT DETECTED Final   Enterobacterales DETECTED (A) NOT DETECTED Final    Comment: Enterobacterales represent a large order of gram negative bacteria, not a single organism. CRITICAL RESULT CALLED TO, READ BACK BY AND VERIFIED WITH: T RUDISILL,[email or web address]  03/20/22 [place]    Enterobacter cloacae complex NOT DETECTED NOT DETECTED Final   Escherichia coli NOT DETECTED NOT DETECTED Final   Klebsiella aerogenes NOT DETECTED NOT DETECTED Final   Klebsiella oxytoca NOT DETECTED NOT DETECTED Final   Klebsiella pneumoniae DETECTED (A) NOT DETECTED Final    Comment: CRITICAL RESULT CALLED TO, READ BACK BY AND VERIFIED WITH: T [email or web address]  03/20/22 [place]    Proteus species NOT DETECTED NOT DETECTED Final   Salmonella species NOT DETECTED NOT DETECTED Final   Serratia marcescens NOT DETECTED NOT DETECTED Final   Haemophilus influenzae NOT DETECTED NOT DETECTED Final   Neisseria meningitidis NOT DETECTED NOT DETECTED Final   Pseudomonas aeruginosa NOT DETECTED NOT DETECTED Final   Stenotrophomonas maltophilia NOT DETECTED NOT DETECTED Final   Candida albicans NOT DETECTED NOT DETECTED Final   Candida auris NOT DETECTED NOT DETECTED Final   Candida glabrata NOT DETECTED NOT DETECTED Final   Candida krusei NOT DETECTED NOT DETECTED Final   Candida parapsilosis NOT DETECTED NOT DETECTED Final   Candida tropicalis NOT DETECTED NOT DETECTED Final   Cryptococcus neoformans/gattii NOT DETECTED NOT DETECTED Final   CTX-M ESBL NOT DETECTED NOT DETECTED Final   Carbapenem resistance IMP NOT DETECTED NOT  DETECTED Final   Carbapenem resistance KPC NOT DETECTED NOT DETECTED Final   Carbapenem resistance NDM NOT DETECTED NOT DETECTED Final   Carbapenem resist OXA 48 LIKE NOT DETECTED NOT DETECTED Final   Carbapenem resistance VIM NOT DETECTED NOT DETECTED Final    Comment: Performed at [facility], [address], [place], [place] [address]  SARS Coronavirus 2 by RT PCR (hospital order, performed in [facility] hospital lab) *cepheid single result test* Anterior Nasal Swab     Status: None   Collection Time: 03/19/22  2:52 PM   Specimen: Anterior Nasal Swab  Result Value Ref Range Status   SARS Coronavirus 2 by RT PCR NEGATIVE NEGATIVE Final    Comment: (NOTE) SARS-CoV-2 target nucleic acids are NOT DETECTED.  The SARS-CoV-2 RNA is generally detectable in upper and lower respiratory specimens during the acute phase of infection. The lowest concentration of SARS-CoV-2 viral copies this assay can detect is 250 copies / mL. A negative result does not preclude SARS-CoV-2 infection and should not be used as the sole basis for treatment or other patient management decisions.  A negative result may occur with improper specimen collection / handling, submission of specimen other than nasopharyngeal swab, presence of viral mutation(s) within the areas targeted by this assay, and inadequate number of viral copies (<250 copies / mL). A negative result must be combined with clinical observations, patient history, and epidemiological information.  Fact Sheet  for Patients:   https://www.patel.info/  Fact Sheet for Healthcare Providers: https://hall.com/  This test is not yet approved or  cleared by the [place] FDA and has been authorized for detection and/or diagnosis of SARS-CoV-2 by FDA under an Emergency Use Authorization (EUA).  This EUA will remain in effect (meaning this test can be used) for the duration of the COVID-19 declaration  under Section 564(b)(1) of the Act, 21 U.S.C. section 360bbb-3(b)(1), unless the authorization is terminated or revoked sooner.  Performed at [facility], [place] [address], [place], [place] [address]     FURTHER DISCHARGE INSTRUCTIONS:  Get Medicines reviewed and adjusted: Please take all your medications with you for your next visit with your Primary MD  Laboratory/radiological data: Please request your Primary MD to go over all hospital tests and procedure/radiological results at the follow up, please ask your Primary MD to get all Hospital records sent to his/her office.  In some cases, they will be blood work, cultures and biopsy results pending at the time of your discharge. Please request that your primary care M.D. goes through all the records of your hospital data and follows up on these results.  Also Note the following: If you experience worsening of your admission symptoms, develop shortness of breath, life threatening emergency, suicidal or homicidal thoughts you must seek medical attention immediately by calling 911 or calling your MD immediately  if symptoms less severe.  You must read complete instructions/literature along with all the possible adverse reactions/side effects for all the Medicines you take and that have been prescribed to you. Take any new Medicines after you have completely understood and accpet all the possible adverse reactions/side effects.   Do not drive when taking Pain medications or sleeping medications (Benzodaizepines)  Do not take more than prescribed Pain, Sleep and Anxiety Medications. It is not advisable to combine anxiety,sleep and pain medications without talking with your primary care practitioner  Special Instructions: If you have smoked or chewed Tobacco  in the last 2 yrs please stop smoking, stop any regular Alcohol  and or any Recreational drug use.  Wear Seat belts while driving.  Please note: You were cared for by a  hospitalist during your hospital stay. Once you are discharged, your primary care physician will handle any further medical issues. Please note that NO REFILLS for any discharge medications will be authorized once you are discharged, as it is imperative that you return to your primary care physician (or establish a relationship with a primary care physician if you do not have one) for your post hospital discharge needs so that they can reassess your need for medications and monitor your lab values.  Total Time spent coordinating discharge including counseling, education and face to face time equals greater than 30 minutes.  SignedOren Binet 03/20/2022 11:47 AM

## 2022-03-20 NOTE — TOC Transition Note (Addendum)
Transition of Care [facility]) - CM/SW Discharge Note   Patient Details  Name: Randy Thomas MRN: [id number] Date of Birth: [birth year]  Transition of Care [facility]) CM/SW Contact:  Verdell Carmine, RN Phone Number: 03/20/2022, 11:55 AM   Clinical Narrative:     Patient transferring to [facility] , this is where his original surgery was done. Accepting and just called with room number. Will  need to go by [facility].  [facility] called , however do not have capacity to transport at this time. They directed this CM to [place] for transport. [phone]. [facility], they will pick up in 35-40 minutes. Going to [facility] room 5 .        Patient Goals and CMS Choice        Discharge Placement             Transfer to [facility].           Discharge Plan and Services                                     Social Determinants of Health (SDOH) Interventions     Readmission Risk Interventions     No data to display

## 2022-03-20 NOTE — Progress Notes (Signed)
Order received to discharge/transfer patient to [facility] for consultation with his previous Psychologist, sport and exercise.  Telemetry monitor removed and CCMD notified.  Report called to Anderson Malta, receiving RN.  PIV x2 were left in place per request of receiving RN.  No fluids running during transport per receiving RN. Transport from [facility] left with patient and patient's granddaughter.

## 2022-03-20 NOTE — Progress Notes (Signed)
PHARMACY - PHYSICIAN COMMUNICATION CRITICAL VALUE ALERT - BLOOD CULTURE IDENTIFICATION (BCID)  Randy Thomas is an 86 y.o. male who presented to [facility] on 03/19/2022 with a chief complaint of fevers and abdominal pain; patient is awaiting transfer to [facility]   Assessment: 3/4 positive for fevers and abdominal pain  Name of physician (or Provider) Contacted: [place]  Current antibiotics: zosyn  Changes to prescribed antibiotics recommended:  Patient is on recommended antibiotics - No changes needed  Results for orders placed or performed during the hospital encounter of 03/19/22  Blood Culture ID Panel (Reflexed) (Collected: 03/19/2022 12:18 PM)  Result Value Ref Range   Enterococcus faecalis NOT DETECTED NOT DETECTED   Enterococcus Faecium NOT DETECTED NOT DETECTED   Listeria monocytogenes NOT DETECTED NOT DETECTED   Staphylococcus species NOT DETECTED NOT DETECTED   Staphylococcus aureus (BCID) NOT DETECTED NOT DETECTED   Staphylococcus epidermidis NOT DETECTED NOT DETECTED   Staphylococcus lugdunensis NOT DETECTED NOT DETECTED   Streptococcus species NOT DETECTED NOT DETECTED   Streptococcus agalactiae NOT DETECTED NOT DETECTED   Streptococcus pneumoniae NOT DETECTED NOT DETECTED   Streptococcus pyogenes NOT DETECTED NOT DETECTED   A.calcoaceticus-baumannii NOT DETECTED NOT DETECTED   Bacteroides fragilis NOT DETECTED NOT DETECTED   Enterobacterales DETECTED (A) NOT DETECTED   Enterobacter cloacae complex NOT DETECTED NOT DETECTED   Escherichia coli NOT DETECTED NOT DETECTED   Klebsiella aerogenes NOT DETECTED NOT DETECTED   Klebsiella oxytoca NOT DETECTED NOT DETECTED   Klebsiella pneumoniae DETECTED (A) NOT DETECTED   Proteus species NOT DETECTED NOT DETECTED   Salmonella species NOT DETECTED NOT DETECTED   Serratia marcescens NOT DETECTED NOT DETECTED   Haemophilus influenzae NOT DETECTED NOT DETECTED   Neisseria meningitidis NOT DETECTED NOT DETECTED   Pseudomonas  aeruginosa NOT DETECTED NOT DETECTED   Stenotrophomonas maltophilia NOT DETECTED NOT DETECTED   Candida albicans NOT DETECTED NOT DETECTED   Candida auris NOT DETECTED NOT DETECTED   Candida glabrata NOT DETECTED NOT DETECTED   Candida krusei NOT DETECTED NOT DETECTED   Candida parapsilosis NOT DETECTED NOT DETECTED   Candida tropicalis NOT DETECTED NOT DETECTED   Cryptococcus neoformans/gattii NOT DETECTED NOT DETECTED   CTX-M ESBL NOT DETECTED NOT DETECTED   Carbapenem resistance IMP NOT DETECTED NOT DETECTED   Carbapenem resistance KPC NOT DETECTED NOT DETECTED   Carbapenem resistance NDM NOT DETECTED NOT DETECTED   Carbapenem resist OXA 48 LIKE NOT DETECTED NOT DETECTED   Carbapenem resistance VIM NOT DETECTED NOT DETECTED    Nicole Kindred L Christl Fessenden 03/20/2022  3:26 AM

## 2022-03-20 NOTE — Progress Notes (Signed)
Received call from [facility] admitting to get information concerning transferring doctor. Pt is on wait list. Randy Thomas

## 2022-03-22 LAB — CULTURE, BLOOD (ROUTINE X 2): Special Requests: ADEQUATE

## 2022-03-24 DIAGNOSIS — K831 Obstruction of bile duct: Secondary | ICD-10-CM | POA: Diagnosis not present

## 2022-03-24 DIAGNOSIS — K9189 Other postprocedural complications and disorders of digestive system: Secondary | ICD-10-CM | POA: Diagnosis not present

## 2022-03-25 DIAGNOSIS — K831 Obstruction of bile duct: Secondary | ICD-10-CM | POA: Diagnosis not present

## 2022-04-11 DIAGNOSIS — R600 Localized edema: Secondary | ICD-10-CM | POA: Insufficient documentation

## 2022-04-11 DIAGNOSIS — E43 Unspecified severe protein-calorie malnutrition: Secondary | ICD-10-CM | POA: Insufficient documentation

## 2022-04-14 ENCOUNTER — Telehealth: Payer: Self-pay | Admitting: Family Medicine

## 2022-04-14 ENCOUNTER — Other Ambulatory Visit: Payer: Self-pay | Admitting: Family Medicine

## 2022-04-14 DIAGNOSIS — B9620 Unspecified Escherichia coli [E. coli] as the cause of diseases classified elsewhere: Secondary | ICD-10-CM

## 2022-04-14 NOTE — Telephone Encounter (Signed)
Daughter called states that Dr. Carlis Abbott, surgeon at [facility] is requesting a BMP for Kidney Function test to be done before his appt with him on 04/21/2022. Dr. Dennard Schaumann can you order this for the patient or does he need to be seen by you first.  Please advise.  CB# [phone]

## 2022-04-15 ENCOUNTER — Other Ambulatory Visit: Payer: Medicare HMO

## 2022-04-15 DIAGNOSIS — B9620 Unspecified Escherichia coli [E. coli] as the cause of diseases classified elsewhere: Secondary | ICD-10-CM

## 2022-04-15 DIAGNOSIS — R7881 Bacteremia: Secondary | ICD-10-CM | POA: Diagnosis not present

## 2022-04-15 NOTE — Telephone Encounter (Signed)
Call pt aware of lab ordered per Dr. Dennard Schaumann.

## 2022-04-16 LAB — BASIC METABOLIC PANEL
BUN/Creatinine Ratio: 28 (calc) — ABNORMAL HIGH (ref 6–22)
BUN: 17 mg/dL (ref 7–25)
CO2: 25 mmol/L (ref 20–32)
Calcium: 7.7 mg/dL — ABNORMAL LOW (ref 8.6–10.3)
Chloride: 102 mmol/L (ref 98–110)
Creat: 0.6 mg/dL — ABNORMAL LOW (ref 0.70–1.22)
Glucose, Bld: 96 mg/dL (ref 65–99)
Potassium: 4 mmol/L (ref 3.5–5.3)
Sodium: 136 mmol/L (ref 135–146)

## 2022-04-21 DIAGNOSIS — S3613XD Injury of bile duct, subsequent encounter: Secondary | ICD-10-CM | POA: Diagnosis not present

## 2022-04-21 DIAGNOSIS — E43 Unspecified severe protein-calorie malnutrition: Secondary | ICD-10-CM | POA: Diagnosis not present

## 2022-04-21 DIAGNOSIS — L89152 Pressure ulcer of sacral region, stage 2: Secondary | ICD-10-CM | POA: Diagnosis not present

## 2022-04-21 DIAGNOSIS — Z4682 Encounter for fitting and adjustment of non-vascular catheter: Secondary | ICD-10-CM | POA: Diagnosis not present

## 2022-04-21 DIAGNOSIS — K8309 Other cholangitis: Secondary | ICD-10-CM | POA: Diagnosis not present

## 2022-04-24 ENCOUNTER — Other Ambulatory Visit: Payer: Medicare HMO

## 2022-04-24 DIAGNOSIS — K819 Cholecystitis, unspecified: Secondary | ICD-10-CM | POA: Diagnosis not present

## 2022-04-24 DIAGNOSIS — I252 Old myocardial infarction: Secondary | ICD-10-CM | POA: Diagnosis not present

## 2022-04-24 DIAGNOSIS — E43 Unspecified severe protein-calorie malnutrition: Secondary | ICD-10-CM | POA: Diagnosis not present

## 2022-04-24 DIAGNOSIS — E785 Hyperlipidemia, unspecified: Secondary | ICD-10-CM | POA: Diagnosis not present

## 2022-04-24 DIAGNOSIS — K219 Gastro-esophageal reflux disease without esophagitis: Secondary | ICD-10-CM | POA: Diagnosis not present

## 2022-04-24 DIAGNOSIS — I10 Essential (primary) hypertension: Secondary | ICD-10-CM | POA: Diagnosis not present

## 2022-04-24 DIAGNOSIS — K7589 Other specified inflammatory liver diseases: Secondary | ICD-10-CM | POA: Diagnosis not present

## 2022-04-24 DIAGNOSIS — I2510 Atherosclerotic heart disease of native coronary artery without angina pectoris: Secondary | ICD-10-CM | POA: Diagnosis not present

## 2022-04-24 DIAGNOSIS — L89322 Pressure ulcer of left buttock, stage 2: Secondary | ICD-10-CM | POA: Diagnosis not present

## 2022-04-24 DIAGNOSIS — Z87442 Personal history of urinary calculi: Secondary | ICD-10-CM | POA: Diagnosis not present

## 2022-04-24 DIAGNOSIS — K831 Obstruction of bile duct: Secondary | ICD-10-CM | POA: Diagnosis not present

## 2022-04-25 ENCOUNTER — Telehealth: Payer: Self-pay

## 2022-04-25 LAB — HEPATIC FUNCTION PANEL
AG Ratio: 0.7 (calc) — ABNORMAL LOW (ref 1.0–2.5)
ALT: 77 U/L — ABNORMAL HIGH (ref 9–46)
AST: 313 U/L — ABNORMAL HIGH (ref 10–35)
Albumin: 2.3 g/dL — ABNORMAL LOW (ref 3.6–5.1)
Alkaline phosphatase (APISO): 1310 U/L — ABNORMAL HIGH (ref 35–144)
Bilirubin, Direct: 1.2 mg/dL — ABNORMAL HIGH (ref 0.0–0.2)
Globulin: 3.4 g/dL (calc) (ref 1.9–3.7)
Indirect Bilirubin: 0.8 mg/dL (calc) (ref 0.2–1.2)
Total Bilirubin: 2 mg/dL — ABNORMAL HIGH (ref 0.2–1.2)
Total Protein: 5.7 g/dL — ABNORMAL LOW (ref 6.1–8.1)

## 2022-04-25 NOTE — Telephone Encounter (Signed)
[place], [place] Triage nurse for w/Dr. Tracey Harries Surgery-ordering lab Dr.-  [facility] [place] of pt's lab results. Request copy be sent to Dr. Jacqulyn Liner office.   Labs already sent.

## 2022-04-25 NOTE — Telephone Encounter (Signed)
Called this morning, spoke w/pt's daughter-Maxine regarding lab results.   Daughter would like to know what does it mean when AST/Alkaline Phos are high?

## 2022-04-25 NOTE — Telephone Encounter (Signed)
FYI:  [place] requesting [facility] care orders. Pt has a woud on his butt measuring 1cmx0.3x0.1  Oders:  1wk ror 8wk for nursing 2PRN visit woud care  'ok' per Dr. Dennard Schaumann for [facility] care orders requested

## 2022-05-01 ENCOUNTER — Telehealth: Payer: Self-pay

## 2022-05-01 ENCOUNTER — Other Ambulatory Visit: Payer: Self-pay | Admitting: Family Medicine

## 2022-05-01 MED ORDER — TRAMADOL HCL 50 MG PO TABS: 50.0000 mg | ORAL_TABLET | Freq: Three times a day (TID) | ORAL | 0 refills | Status: AC | PRN

## 2022-05-01 NOTE — Telephone Encounter (Signed)
Pt's spouse called in requesting a refill of HYDROcodone-acetaminophen (NORCO/VICODIN) 5-325 MG per tablet [[id number]]  DISCONTINUED.  LOV: 11/25/21  Please call pt's wife to verify pharmacy  Cb#: [phone]

## 2022-05-01 NOTE — Telephone Encounter (Signed)
Call pt, wife is aware Rx is sent to pharmacy.

## 2022-05-01 NOTE — Telephone Encounter (Signed)
Pt's wife called, stating that pt is in some pain due to his surgery a few ago. Would like to know if you can send RX for pain?  Pls advice?

## 2022-05-04 DIAGNOSIS — K922 Gastrointestinal hemorrhage, unspecified: Secondary | ICD-10-CM | POA: Diagnosis not present

## 2022-05-04 DIAGNOSIS — L89152 Pressure ulcer of sacral region, stage 2: Secondary | ICD-10-CM | POA: Diagnosis not present

## 2022-05-04 DIAGNOSIS — R188 Other ascites: Secondary | ICD-10-CM | POA: Diagnosis not present

## 2022-05-04 DIAGNOSIS — D500 Iron deficiency anemia secondary to blood loss (chronic): Secondary | ICD-10-CM | POA: Diagnosis not present

## 2022-05-04 DIAGNOSIS — R0602 Shortness of breath: Secondary | ICD-10-CM | POA: Diagnosis not present

## 2022-05-04 DIAGNOSIS — K8309 Other cholangitis: Secondary | ICD-10-CM | POA: Diagnosis not present

## 2022-05-04 DIAGNOSIS — R195 Other fecal abnormalities: Secondary | ICD-10-CM | POA: Diagnosis not present

## 2022-05-04 DIAGNOSIS — R140 Abdominal distension (gaseous): Secondary | ICD-10-CM | POA: Diagnosis not present

## 2022-05-04 DIAGNOSIS — T85898A Other specified complication of other internal prosthetic devices, implants and grafts, initial encounter: Secondary | ICD-10-CM | POA: Diagnosis not present

## 2022-05-04 DIAGNOSIS — R918 Other nonspecific abnormal finding of lung field: Secondary | ICD-10-CM | POA: Diagnosis not present

## 2022-05-04 DIAGNOSIS — I2510 Atherosclerotic heart disease of native coronary artery without angina pectoris: Secondary | ICD-10-CM | POA: Diagnosis not present

## 2022-05-04 DIAGNOSIS — I10 Essential (primary) hypertension: Secondary | ICD-10-CM | POA: Diagnosis not present

## 2022-05-04 DIAGNOSIS — D62 Acute posthemorrhagic anemia: Secondary | ICD-10-CM | POA: Diagnosis not present

## 2022-05-04 DIAGNOSIS — R6510 Systemic inflammatory response syndrome (SIRS) of non-infectious origin without acute organ dysfunction: Secondary | ICD-10-CM | POA: Diagnosis not present

## 2022-05-04 DIAGNOSIS — J984 Other disorders of lung: Secondary | ICD-10-CM | POA: Diagnosis not present

## 2022-05-04 DIAGNOSIS — R768 Other specified abnormal immunological findings in serum: Secondary | ICD-10-CM | POA: Diagnosis not present

## 2022-05-04 DIAGNOSIS — S3613XD Injury of bile duct, subsequent encounter: Secondary | ICD-10-CM | POA: Diagnosis not present

## 2022-05-04 DIAGNOSIS — S35291A Minor laceration of branches of celiac and mesenteric artery, initial encounter: Secondary | ICD-10-CM | POA: Diagnosis not present

## 2022-05-04 DIAGNOSIS — K219 Gastro-esophageal reflux disease without esophagitis: Secondary | ICD-10-CM | POA: Diagnosis not present

## 2022-05-04 DIAGNOSIS — J90 Pleural effusion, not elsewhere classified: Secondary | ICD-10-CM | POA: Diagnosis not present

## 2022-05-04 DIAGNOSIS — A419 Sepsis, unspecified organism: Secondary | ICD-10-CM | POA: Diagnosis not present

## 2022-05-04 DIAGNOSIS — Z4682 Encounter for fitting and adjustment of non-vascular catheter: Secondary | ICD-10-CM | POA: Diagnosis not present

## 2022-05-04 DIAGNOSIS — I252 Old myocardial infarction: Secondary | ICD-10-CM | POA: Diagnosis not present

## 2022-05-04 DIAGNOSIS — I728 Aneurysm of other specified arteries: Secondary | ICD-10-CM | POA: Diagnosis not present

## 2022-05-04 DIAGNOSIS — E785 Hyperlipidemia, unspecified: Secondary | ICD-10-CM | POA: Diagnosis not present

## 2022-05-04 DIAGNOSIS — E43 Unspecified severe protein-calorie malnutrition: Secondary | ICD-10-CM | POA: Diagnosis not present

## 2022-05-04 DIAGNOSIS — T85590A Other mechanical complication of bile duct prosthesis, initial encounter: Secondary | ICD-10-CM | POA: Diagnosis not present

## 2022-05-05 DIAGNOSIS — D62 Acute posthemorrhagic anemia: Secondary | ICD-10-CM | POA: Diagnosis not present

## 2022-05-05 DIAGNOSIS — T85590A Other mechanical complication of bile duct prosthesis, initial encounter: Secondary | ICD-10-CM | POA: Diagnosis not present

## 2022-05-05 DIAGNOSIS — R0602 Shortness of breath: Secondary | ICD-10-CM | POA: Diagnosis not present

## 2022-05-05 DIAGNOSIS — R918 Other nonspecific abnormal finding of lung field: Secondary | ICD-10-CM | POA: Diagnosis not present

## 2022-05-05 DIAGNOSIS — S3613XD Injury of bile duct, subsequent encounter: Secondary | ICD-10-CM | POA: Diagnosis not present

## 2022-05-05 DIAGNOSIS — R768 Other specified abnormal immunological findings in serum: Secondary | ICD-10-CM | POA: Diagnosis not present

## 2022-05-05 DIAGNOSIS — K8309 Other cholangitis: Secondary | ICD-10-CM | POA: Diagnosis not present

## 2022-05-05 DIAGNOSIS — T85898A Other specified complication of other internal prosthetic devices, implants and grafts, initial encounter: Secondary | ICD-10-CM | POA: Diagnosis not present

## 2022-05-05 DIAGNOSIS — S35291A Minor laceration of branches of celiac and mesenteric artery, initial encounter: Secondary | ICD-10-CM | POA: Diagnosis not present

## 2022-05-05 DIAGNOSIS — J90 Pleural effusion, not elsewhere classified: Secondary | ICD-10-CM | POA: Diagnosis not present

## 2022-05-05 DIAGNOSIS — R195 Other fecal abnormalities: Secondary | ICD-10-CM | POA: Diagnosis not present

## 2022-05-05 DIAGNOSIS — R140 Abdominal distension (gaseous): Secondary | ICD-10-CM | POA: Diagnosis not present

## 2022-05-05 DIAGNOSIS — R188 Other ascites: Secondary | ICD-10-CM | POA: Diagnosis not present

## 2022-05-05 DIAGNOSIS — I10 Essential (primary) hypertension: Secondary | ICD-10-CM | POA: Diagnosis not present

## 2022-05-06 DIAGNOSIS — I252 Old myocardial infarction: Secondary | ICD-10-CM | POA: Diagnosis not present

## 2022-05-06 DIAGNOSIS — I2510 Atherosclerotic heart disease of native coronary artery without angina pectoris: Secondary | ICD-10-CM | POA: Diagnosis not present

## 2022-05-06 DIAGNOSIS — I10 Essential (primary) hypertension: Secondary | ICD-10-CM | POA: Diagnosis not present

## 2022-05-06 DIAGNOSIS — E785 Hyperlipidemia, unspecified: Secondary | ICD-10-CM | POA: Diagnosis not present

## 2022-05-06 DIAGNOSIS — R768 Other specified abnormal immunological findings in serum: Secondary | ICD-10-CM | POA: Insufficient documentation

## 2022-05-06 DIAGNOSIS — J984 Other disorders of lung: Secondary | ICD-10-CM | POA: Diagnosis not present

## 2022-05-06 DIAGNOSIS — D62 Acute posthemorrhagic anemia: Secondary | ICD-10-CM | POA: Diagnosis not present

## 2022-05-06 DIAGNOSIS — E43 Unspecified severe protein-calorie malnutrition: Secondary | ICD-10-CM | POA: Diagnosis not present

## 2022-05-06 DIAGNOSIS — K219 Gastro-esophageal reflux disease without esophagitis: Secondary | ICD-10-CM | POA: Diagnosis not present

## 2022-05-06 DIAGNOSIS — K922 Gastrointestinal hemorrhage, unspecified: Secondary | ICD-10-CM | POA: Diagnosis not present

## 2022-05-07 DIAGNOSIS — R768 Other specified abnormal immunological findings in serum: Secondary | ICD-10-CM | POA: Diagnosis not present

## 2022-05-07 DIAGNOSIS — R188 Other ascites: Secondary | ICD-10-CM | POA: Diagnosis not present

## 2022-05-07 DIAGNOSIS — T85590A Other mechanical complication of bile duct prosthesis, initial encounter: Secondary | ICD-10-CM | POA: Diagnosis not present

## 2022-05-07 DIAGNOSIS — S35291A Minor laceration of branches of celiac and mesenteric artery, initial encounter: Secondary | ICD-10-CM | POA: Diagnosis not present

## 2022-05-07 DIAGNOSIS — D62 Acute posthemorrhagic anemia: Secondary | ICD-10-CM | POA: Diagnosis not present

## 2022-05-08 DIAGNOSIS — S35291A Minor laceration of branches of celiac and mesenteric artery, initial encounter: Secondary | ICD-10-CM | POA: Diagnosis not present

## 2022-05-08 DIAGNOSIS — R188 Other ascites: Secondary | ICD-10-CM | POA: Diagnosis not present

## 2022-05-08 DIAGNOSIS — T85590A Other mechanical complication of bile duct prosthesis, initial encounter: Secondary | ICD-10-CM | POA: Diagnosis not present

## 2022-05-08 DIAGNOSIS — R768 Other specified abnormal immunological findings in serum: Secondary | ICD-10-CM | POA: Diagnosis not present

## 2022-05-08 DIAGNOSIS — D62 Acute posthemorrhagic anemia: Secondary | ICD-10-CM | POA: Diagnosis not present

## 2022-05-09 DIAGNOSIS — R768 Other specified abnormal immunological findings in serum: Secondary | ICD-10-CM | POA: Diagnosis not present

## 2022-05-09 DIAGNOSIS — S35291A Minor laceration of branches of celiac and mesenteric artery, initial encounter: Secondary | ICD-10-CM | POA: Diagnosis not present

## 2022-05-09 DIAGNOSIS — D62 Acute posthemorrhagic anemia: Secondary | ICD-10-CM | POA: Diagnosis not present

## 2022-05-09 DIAGNOSIS — T85590A Other mechanical complication of bile duct prosthesis, initial encounter: Secondary | ICD-10-CM | POA: Diagnosis not present

## 2022-05-09 DIAGNOSIS — R188 Other ascites: Secondary | ICD-10-CM | POA: Diagnosis not present

## 2022-05-12 DIAGNOSIS — D62 Acute posthemorrhagic anemia: Secondary | ICD-10-CM | POA: Diagnosis not present

## 2022-05-12 DIAGNOSIS — K8309 Other cholangitis: Secondary | ICD-10-CM | POA: Diagnosis not present

## 2022-05-12 DIAGNOSIS — Z4682 Encounter for fitting and adjustment of non-vascular catheter: Secondary | ICD-10-CM | POA: Diagnosis not present

## 2022-05-15 ENCOUNTER — Telehealth: Payer: Self-pay

## 2022-05-15 NOTE — Telephone Encounter (Signed)
Cecille Rubin, RN with [facility] called requesting orders for wound care 1x/week for 3 weeks.  Cecille Rubin also states patient is having issues with swelling to groin since discharge from hospital on 05/11/22.  I called and spoke to pt's wife to verify what is going on with patient. [name] states he is having swelling but is currently on an abx and Lasix 20 mg. I went ahead and made an appointment for Randy Thomas for 05/16/22 @ 9:15 am.

## 2022-05-16 ENCOUNTER — Ambulatory Visit (INDEPENDENT_AMBULATORY_CARE_PROVIDER_SITE_OTHER): Payer: Medicare HMO | Admitting: Family Medicine

## 2022-05-16 VITALS — BP 130/68 | HR 102 | Temp 98.2°F | Wt 158.0 lb

## 2022-05-16 DIAGNOSIS — M7989 Other specified soft tissue disorders: Secondary | ICD-10-CM | POA: Diagnosis not present

## 2022-05-16 DIAGNOSIS — R601 Generalized edema: Secondary | ICD-10-CM | POA: Diagnosis not present

## 2022-05-16 DIAGNOSIS — S3613XS Injury of bile duct, sequela: Secondary | ICD-10-CM

## 2022-05-16 MED ORDER — POTASSIUM CHLORIDE CRYS ER 20 MEQ PO TBCR: 20.0000 meq | EXTENDED_RELEASE_TABLET | Freq: Every day | ORAL | 3 refills | Status: AC

## 2022-05-16 MED ORDER — FUROSEMIDE 40 MG PO TABS: 40.0000 mg | ORAL_TABLET | Freq: Two times a day (BID) | ORAL | 3 refills | Status: DC

## 2022-05-16 NOTE — Progress Notes (Signed)
Subjective: Scrotum is tense and swollen with fluid    Patient ID: Randy Thomas, male    DOB: 1936-06-27, 86 y.o.   MRN: [id number] Patient is a very pleasant 86 year old Caucasian gentleman who presents today with scrotal swelling.  He has +3 pitting edema from his feet all the way up to his waist.  He has ascites in his abdomen.  His scrotum is tense and swollen with fluid.  He has a very complicated past medical history.  Patient underwent a cholecystectomy in the past due to gallstone pancreatitis.  This was complicated by damage to the cystic duct.  He was referred to [facility] and had a reconstruction of the cystic duct.  This is subsequently developed scar tissue and stenosis.  Recently at [facility] due to cholangitis, the patient had a stent put in the cystic duct.  This was complicated by postoperative hemorrhage.  He had to have this surgically corrected.  Needless to say, the patient has been in the hospital several times over the last month receiving transfusions and IV fluid.  He presents today with significant third spacing edema.  About 4 days ago he was started on Lasix 20 mg a day but so far this has been unsuccessful.   Past Medical History:  Diagnosis Date   Allergy    GERD (gastroesophageal reflux disease)    Hard of hearing    Hypercholesteremia    Hyperlipidemia    Hypertension    Myocardial infarction ([place])    X 2 stents   Past Surgical History:  Procedure Laterality Date   CARDIAC CATHETERIZATION     X 2 stents   CHOLECYSTECTOMY N/A 05/20/2020   Procedure: LAPAROSCOPIC CHOLECYSTECTOMY;  Surgeon: [name], MD;  Location: [facility] ORS;  Service: General;  Laterality: N/A;   ERCP N/A 05/18/2020   Procedure: ENDOSCOPIC RETROGRADE CHOLANGIOPANCREATOGRAPHY (ERCP) ;  Surgeon: Rogene Houston, MD;  Location: [facility] ENDO SUITE;  Service: Endoscopy;  Laterality: N/A;   ERCP N/A 05/20/2020   Procedure: ENDOSCOPIC RETROGRADE CHOLANGIOPANCREATOGRAPHY (ERCP);  Surgeon: [name], MD;  Location: [facility] ORS;  Service: General;  Laterality: N/A;   Brutus wore cast   SPHINCTEROTOMY  05/18/2020   Procedure: SPHINCTEROTOMY mild;  Surgeon: Rogene Houston, MD;  Location: [facility] END[facility] SUITE;  Service: Endoscopy;;   TONSILLECTOMY     as a child   Current Outpatient Medications on File Prior to Visit  Medication Sig Dispense Refill   HYDROcodone-acetaminophen (NORCO/VICODIN) 5-325 MG tablet Take 1 tablet by mouth every 6 (six) hours as needed for moderate pain.     ipratropium-albuterol (DUONEB) 0.5-2.5 (3) MG/3ML SOLN Take 3 mLs by nebulization every 4 (four) hours as needed. 360 mL    lactated ringers infusion 100 cc/hr 250 mL 0   piperacillin-tazobactam (ZOSYN) 3.375 GM/50ML IVPB Inject 50 mLs (3.375 g total) into the vein every 8 (eight) hours. 50 mL    traMADol (ULTRAM) 50 MG tablet Take 1 tablet (50 mg total) by mouth every 8 (eight) hours as needed. 30 tablet 0   VASCEPA 1 g capsule Take 1 capsule by mouth in the morning and at bedtime.     No current facility-administered medications on file prior to visit.   No Known Allergies  Social History   Socioeconomic History   Marital status: Married    Spouse name: Not on file   Number of children: Not on file   Years of education: Not on file  Highest education level: Not on file  Occupational History   Occupation: retired    Comment: truck driver  Tobacco Use   Smoking status: Former    Types: Cigarettes    Quit date: 09/15/1981    Years since quitting: 40.6   Smokeless tobacco: Never  Vaping Use   Vaping Use: Never used  Substance and Sexual Activity   Alcohol use: No    Alcohol/week: 0.0 standard drinks of alcohol   Drug use: No   Sexual activity: Not Currently  Other Topics Concern   Not on file  Social History Narrative   Not on file   Social Determinants of Health   Financial Resource Strain: Not on file  Food Insecurity: Not on file  Transportation Needs: Not on file   Physical Activity: Not on file  Stress: Not on file  Social Connections: Not on file  Intimate Partner Violence: Not on file    Review of Systems  Constitutional:  Positive for fever.  All other systems reviewed and are negative.      Objective:   Physical Exam Vitals reviewed.  Constitutional:      Appearance: Normal appearance. He is normal weight. He is not ill-appearing or diaphoretic.  HENT:     Mouth/Throat:     Mouth: Mucous membranes are moist.  Cardiovascular:     Rate and Rhythm: Normal rate and regular rhythm.     Heart sounds: Normal heart sounds. No murmur heard.    No friction rub. No gallop.  Pulmonary:     Effort: Pulmonary effort is normal. No respiratory distress.     Breath sounds: Normal breath sounds. No stridor. No wheezing, rhonchi or rales.  Abdominal:     General: Abdomen is flat and protuberant. Bowel sounds are normal. There is no distension.     Palpations: Abdomen is soft. There is fluid wave.     Tenderness: There is no abdominal tenderness. There is no guarding or rebound.  Genitourinary:   Musculoskeletal:     Right lower leg: Edema present.     Left lower leg: Edema present.  Neurological:     Mental Status: He is alert.           Assessment & Plan:  Leg swelling - Plan: CBC with Differential/Platelet, COMPLETE METABOLIC PANEL WITH GFR  Common bile duct transection, sequela  Anasarca Patient has significant anasarca.  The question is whether this is due to third spacing edema from IV fluids during his hospitalization, or due to cirrhosis due to chronic liver damage, or due to congestive heart failure.  Uncertain at this time.  Increase Lasix to 40 mg twice daily and add Kdur 20 meq daily.  Recheck the first of next week.  If he is not experiencing diuresis with this, we may need to switch to Bumex as he may have a difficult time absorbing Lasix due to edema in the gastrointestinal wall.  Consider evaluation for cirrhosis because  if present he may benefit from spironolactone.

## 2022-05-17 LAB — COMPLETE METABOLIC PANEL WITH GFR
AG Ratio: 0.7 (calc) — ABNORMAL LOW (ref 1.0–2.5)
ALT: 36 U/L (ref 9–46)
AST: 128 U/L — ABNORMAL HIGH (ref 10–35)
Albumin: 2.4 g/dL — ABNORMAL LOW (ref 3.6–5.1)
Alkaline phosphatase (APISO): 1122 U/L — ABNORMAL HIGH (ref 35–144)
BUN/Creatinine Ratio: 25 (calc) — ABNORMAL HIGH (ref 6–22)
BUN: 13 mg/dL (ref 7–25)
CO2: 24 mmol/L (ref 20–32)
Calcium: 7.8 mg/dL — ABNORMAL LOW (ref 8.6–10.3)
Chloride: 102 mmol/L (ref 98–110)
Creat: 0.51 mg/dL — ABNORMAL LOW (ref 0.70–1.22)
Globulin: 3.3 g/dL (calc) (ref 1.9–3.7)
Glucose, Bld: 102 mg/dL — ABNORMAL HIGH (ref 65–99)
Potassium: 3.9 mmol/L (ref 3.5–5.3)
Sodium: 136 mmol/L (ref 135–146)
Total Bilirubin: 1.6 mg/dL — ABNORMAL HIGH (ref 0.2–1.2)
Total Protein: 5.7 g/dL — ABNORMAL LOW (ref 6.1–8.1)
eGFR: 100 mL/min/{1.73_m2} (ref >=60)

## 2022-05-17 LAB — CBC WITH DIFFERENTIAL/PLATELET
Absolute Monocytes: 884 cells/uL (ref 200–950)
Basophils Absolute: 124 cells/uL (ref 0–200)
Basophils Relative: 1.3 %
Eosinophils Absolute: 323 cells/uL (ref 15–500)
Eosinophils Relative: 3.4 %
HCT: 28.9 % — ABNORMAL LOW (ref 38.5–50.0)
Hemoglobin: 9 g/dL — ABNORMAL LOW (ref 13.2–17.1)
Lymphs Abs: 1017 cells/uL (ref 850–3900)
MCH: 27.9 pg (ref 27.0–33.0)
MCHC: 31.1 g/dL — ABNORMAL LOW (ref 32.0–36.0)
MCV: 89.5 fL (ref 80.0–100.0)
MPV: 12.5 fL (ref 7.5–12.5)
Monocytes Relative: 9.3 %
Neutro Abs: 7154 cells/uL (ref 1500–7800)
Neutrophils Relative %: 75.3 %
Platelets: 138 10*3/uL — ABNORMAL LOW (ref 140–400)
RBC: 3.23 10*6/uL — ABNORMAL LOW (ref 4.20–5.80)
RDW: 15.3 % — ABNORMAL HIGH (ref 11.0–15.0)
Total Lymphocyte: 10.7 %
WBC: 9.5 10*3/uL (ref 3.8–10.8)

## 2022-05-20 ENCOUNTER — Ambulatory Visit (INDEPENDENT_AMBULATORY_CARE_PROVIDER_SITE_OTHER): Payer: Medicare HMO | Admitting: Family Medicine

## 2022-05-20 ENCOUNTER — Other Ambulatory Visit: Payer: Self-pay | Admitting: Family Medicine

## 2022-05-20 ENCOUNTER — Ambulatory Visit: Payer: Medicare HMO | Admitting: Family Medicine

## 2022-05-20 VITALS — BP 110/52 | HR 72 | Temp 97.9°F | Ht 67.0 in | Wt 156.4 lb

## 2022-05-20 DIAGNOSIS — R601 Generalized edema: Secondary | ICD-10-CM

## 2022-05-20 DIAGNOSIS — M7989 Other specified soft tissue disorders: Secondary | ICD-10-CM

## 2022-05-20 MED ORDER — METOLAZONE 5 MG PO TABS: 5.0000 mg | ORAL_TABLET | Freq: Every day | ORAL | 0 refills | Status: DC

## 2022-05-20 NOTE — Progress Notes (Signed)
Subjective: Scrotum is tense and swollen with fluid    Patient ID: Randy Thomas, male    DOB: [birth year], 86 y.o.   MRN: [id number]  Randy Thomas is here today for a follow up for third spacing edema. He was diuresed with Lasix '40mg'$  BID and KDur 58mq daily.He feels his edema is .  Dr. PDennard Schaumann9/09/2021: Patient is a very pleasant 86year old Caucasian gentleman who presents today with scrotal swelling.  He has +3 pitting edema from his feet all the way up to his waist.  He has ascites in his abdomen.  His scrotum is tense and swollen with fluid.  He has a very complicated past medical history.  Patient underwent a cholecystectomy in the past due to gallstone pancreatitis.  This was complicated by damage to the cystic duct.  [facility].  This is subsequently developed scar tissue and stenosis.  [facility] patient had a stent put in the cystic duct.  This was complicated by postoperative hemorrhage.  He had to have this surgically corrected.  Needless to say, the patient has been in the hospital several times over the last month receiving transfusions and IV fluid.  He presents today with significant third spacing edema.  About 4 days ago he was started on Lasix 20 mg a day but so far this has been unsuccessful.  At that time, my plan was:  Patient has significant anasarca.  The question is whether this is due to third spacing edema from IV fluids during his hospitalization, or due to cirrhosis due to chronic liver damage, or due to congestive heart failure.  Uncertain at this time.  Increase Lasix to 40 mg twice daily and add Kdur 20 meq daily.  Recheck the first of next week.  If he is not experiencing diuresis with this, we may need to switch to Bumex as he may have a difficult time absorbing Lasix due to edema in the gastrointestinal wall.  Consider evaluation for cirrhosis because if present he may benefit  from spironolactone.  05/20/22 Patient is here today for follow-up.  He denies any chest pain or shortness of breath.  He continues to demonstrate significant anasarca.  His abdomen is distended and tense with fluid.  He has +2+3 edema in both legs.  The scrotum is tense and swollen with edema.  Lab work showed no evidence of kidney failure.  He has no documented history of cirrhosis.  He has not been evaluated for congestive heart failure.  He did not respond to Lasix.   Past Medical History:  Diagnosis Date   Allergy    GERD (gastroesophageal reflux disease)    Hard of hearing    Hypercholesteremia    Hyperlipidemia    Hypertension    Myocardial infarction ([place]    X 2 stents   Past Surgical History:  Procedure Laterality Date   CARDIAC CATHETERIZATION     X 2 stents   CHOLECYSTECTOMY N/A 05/20/2020   Procedure: LAPAROSCOPIC CHOLECYSTECTOMY;  Surgeon: BVirl Cagey MD;  Location: [facility];  Service: General;  Laterality: N/A;   ERCP N/A 05/18/2020   Procedure: ENDOSCOPIC RETROGRADE CHOLANGIOPANCREATOGRAPHY (ERCP) ;  Surgeon: RRogene Houston MD;  Location: [facility];  Service: Endoscopy;  Laterality: N/A;   ERCP N/A 05/20/2020   Procedure: ENDOSCOPIC RETROGRADE CHOLANGIOPANCREATOGRAPHY (ERCP);  Surgeon: BVirl Cagey MD;  Location: [facility];  Service: General;  Laterality: N/A;   FRACTURE SURGERY     1967 wore cast   SPHINCTEROTOMY  05/18/2020   Procedure: SPHINCTEROTOMY mild;  Surgeon: Rogene Houston, MD;  Location: [facility];  Service: Endoscopy;;   TONSILLECTOMY     as a child   Current Outpatient Medications on File Prior to Visit  Medication Sig Dispense Refill   furosemide (LASIX) 40 MG tablet Take 1 tablet (40 mg total) by mouth 2 (two) times daily. 60 tablet 3   HYDROcodone-acetaminophen (NORCO/VICODIN) 5-325 MG tablet Take 1 tablet by mouth every 6 (six) hours as needed for moderate pain.     traMADol (ULTRAM) 50 MG tablet Take 1 tablet (50 mg total) by mouth  every 8 (eight) hours as needed. 30 tablet 0   VASCEPA 1 g capsule Take 1 capsule by mouth in the morning and at bedtime.     ipratropium-albuterol (DUONEB) 0.5-2.5 (3) MG/3ML SOLN Take 3 mLs by nebulization every 4 (four) hours as needed. (Patient not taking: Reported on 05/20/2022) 360 mL    lactated ringers infusion 100 cc/hr (Patient not taking: Reported on 05/20/2022) 250 mL 0   piperacillin-tazobactam (ZOSYN) 3.375 GM/50ML IVPB Inject 50 mLs (3.375 g total) into the vein every 8 (eight) hours. (Patient not taking: Reported on 05/20/2022) 50 mL    potassium chloride SA (KLOR-CON M) 20 MEQ tablet Take 1 tablet (20 mEq total) by mouth daily. (Patient not taking: Reported on 05/20/2022) 30 tablet 3   No current facility-administered medications on file prior to visit.   No Known Allergies  Social History   Socioeconomic History   Marital status: Married    Spouse name: Not on file   Number of children: Not on file   Years of education: Not on file   Highest education level: Not on file  Occupational History   Occupation: retired    Comment: truck driver  Tobacco Use   Smoking status: Former    Types: Cigarettes    Quit date: 09/15/1981    Years since quitting: 40.7   Smokeless tobacco: Never  Vaping Use   Vaping Use: Never used  Substance and Sexual Activity   Alcohol use: No    Alcohol/week: 0.0 standard drinks of alcohol   Drug use: No   Sexual activity: Not Currently  Other Topics Concern   Not on file  Social History Narrative   Not on file   Social Determinants of Health   Financial Resource Strain: Not on file  Food Insecurity: Not on file  Transportation Needs: Not on file  Physical Activity: Not on file  Stress: Not on file  Social Connections: Not on file  Intimate Partner Violence: Not on file    Review of Systems  Constitutional:  Positive for fever.  All other systems reviewed and are negative.      Objective:   Physical Exam Vitals reviewed.   Constitutional:      Appearance: Normal appearance. He is normal weight. He is not ill-appearing or diaphoretic.  HENT:     Mouth/Throat:     Mouth: Mucous membranes are moist.  Cardiovascular:     Rate and Rhythm: Normal rate and regular rhythm.     Heart sounds: Normal heart sounds. No murmur heard.    No friction rub. No gallop.  Pulmonary:     Effort: Pulmonary effort is normal. No respiratory distress.     Breath sounds: Normal breath sounds. No stridor. No wheezing, rhonchi or rales.  Abdominal:  General: Abdomen is flat and protuberant. Bowel sounds are normal. There is no distension.     Palpations: Abdomen is soft. There is fluid wave.     Tenderness: There is no abdominal tenderness. There is no guarding or rebound.  Genitourinary:   Musculoskeletal:     Right lower leg: Edema present.     Left lower leg: Edema present.  Neurological:     Mental Status: He is alert.           Assessment & Plan:  Leg swelling  Anasarca I believe the patient is likely dealing with anasarca secondary to third spacing from aggressive IV fluids while he was hospitalized.  However I feel that we need to get an echocardiogram to evaluate for congestive heart failure.  Meanwhile I will try to "from the pump" using Zaroxolyn 5 mg 30 minutes prior to his morning dose of Lasix.  He will take Lasix 40 mg twice daily as he has been doing since Thursday and then recheck on Friday.  If the patient experiences aggressive diuresis with this technique we will back off on his diuretics.  If not I will switch to Bumex from Lasix suspecting decreased absorption due to bowel wall edema

## 2022-05-21 NOTE — Telephone Encounter (Signed)
Requested medications are due for refill today.  NO  Requested medications are on the active medications list.  yes  Last refill. 05/20/2022 #30 0 refills  Future visit scheduled.   yes  Notes to clinic.  Pt requesting 90 day supply.    Requested Prescriptions  Pending Prescriptions Disp Refills   metolazone (ZAROXOLYN) 5 MG tablet [Pharmacy Med Name: METOLAZONE '5MG'$  TABLETS] 90 tablet     Sig: TAKE 1 TABLET(5 MG) BY MOUTH DAILY     Cardiovascular:  Diuretics - Thiazide - metolazone Failed - 05/20/2022  3:14 PM      Failed - Cr in normal range and within 180 days    Creat  Date Value Ref Range Status  05/16/2022 0.51 (L) 0.70 - 1.22 mg/dL Final         Passed - Na in normal range and within 180 days    Sodium  Date Value Ref Range Status  05/16/2022 136 135 - 146 mmol/L Final         Passed - K in normal range and within 180 days    Potassium  Date Value Ref Range Status  05/16/2022 3.9 3.5 - 5.3 mmol/L Final         Passed - Last BP in normal range    BP Readings from Last 1 Encounters:  05/20/22 (!) 110/52         Passed - Valid encounter within last 6 months    Recent Outpatient Visits           5 months ago E coli bacteremia   [place] Susy Frizzle, MD   6 months ago FUO (fever of unknown origin)   [place] Pickard, Cammie Mcgee, MD   8 months ago General medical exam   [place] Susy Frizzle, MD   1 year ago Coronary artery disease involving native coronary artery of native heart without angina pectoris   [place] Pickard, Cammie Mcgee, MD   2 years ago Hordeolum externum, unspecified laterality   [place], [place], FNP       Future Appointments             In 2 days Pickard, Cammie Mcgee, MD [place]

## 2022-05-23 ENCOUNTER — Ambulatory Visit (INDEPENDENT_AMBULATORY_CARE_PROVIDER_SITE_OTHER): Payer: Medicare HMO | Admitting: Family Medicine

## 2022-05-23 VITALS — BP 110/62 | HR 72 | Ht 67.0 in | Wt 149.0 lb

## 2022-05-23 DIAGNOSIS — R601 Generalized edema: Secondary | ICD-10-CM

## 2022-05-23 DIAGNOSIS — M7989 Other specified soft tissue disorders: Secondary | ICD-10-CM | POA: Diagnosis not present

## 2022-05-23 MED ORDER — HYDROCODONE-ACETAMINOPHEN 5-325 MG PO TABS: 1.0000 | ORAL_TABLET | Freq: Four times a day (QID) | ORAL | 0 refills | Status: DC | PRN

## 2022-05-23 NOTE — Progress Notes (Signed)
Subjective: Scrotum is tense and swollen with fluid    Patient ID: Randy Thomas, male    DOB: December 13, 1935, 86 y.o.   MRN: [id number] At the patient's last visit, I recommended taking Zaroxolyn milligrams in the morning followed by Lasix 40 mg 30 minutes after and and taking an additional dose of Lasix 40 mg in the afternoon.  The patient has been doing that over the last 2 to 3 days due to significant anasarca.  At his last visit he had 3+ edema in both legs with tense scrotal swelling along with bibasilar crackles. . Patient was demonstrating anasarca.  I was concerned that he may not be absorbing furosemide due to edema in the bowel wall.  He is here today for follow-up. Wt Readings from Last 3 Encounters:  05/23/22 149 lb (67.6 kg)  05/20/22 156 lb 6.4 oz (70.9 kg)  05/16/22 158 lb (71.7 kg)   Patient has lost 9 pounds in the last week.  Does seem to be responding to his diuretic.  He has lost 7 pounds since we added Zaroxolyn.  (He has taken 3 doses).  The swelling has improved dramatically and scrotum.  The edema in the legs is improved but is still +2.  He has right basilar crackles now but the left side is clear Past Medical History:  Diagnosis Date   Allergy    GERD (gastroesophageal reflux disease)    Hard of hearing    Hypercholesteremia    Hyperlipidemia    Hypertension    Myocardial infarction ([place])    X 2 stents   Past Surgical History:  Procedure Laterality Date   CARDIAC CATHETERIZATION     X 2 stents   CHOLECYSTECTOMY N/A 05/20/2020   Procedure: LAPAROSCOPIC CHOLECYSTECTOMY;  Surgeon: [name], MD;  Location: [facility];  Service: General;  Laterality: N/A;   ERCP N/A 05/18/2020   Procedure: ENDOSCOPIC RETROGRADE CHOLANGIOPANCREATOGRAPHY (ERCP) ;  Surgeon: Rogene Houston, MD;  [facility];  Service: Endoscopy;  Laterality: N/A;   ERCP N/A 05/20/2020   Procedure: ENDOSCOPIC RETROGRADE CHOLANGIOPANCREATOGRAPHY (ERCP);  Surgeon: [name], MD;   Location: [facility];  Service: General;  Laterality: N/A;   [place] wore cast   SPHINCTEROTOMY  05/18/2020   Procedure: SPHINCTEROTOMY mild;  Surgeon: Rogene Houston, MD;  [facility];  Service: Endoscopy;;   TONSILLECTOMY     as a child   Current Outpatient Medications on File Prior to Visit  Medication Sig Dispense Refill   VASCEPA 1 g capsule Take 1 capsule by mouth in the morning and at bedtime.     furosemide (LASIX) 40 MG tablet Take 1 tablet (40 mg total) by mouth 2 (two) times daily. 60 tablet 3   HYDROcodone-acetaminophen (NORCO/VICODIN) 5-325 MG tablet Take 1 tablet by mouth every 6 (six) hours as needed for moderate pain.     ipratropium-albuterol (DUONEB) 0.5-2.5 (3) MG/3ML SOLN Take 3 mLs by nebulization every 4 (four) hours as needed. (Patient not taking: Reported on 05/20/2022) 360 mL    lactated ringers infusion 100 cc/hr (Patient not taking: Reported on 05/20/2022) 250 mL 0   metolazone (ZAROXOLYN) 5 MG tablet Take 1 tablet (5 mg total) by mouth daily. 30 tablet 0   piperacillin-tazobactam (ZOSYN) 3.375 GM/50ML IVPB Inject 50 mLs (3.375 g total) into the vein every 8 (eight) hours. (Patient not taking: Reported on 05/20/2022) 50 mL    potassium chloride SA (KLOR-CON M)  20 MEQ tablet Take 1 tablet (20 mEq total) by mouth daily. (Patient not taking: Reported on 05/20/2022) 30 tablet 3   traMADol (ULTRAM) 50 MG tablet Take 1 tablet (50 mg total) by mouth every 8 (eight) hours as needed. 30 tablet 0   No current facility-administered medications on file prior to visit.   No Known Allergies  Social History   Socioeconomic History   Marital status: Married    Spouse name: Not on file   Number of children: Not on file   Years of education: Not on file   Highest education level: Not on file  Occupational History   Occupation: retired    Comment: truck driver  Tobacco Use   Smoking status: Former    Types: Cigarettes    Quit date: 09/15/1981    Years  since quitting: 40.7   Smokeless tobacco: Never  Vaping Use   Vaping Use: Never used  Substance and Sexual Activity   Alcohol use: No    Alcohol/week: 0.0 standard drinks of alcohol   Drug use: No   Sexual activity: Not Currently  Other Topics Concern   Not on file  Social History Narrative   Not on file   Social Determinants of Health   Financial Resource Strain: Not on file  Food Insecurity: Not on file  Transportation Needs: Not on file  Physical Activity: Not on file  Stress: Not on file  Social Connections: Not on file  Intimate Partner Violence: Not on file    Review of Systems  All other systems reviewed and are negative.      Objective:   Physical Exam Vitals reviewed.  Constitutional:      Appearance: Normal appearance. He is normal weight. He is not ill-appearing or diaphoretic.  HENT:     Mouth/Throat:     Mouth: Mucous membranes are moist.  Cardiovascular:     Rate and Rhythm: Normal rate and regular rhythm.     Heart sounds: Normal heart sounds. No murmur heard.    No friction rub. No gallop.  Pulmonary:     Effort: Pulmonary effort is normal. No respiratory distress.     Breath sounds: Normal breath sounds. No stridor. No wheezing, rhonchi or rales.  Abdominal:     General: Abdomen is flat and protuberant. Bowel sounds are normal. There is no distension.     Palpations: Abdomen is soft. There is fluid wave.     Tenderness: There is no abdominal tenderness. There is no guarding or rebound.  Genitourinary:   Musculoskeletal:     Right lower leg: Edema present.     Left lower leg: Edema present.  Neurological:     Mental Status: He is alert.           Assessment & Plan:  Anasarca - Plan: BASIC METABOLIC PANEL WITH GFR  Leg swelling Anasarca has improved.  Continue Zaroxolyn with Lasix and potassium at his current dose for another 3 days.  He has lost 7 pounds.  However I feel that he needs to lose about another 7 pounds of fluid based on  the swelling am seeing today.  By Monday I think that he can discontinue the Lasix, the potassium, and the Zaroxolyn.  We will only resume fluid pills if the swelling recurs.  Obtain baseline electrolytes today.  Family will update me on Monday as to how the situation is progressing.  Discontinue the fluid pill sooner if he loses more than 7 pounds before Monday

## 2022-05-24 LAB — BASIC METABOLIC PANEL WITH GFR
BUN: 12 mg/dL (ref 7–25)
CO2: 29 mmol/L (ref 20–32)
Calcium: 8 mg/dL — ABNORMAL LOW (ref 8.6–10.3)
Chloride: 93 mmol/L — ABNORMAL LOW (ref 98–110)
Creat: 1.06 mg/dL (ref 0.70–1.22)
Glucose, Bld: 110 mg/dL — ABNORMAL HIGH (ref 65–99)
Potassium: 2.6 mmol/L — CL (ref 3.5–5.3)
Sodium: 137 mmol/L (ref 135–146)
eGFR: 69 mL/min/{1.73_m2} (ref >=60)

## 2022-05-26 DIAGNOSIS — S3613XD Injury of bile duct, subsequent encounter: Secondary | ICD-10-CM | POA: Diagnosis not present

## 2022-05-26 DIAGNOSIS — I10 Essential (primary) hypertension: Secondary | ICD-10-CM | POA: Diagnosis not present

## 2022-05-26 DIAGNOSIS — R188 Other ascites: Secondary | ICD-10-CM | POA: Diagnosis not present

## 2022-05-26 DIAGNOSIS — B961 Klebsiella pneumoniae [K. pneumoniae] as the cause of diseases classified elsewhere: Secondary | ICD-10-CM | POA: Diagnosis not present

## 2022-05-26 DIAGNOSIS — D62 Acute posthemorrhagic anemia: Secondary | ICD-10-CM | POA: Diagnosis not present

## 2022-05-26 DIAGNOSIS — K831 Obstruction of bile duct: Secondary | ICD-10-CM | POA: Diagnosis not present

## 2022-05-26 DIAGNOSIS — I252 Old myocardial infarction: Secondary | ICD-10-CM | POA: Diagnosis not present

## 2022-05-26 DIAGNOSIS — E785 Hyperlipidemia, unspecified: Secondary | ICD-10-CM | POA: Diagnosis not present

## 2022-05-26 DIAGNOSIS — A4159 Other Gram-negative sepsis: Secondary | ICD-10-CM | POA: Diagnosis not present

## 2022-05-26 DIAGNOSIS — E43 Unspecified severe protein-calorie malnutrition: Secondary | ICD-10-CM | POA: Diagnosis not present

## 2022-05-26 DIAGNOSIS — K219 Gastro-esophageal reflux disease without esophagitis: Secondary | ICD-10-CM | POA: Diagnosis not present

## 2022-05-26 DIAGNOSIS — I2510 Atherosclerotic heart disease of native coronary artery without angina pectoris: Secondary | ICD-10-CM | POA: Diagnosis not present

## 2022-05-29 ENCOUNTER — Telehealth: Payer: Self-pay | Admitting: Family Medicine

## 2022-05-29 DIAGNOSIS — E8809 Other disorders of plasma-protein metabolism, not elsewhere classified: Secondary | ICD-10-CM | POA: Diagnosis not present

## 2022-05-29 DIAGNOSIS — R188 Other ascites: Secondary | ICD-10-CM | POA: Diagnosis not present

## 2022-05-29 DIAGNOSIS — E873 Alkalosis: Secondary | ICD-10-CM | POA: Diagnosis not present

## 2022-05-29 DIAGNOSIS — I10 Essential (primary) hypertension: Secondary | ICD-10-CM | POA: Diagnosis not present

## 2022-05-29 DIAGNOSIS — R601 Generalized edema: Secondary | ICD-10-CM | POA: Diagnosis not present

## 2022-05-29 DIAGNOSIS — E876 Hypokalemia: Secondary | ICD-10-CM | POA: Diagnosis not present

## 2022-05-29 DIAGNOSIS — I2510 Atherosclerotic heart disease of native coronary artery without angina pectoris: Secondary | ICD-10-CM | POA: Diagnosis not present

## 2022-05-29 DIAGNOSIS — K8309 Other cholangitis: Secondary | ICD-10-CM | POA: Diagnosis not present

## 2022-05-29 DIAGNOSIS — K219 Gastro-esophageal reflux disease without esophagitis: Secondary | ICD-10-CM | POA: Diagnosis not present

## 2022-05-29 DIAGNOSIS — E46 Unspecified protein-calorie malnutrition: Secondary | ICD-10-CM | POA: Diagnosis not present

## 2022-05-29 DIAGNOSIS — N179 Acute kidney failure, unspecified: Secondary | ICD-10-CM | POA: Diagnosis not present

## 2022-05-29 DIAGNOSIS — S3613XD Injury of bile duct, subsequent encounter: Secondary | ICD-10-CM | POA: Diagnosis not present

## 2022-05-29 DIAGNOSIS — E869 Volume depletion, unspecified: Secondary | ICD-10-CM | POA: Diagnosis not present

## 2022-05-29 DIAGNOSIS — Z4682 Encounter for fitting and adjustment of non-vascular catheter: Secondary | ICD-10-CM | POA: Diagnosis not present

## 2022-05-29 NOTE — Telephone Encounter (Signed)
Received call from patient's spouse Benjamine Mola to cancel lab appt scheduled for tomorrow; stated his labs are being drawn today at [facility].

## 2022-05-30 ENCOUNTER — Other Ambulatory Visit: Payer: Medicare HMO

## 2022-05-30 DIAGNOSIS — K8309 Other cholangitis: Secondary | ICD-10-CM | POA: Diagnosis not present

## 2022-05-30 DIAGNOSIS — E876 Hypokalemia: Secondary | ICD-10-CM | POA: Diagnosis not present

## 2022-05-30 DIAGNOSIS — I10 Essential (primary) hypertension: Secondary | ICD-10-CM | POA: Diagnosis not present

## 2022-05-30 DIAGNOSIS — N179 Acute kidney failure, unspecified: Secondary | ICD-10-CM | POA: Diagnosis not present

## 2022-05-30 DIAGNOSIS — E8809 Other disorders of plasma-protein metabolism, not elsewhere classified: Secondary | ICD-10-CM | POA: Diagnosis not present

## 2022-05-30 DIAGNOSIS — E873 Alkalosis: Secondary | ICD-10-CM | POA: Diagnosis not present

## 2022-05-30 DIAGNOSIS — E46 Unspecified protein-calorie malnutrition: Secondary | ICD-10-CM | POA: Diagnosis not present

## 2022-05-30 DIAGNOSIS — R188 Other ascites: Secondary | ICD-10-CM | POA: Diagnosis not present

## 2022-06-02 ENCOUNTER — Telehealth: Payer: Self-pay | Admitting: *Deleted

## 2022-06-02 ENCOUNTER — Encounter: Payer: Self-pay | Admitting: *Deleted

## 2022-06-02 NOTE — Patient Outreach (Signed)
  Care Coordination [facility] Note Transition Care Management Follow-up Telephone Call Date of discharge and from where: 06/01/22 [facility] How have you been since you were released from the hospital? Per wife, "doing much better. He seems more alert" Any questions or concerns? No  Items Reviewed: Did the pt receive and understand the discharge instructions provided? Yes  Medications obtained and verified? Yes  Other? No  Any new allergies since your discharge? No  Dietary orders reviewed? Yes. High protein, advance as tolerated. Eating well per wife. Do you have support at home? Yes   Home Care and Equipment/Supplies: Were home health services ordered? No. Not at discharge but has existing home health PT If so, what is the name of the agency? unsure  Has the agency set up a time to come to the patient's home? yes Were any new equipment or medical supplies ordered?  No What is the name of the medical supply agency? N/a Were you able to get the supplies/equipment? not applicable Do you have any questions related to the use of the equipment or supplies? No  Functional Questionnaire: (I = Independent and D = Dependent) ADLs: D  Bathing/Dressing- D  Meal Prep- D  Eating- I  Maintaining continence- I  Transferring/Ambulation- D. Uses walker and some assistance from wife  Managing Meds- D  Follow up appointments reviewed:  PCP Hospital f/u appt confirmed? No  Staff message sent to [place] to coordinate appointment. [facility] f/u appt confirmed? Yes  Scheduled to see surgeon Bailey Mech, MD on 06/07/22 @ 2:15. Are transportation arrangements needed? No  If their condition worsens, is the pt aware to call PCP or go to the Emergency Dept.? Yes Was the patient provided with contact information for the PCP's office or ED? Yes Was to pt encouraged to call back with questions or concerns? Yes  SDOH assessments and interventions completed:    Yes  Care Coordination Interventions Activated:  Yes   Care Coordination Interventions:  PCP follow up appointment requested   Encounter Outcome:  Pt. Visit Completed    Chong Sicilian, BSN, RN-BC RN Care Coordinator [place]: [phone] Main #: [phone]

## 2022-06-03 ENCOUNTER — Ambulatory Visit (HOSPITAL_COMMUNITY): Payer: Medicare HMO | Attending: Family Medicine

## 2022-06-03 ENCOUNTER — Telehealth: Payer: Self-pay

## 2022-06-03 DIAGNOSIS — R601 Generalized edema: Secondary | ICD-10-CM | POA: Diagnosis not present

## 2022-06-03 LAB — ECHOCARDIOGRAM COMPLETE
Area-P 1/2: 2.47 cm2
P 1/2 time: 496 msec
S' Lateral: 2.8 cm

## 2022-06-03 NOTE — Telephone Encounter (Signed)
Malorie an OT with [facility] called in to get a verbal order for a OT evaluation next week. Malorie stated that pt cancelled the previous appt for a [facility] evaluation. If pcp has any questions, please feel free to call at number provided.  CB: Malorie OT with [facility], [phone]

## 2022-06-04 NOTE — Telephone Encounter (Signed)
Malorie RN with [facility] called back in to check to see about the verbal orders. Please contact   Cb: [phone]

## 2022-06-11 ENCOUNTER — Telehealth: Payer: Self-pay | Admitting: Family Medicine

## 2022-06-11 NOTE — Telephone Encounter (Signed)
Received voicemail from [facility] OT at [facility]). Requesting call back from a nurse.  Please advise at [phone].

## 2022-06-12 DIAGNOSIS — R188 Other ascites: Secondary | ICD-10-CM | POA: Diagnosis not present

## 2022-06-12 DIAGNOSIS — R7881 Bacteremia: Secondary | ICD-10-CM | POA: Diagnosis not present

## 2022-06-12 DIAGNOSIS — S3613XD Injury of bile duct, subsequent encounter: Secondary | ICD-10-CM | POA: Diagnosis not present

## 2022-06-12 DIAGNOSIS — K8309 Other cholangitis: Secondary | ICD-10-CM | POA: Diagnosis not present

## 2022-06-12 DIAGNOSIS — K9189 Other postprocedural complications and disorders of digestive system: Secondary | ICD-10-CM | POA: Diagnosis not present

## 2022-06-12 DIAGNOSIS — B9620 Unspecified Escherichia coli [E. coli] as the cause of diseases classified elsewhere: Secondary | ICD-10-CM | POA: Diagnosis not present

## 2022-06-12 DIAGNOSIS — K831 Obstruction of bile duct: Secondary | ICD-10-CM | POA: Diagnosis not present

## 2022-06-12 DIAGNOSIS — E876 Hypokalemia: Secondary | ICD-10-CM | POA: Diagnosis not present

## 2022-06-12 DIAGNOSIS — E43 Unspecified severe protein-calorie malnutrition: Secondary | ICD-10-CM | POA: Diagnosis not present

## 2022-06-16 ENCOUNTER — Inpatient Hospital Stay: Payer: Medicare HMO | Admitting: Family Medicine

## 2022-06-16 ENCOUNTER — Other Ambulatory Visit: Payer: Self-pay | Admitting: Family Medicine

## 2022-06-20 ENCOUNTER — Ambulatory Visit (INDEPENDENT_AMBULATORY_CARE_PROVIDER_SITE_OTHER): Payer: Medicare HMO | Admitting: Family Medicine

## 2022-06-20 VITALS — BP 110/76 | HR 88 | Temp 98.1°F | Ht 67.0 in | Wt 154.8 lb

## 2022-06-20 DIAGNOSIS — R601 Generalized edema: Secondary | ICD-10-CM

## 2022-06-20 MED ORDER — HYDROCODONE-ACETAMINOPHEN 5-325 MG PO TABS: 1.0000 | ORAL_TABLET | Freq: Four times a day (QID) | ORAL | 0 refills | Status: AC | PRN

## 2022-06-20 NOTE — Progress Notes (Signed)
Subjective: Scrotum is tense and swollen with fluid   Patient ID: Randy Thomas, male    DOB: October 11, 1935, 86 y.o.   MRN: [id number] 05/23/22 At the patient's last visit, I recommended taking Zaroxolyn milligrams in the morning followed by Lasix 40 mg 30 minutes after and and taking an additional dose of Lasix 40 mg in the afternoon.  The patient has been doing that over the last 2 to 3 days due to significant anasarca.  At his last visit he had 3+ edema in both legs with tense scrotal swelling along with bibasilar crackles. . Patient was demonstrating anasarca.  I was concerned that he may not be absorbing furosemide due to edema in the bowel wall.  He is here today for follow-up. Wt Readings from Last 3 Encounters:  05/23/22 149 lb (67.6 kg)  05/20/22 156 lb 6.4 oz (70.9 kg)  05/16/22 158 lb (71.7 kg)   Patient has lost 9 pounds in the last week.  Does seem to be responding to his diuretic.  He has lost 7 pounds since we added Zaroxolyn.  (He has taken 3 doses).  The swelling has improved dramatically and scrotum.  The edema in the legs is improved but is still +2.  He has right basilar crackles now but the left side is clear. AT that time, my plan was:  Anasarca has improved.  Continue Zaroxolyn with Lasix and potassium at his current dose for another 3 days.  He has lost 7 pounds.  However I feel that he needs to lose about another 7 pounds of fluid based on the swelling am seeing today.  By Monday I think that he can discontinue the Lasix, the potassium, and the Zaroxolyn.  We will only resume fluid pills if the swelling recurs.  Obtain baseline electrolytes today.  Family will update me on Monday as to how the situation is progressing.  Discontinue the fluid pill sooner if he loses more than 7 pounds before Monday  06/20/22 I have not seen the patient since, and asked them to come in today so I could monitor his anasarca, diuretic dose and electrolytes.  Patient's weight is up several pounds since  I last saw him.  He has ascites in his abdomen although he denies any chest pain or shortness of breath.  He is seeing [facility].  Apparently he was admitted due to hypokalemia.  They stopped his diuretics.  He was then admitted for hyperkalemia.  He is not on spironolactone.  I believe he has anasarca and ascites secondary to liver dysfunction stemming from his complicated past medical history.  I believe he would be an excellent candidate for spironolactone.  My concern is that with ascites he will likely develop shortness of breath due to restrictive lung disease from the ascites pressing against his diaphragm Past Medical History:  Diagnosis Date   Allergy    GERD (gastroesophageal reflux disease)    Hard of hearing    Hypercholesteremia    Hyperlipidemia    Hypertension    Myocardial infarction ([place])    X 2 stents   Past Surgical History:  Procedure Laterality Date   CARDIAC CATHETERIZATION     X 2 stents   CHOLECYSTECTOMY N/A 05/20/2020   Procedure: LAPAROSCOPIC CHOLECYSTECTOMY;  Surgeon: [name], MD;  Location: [facility];  Service: General;  Laterality: N/A;   ERCP N/A 05/18/2020   Procedure: ENDOSCOPIC RETROGRADE CHOLANGIOPANCREATOGRAPHY (ERCP) ;  Surgeon: Rogene Houston, MD;  Location: [facility];  Service: Endoscopy;  Laterality: N/A;   ERCP N/A 05/20/2020   Procedure: ENDOSCOPIC RETROGRADE CHOLANGIOPANCREATOGRAPHY (ERCP);  Surgeon: [name], MD;  Location: [facility];  Service: General;  Laterality: N/A;   [place] wore cast   SPHINCTEROTOMY  05/18/2020   Procedure: SPHINCTEROTOMY mild;  Surgeon: Rogene Houston, MD;  Location: [facility];  Service: Endoscopy;;   TONSILLECTOMY     as a child   Current Outpatient Medications on File Prior to Visit  Medication Sig Dispense Refill   VASCEPA 1 g capsule Take 1 capsule by mouth in the morning and at bedtime.     furosemide (LASIX) 40 MG tablet Take 1 tablet (40 mg total) by mouth 2 (two) times  daily. 60 tablet 3   HYDROcodone-acetaminophen (NORCO/VICODIN) 5-325 MG tablet Take 1 tablet by mouth every 6 (six) hours as needed for moderate pain. 30 tablet 0   ipratropium-albuterol (DUONEB) 0.5-2.5 (3) MG/3ML SOLN Take 3 mLs by nebulization every 4 (four) hours as needed. (Patient not taking: Reported on 05/20/2022) 360 mL    lactated ringers infusion 100 cc/hr (Patient not taking: Reported on 05/20/2022) 250 mL 0   metolazone (ZAROXOLYN) 5 MG tablet TAKE 1 TABLET(5 MG) BY MOUTH DAILY 90 tablet 0   piperacillin-tazobactam (ZOSYN) 3.375 GM/50ML IVPB Inject 50 mLs (3.375 g total) into the vein every 8 (eight) hours. (Patient not taking: Reported on 05/20/2022) 50 mL    potassium chloride SA (KLOR-CON M) 20 MEQ tablet Take 1 tablet (20 mEq total) by mouth daily. (Patient not taking: Reported on 05/20/2022) 30 tablet 3   No current facility-administered medications on file prior to visit.   No Known Allergies  Social History   Socioeconomic History   Marital status: Married    Spouse name: Not on file   Number of children: Not on file   Years of education: Not on file   Highest education level: Not on file  Occupational History   Occupation: retired    Comment: truck driver  Tobacco Use   Smoking status: Former    Types: Cigarettes    Quit date: 09/15/1981    Years since quitting: 40.7   Smokeless tobacco: Never  Vaping Use   Vaping Use: Never used  Substance and Sexual Activity   Alcohol use: No    Alcohol/week: 0.0 standard drinks of alcohol   Drug use: No   Sexual activity: Not Currently  Other Topics Concern   Not on file  Social History Narrative   Not on file   Social Determinants of Health   Financial Resource Strain: Low Risk  (06/02/2022)   Overall Financial Resource Strain (CARDIA)    Difficulty of Paying Living Expenses: Not hard at all  Food Insecurity: Not on file  Transportation Needs: No Transportation Needs (06/02/2022)   PRAPARE - Radiographer, therapeutic (Medical): No    Lack of Transportation (Non-Medical): No  Physical Activity: Not on file  Stress: Not on file  Social Connections: Not on file  Intimate Partner Violence: Not on file    Review of Systems  All other systems reviewed and are negative.      Objective:   Physical Exam Vitals reviewed.  Constitutional:      Appearance: Normal appearance. He is normal weight. He is not ill-appearing or diaphoretic.  HENT:     Mouth/Throat:     Mouth: Mucous membranes are moist.  Cardiovascular:     Rate  and Rhythm: Normal rate and regular rhythm.     Heart sounds: Normal heart sounds. No murmur heard.    No friction rub. No gallop.  Pulmonary:     Effort: Pulmonary effort is normal. No respiratory distress.     Breath sounds: Normal breath sounds. No stridor. No wheezing, rhonchi or rales.  Abdominal:     General: Abdomen is protuberant. Bowel sounds are normal. There is distension.     Palpations: There is fluid wave.     Tenderness: There is no abdominal tenderness. There is no guarding or rebound.  Genitourinary:   Musculoskeletal:     Right lower leg: Edema present.     Left lower leg: Edema present.  Neurological:     Mental Status: He is alert.           Assessment & Plan:  Anasarca - Plan: Brain natriuretic peptide, BASIC METABOLIC PANEL WITH GFR Patient has ascites from liver dysfunction.  I am concerned that he will likely be admitted to the hospital with shortness of breath due to restrictive lung disease from ascites pressing against his diaphragm.  Therefore I believe if we get him started on a diuretic, we can help prevent that in the future.  Due to his liver dysfunction, he would benefit from spironolactone:lasix in a 100:40 ratio.  He would also benefit from sodium restriction.  My plan is to check a BMP to evaluate his potassium today and then plan on starting spironolactone at 25 mg twice daily with Lasix 20 mg daily if he can tolerate  this

## 2022-06-21 LAB — BASIC METABOLIC PANEL WITH GFR
BUN: 18 mg/dL (ref 7–25)
CO2: 21 mmol/L (ref 20–32)
Calcium: 8.3 mg/dL — ABNORMAL LOW (ref 8.6–10.3)
Chloride: 107 mmol/L (ref 98–110)
Creat: 0.71 mg/dL (ref 0.70–1.22)
Glucose, Bld: 109 mg/dL — ABNORMAL HIGH (ref 65–99)
Potassium: 4.5 mmol/L (ref 3.5–5.3)
Sodium: 139 mmol/L (ref 135–146)
eGFR: 90 mL/min/{1.73_m2} (ref >=60)

## 2022-06-21 LAB — BRAIN NATRIURETIC PEPTIDE: Brain Natriuretic Peptide: 112 pg/mL — ABNORMAL HIGH (ref <100)

## 2022-06-23 ENCOUNTER — Other Ambulatory Visit: Payer: Self-pay | Admitting: Family Medicine

## 2022-06-23 MED ORDER — FUROSEMIDE 20 MG PO TABS: 20.0000 mg | ORAL_TABLET | Freq: Every day | ORAL | 3 refills | Status: AC

## 2022-06-23 MED ORDER — SPIRONOLACTONE 25 MG PO TABS: 25.0000 mg | ORAL_TABLET | Freq: Two times a day (BID) | ORAL | 3 refills | Status: AC

## 2022-06-26 DIAGNOSIS — R1084 Generalized abdominal pain: Secondary | ICD-10-CM | POA: Diagnosis not present

## 2022-06-26 DIAGNOSIS — R188 Other ascites: Secondary | ICD-10-CM | POA: Insufficient documentation

## 2022-06-27 DIAGNOSIS — N179 Acute kidney failure, unspecified: Secondary | ICD-10-CM | POA: Diagnosis not present

## 2022-06-27 DIAGNOSIS — Z9889 Other specified postprocedural states: Secondary | ICD-10-CM | POA: Diagnosis not present

## 2022-06-27 DIAGNOSIS — R188 Other ascites: Secondary | ICD-10-CM | POA: Diagnosis not present

## 2022-06-27 DIAGNOSIS — Z9049 Acquired absence of other specified parts of digestive tract: Secondary | ICD-10-CM | POA: Diagnosis not present

## 2022-06-27 DIAGNOSIS — Z8719 Personal history of other diseases of the digestive system: Secondary | ICD-10-CM | POA: Diagnosis not present

## 2022-06-27 DIAGNOSIS — K652 Spontaneous bacterial peritonitis: Secondary | ICD-10-CM | POA: Diagnosis not present

## 2022-06-27 DIAGNOSIS — Z934 Other artificial openings of gastrointestinal tract status: Secondary | ICD-10-CM | POA: Diagnosis not present

## 2022-06-27 DIAGNOSIS — K659 Peritonitis, unspecified: Secondary | ICD-10-CM | POA: Insufficient documentation

## 2022-06-27 DIAGNOSIS — I10 Essential (primary) hypertension: Secondary | ICD-10-CM | POA: Diagnosis not present

## 2022-06-28 DIAGNOSIS — R188 Other ascites: Secondary | ICD-10-CM | POA: Diagnosis not present

## 2022-06-28 DIAGNOSIS — Z934 Other artificial openings of gastrointestinal tract status: Secondary | ICD-10-CM | POA: Diagnosis not present

## 2022-06-28 DIAGNOSIS — E876 Hypokalemia: Secondary | ICD-10-CM | POA: Diagnosis not present

## 2022-06-28 DIAGNOSIS — N179 Acute kidney failure, unspecified: Secondary | ICD-10-CM | POA: Diagnosis not present

## 2022-06-28 DIAGNOSIS — K652 Spontaneous bacterial peritonitis: Secondary | ICD-10-CM | POA: Diagnosis not present

## 2022-06-28 DIAGNOSIS — I10 Essential (primary) hypertension: Secondary | ICD-10-CM | POA: Diagnosis not present

## 2022-06-29 DIAGNOSIS — I10 Essential (primary) hypertension: Secondary | ICD-10-CM | POA: Diagnosis not present

## 2022-06-29 DIAGNOSIS — Z934 Other artificial openings of gastrointestinal tract status: Secondary | ICD-10-CM | POA: Diagnosis not present

## 2022-06-29 DIAGNOSIS — K652 Spontaneous bacterial peritonitis: Secondary | ICD-10-CM | POA: Diagnosis not present

## 2022-06-29 DIAGNOSIS — R188 Other ascites: Secondary | ICD-10-CM | POA: Diagnosis not present

## 2022-06-29 DIAGNOSIS — N179 Acute kidney failure, unspecified: Secondary | ICD-10-CM | POA: Diagnosis not present

## 2022-06-29 DIAGNOSIS — E876 Hypokalemia: Secondary | ICD-10-CM | POA: Diagnosis not present

## 2022-06-30 ENCOUNTER — Ambulatory Visit: Payer: Medicare HMO | Admitting: Family Medicine

## 2022-06-30 ENCOUNTER — Other Ambulatory Visit: Payer: Medicare HMO

## 2022-06-30 DIAGNOSIS — R188 Other ascites: Secondary | ICD-10-CM | POA: Diagnosis not present

## 2022-06-30 DIAGNOSIS — K652 Spontaneous bacterial peritonitis: Secondary | ICD-10-CM | POA: Diagnosis not present

## 2022-06-30 DIAGNOSIS — E876 Hypokalemia: Secondary | ICD-10-CM | POA: Diagnosis not present

## 2022-06-30 DIAGNOSIS — E872 Acidosis, unspecified: Secondary | ICD-10-CM | POA: Diagnosis not present

## 2022-07-01 DIAGNOSIS — Z9049 Acquired absence of other specified parts of digestive tract: Secondary | ICD-10-CM | POA: Diagnosis not present

## 2022-07-01 DIAGNOSIS — Z8719 Personal history of other diseases of the digestive system: Secondary | ICD-10-CM | POA: Diagnosis not present

## 2022-07-01 DIAGNOSIS — Z9889 Other specified postprocedural states: Secondary | ICD-10-CM | POA: Diagnosis not present

## 2022-07-01 DIAGNOSIS — R188 Other ascites: Secondary | ICD-10-CM | POA: Diagnosis not present

## 2022-07-02 DIAGNOSIS — R188 Other ascites: Secondary | ICD-10-CM | POA: Diagnosis not present

## 2022-07-03 DIAGNOSIS — R188 Other ascites: Secondary | ICD-10-CM | POA: Diagnosis not present

## 2022-07-04 ENCOUNTER — Telehealth: Payer: Self-pay

## 2022-07-04 NOTE — Telephone Encounter (Signed)
FYI:  Call from Kizzie Ide, MD from [facility] updating you on Randy Thomas.  Pt is being sent home today. Pt had Paracentesis x 2. Electrolytes are back in balance. Recommended CMP and Magnesium in 1.5 weeks. Has F/U there in 2 weeks. Taking Spironolactone 100 mg per day an Lasix 40 mg  in split dose. They did start pt on Megase to help with appetite.

## 2022-07-07 ENCOUNTER — Telehealth: Payer: Self-pay | Admitting: *Deleted

## 2022-07-07 ENCOUNTER — Encounter: Payer: Self-pay | Admitting: *Deleted

## 2022-07-07 ENCOUNTER — Telehealth: Payer: Self-pay

## 2022-07-07 NOTE — Telephone Encounter (Signed)
Spoke with pt's daughter, Orbie Hurst and advise that Mr. Maden needs to be seen. Orbie Hurst states she is unsure she can get her Dad down the steps and into the car. Do they need to try an get him to go back to the hospital?

## 2022-07-07 NOTE — Telephone Encounter (Signed)
Dr Dennard Schaumann,   I spoke with Astrid Divine during a TOC call to follow-up on Chavez's recent discharge on 07/04/22. He's weaker now than when he was discharged and he hasn't urinated any today. He has taken lasix and aldactone. Last dose of lasix was at 1:30. He continues to have abdominal distention but it hasn't gotten any worse. He doesn't want to be hospitalized again. I wanted to get your opinion and see if you guys can reach out to them with advice on how best to manage his current situation. He doesn't have any home health services right now. I briefly discussed possibility of palliative care. I don't think they're ready for Hospice or if he has a qualifying medical diagnosis for Medicare to cover that. Wife is wanting him to be comfortable but also wants him to get better. I will be out of the office this afternoon and tomorrow but will be back on Wednesday. If you need any care coordination service help through [facility], please reach out to Jackelyn Poling, RN Care Coordinator [phone]. I will also copy her on this message.   Thank you,   Chong Sicilian, BSN, RN-BC  [place]: [phone]  Main #: [phone]

## 2022-07-09 NOTE — Patient Outreach (Signed)
  Care Coordination [facility] Note Transition Care Management Follow-up Telephone Call Date of discharge and from where: 07/04/22 from [facility] How have you been since you were released from the hospital? Per wife, "He's worse than he was when he came home. He's too weak to do physical therapy but can't walk more than a couple of feet on his own." Any questions or concerns? Yes. Wife concerned about weakness  Items Reviewed: Did the pt receive and understand the discharge instructions provided? Yes  Medications obtained and verified? Yes  Other? Yes . Reviewed and discussed weakness and ascites. Abd swelling wasn't any worse. Prior to admission he was able to walk down the hall on his own. Ow can't walk 3 steps. Too tired/weak for PT, per wife.  Any new allergies since your discharge? No  Dietary orders reviewed? Yes Do you have support at home? Yes . Wife provides most care. Adult son lives with them and daughter lives a couple of streets over.  Home Care and Equipment/Supplies: Were home health services ordered? yes If so, what is the name of the agency? Unsure  Has the agency set up a time to come to the patient's home? yes Were any new equipment or medical supplies ordered?  No What is the name of the medical supply agency? N/a Were you able to get the supplies/equipment? not applicable Do you have any questions related to the use of the equipment or supplies? No  Functional Questionnaire: (I = Independent and D = Dependent) ADLs: D  Bathing/Dressing- D  Meal Prep- D  Eating- D  Maintaining continence- D  Transferring/Ambulation- D  Managing Meds- D   Follow up appointments reviewed:  PCP Hospital f/u appt confirmed? No   Specialist Hospital f/u appt confirmed? Yes  Scheduled to see Dr Carlis Abbott (Gen Surg w/ [facility]) on 07/17/22 at 3:15  Are transportation arrangements needed? No  If their condition worsens, is the pt aware to call PCP or go to the Emergency  Dept.? Yes Was the patient provided with contact information for the PCP's office or ED? Yes Was to pt encouraged to call back with questions or concerns? Yes  SDOH assessments and interventions completed:   Yes  Care Coordination Interventions Activated:  Yes   Care Coordination Interventions:  PCP follow up appointment requested [facility] for Care Coordination Services:  RN Care Coordinator Message sent to PCP and PCP's nurse regarding worsening weakness    Encounter Outcome:  Pt. Visit Completed    Chong Sicilian, BSN, RN-BC RN Care Coordinator [place]: [phone] Main #: [phone]

## 2022-07-10 ENCOUNTER — Ambulatory Visit: Payer: Self-pay | Admitting: *Deleted

## 2022-07-10 DIAGNOSIS — H35033 Hypertensive retinopathy, bilateral: Secondary | ICD-10-CM | POA: Insufficient documentation

## 2022-07-10 NOTE — Patient Outreach (Signed)
  Care Coordination   Initial outreach attempt/re admission  Visit Note   07/10/2022 Name: Randy Thomas MRN: [id number] DOB: [birth year]  Randy Thomas is a 86 y.o. year old male who sees Randy Thomas, Randy Mcgee, MD for primary care. I  spoke with his wife and daughter when outreached to follow up after transition of care was completed on 07/07/22  What matters to the patients health and wellness today?  Randy Randy Thomas reports Mr Randy Thomas has been re admitted to [place] [facility] on 07/09/22 after a blood sugar of 29 was noted. She reported he was not able to stand, was losing weight, had a poor appetite.   Randy Thomas continued to share (>2+ minutes) that she is not hopeful that he may discharge back home.  Empathy and encouragement provided She agrees to a future outreach pending patient discharge home  Started case discussion template for patient ( 8 admissions this year)    Goals Addressed               This Visit's Progress     Patient Stated     manage recurrent ascites leading to re admissions [facility]) (pt-stated)   Not on track     Care Coordination Interventions: Evaluation of current treatment plan related to recurrent ascites, re admissions and patient's adherence to plan as established by provider Discussed plans with patient for ongoing care management follow up and provided patient with direct contact information for care management team Empathy and encouragement provided to Randy Thomas Randy Thomas case discussion for patient - 8 admission in a year Education sent in his after visit summary on Ascites        SDOH assessments and interventions completed:  No     Care Coordination Interventions Activated:  Yes  Care Coordination Interventions:  Yes, provided   Follow up plan: Follow up call scheduled for 07/17/22 1030    Encounter Outcome:  Pt. Visit Completed   Randy Marcelli L. Lavina Hamman, RN, BSN, [place] Coordinator Office number  [phone]

## 2022-07-10 NOTE — Patient Instructions (Addendum)
Visit Information  Thank you for taking time to visit with me today. Please don't hesitate to contact me if I can be of assistance to you.   Following are the goals we discussed today:   Goals Addressed               This Visit's Progress     Patient Stated     manage recurrent ascites leading to re admissions [facility]) (pt-stated)   Not on track     Care Coordination Interventions: Evaluation of current treatment plan related to recurrent ascites, re admissions and patient's adherence to plan as established by provider Discussed plans with patient for ongoing care management follow up and provided patient with direct contact information for care management team Empathy and encouragement provided to Randy [place] case discussion for patient - 8 admission in a year Education sent in his after visit summary on Ascites        Our next appointment is by telephone on 07/17/22 at 1030  Please call the care guide team at [phone] if you need to cancel or reschedule your appointment.   If you are experiencing a Mental Health or [place] or need someone to talk to, please call the Suicide and Crisis Lifeline: 988 call the [place] National Suicide Prevention Lifeline: 949-012-1318 or TTY: 347-103-7190 TTY 631-602-2064) to talk to a trained counselor call 1-800-273-TALK (toll free, 24 hour hotline) go to [facility] [address] [place] [phone]) call the [place]: [phone] call 911   Patient verbalizes understanding of instructions and care plan provided today and agrees to view in [place]. Active MyChart status and patient understanding of how to access instructions and care plan via MyChart confirmed with patient.     The patient has been provided with contact information for the care management team and has been advised to call with any health related questions or  concerns.   Randy Cesaro L. Lavina Hamman, RN, BSN, [place] Coordinator Office number [phone]

## 2022-07-16 DEATH — deceased

## 2022-07-17 ENCOUNTER — Encounter: Payer: Self-pay | Admitting: *Deleted
